# Patient Record
Sex: Female | Born: 1975 | Race: Black or African American | Hispanic: No | Smoking: Never smoker
Health system: Southern US, Community
[De-identification: ages and names within clinical notes are randomized; demographics above are authoritative.]

## PROBLEM LIST (undated history)

## (undated) ENCOUNTER — Inpatient Hospital Stay (HOSPITAL_COMMUNITY): Payer: BLUE CROSS/BLUE SHIELD

## (undated) DIAGNOSIS — I10 Essential (primary) hypertension: Secondary | ICD-10-CM

## (undated) DIAGNOSIS — R7303 Prediabetes: Secondary | ICD-10-CM

## (undated) HISTORY — DX: Morbid (severe) obesity due to excess calories: E66.01

## (undated) HISTORY — DX: Prediabetes: R73.03

## (undated) HISTORY — PX: DILATION AND CURETTAGE, DIAGNOSTIC / THERAPEUTIC: SUR384

## (undated) HISTORY — PX: COLONOSCOPY: SHX174

---

## 1998-05-12 ENCOUNTER — Emergency Department (HOSPITAL_COMMUNITY): Admission: EM | Admit: 1998-05-12 | Discharge: 1998-05-12 | Payer: Self-pay | Admitting: Emergency Medicine

## 1998-09-24 ENCOUNTER — Other Ambulatory Visit: Admission: RE | Admit: 1998-09-24 | Discharge: 1998-09-24 | Payer: Self-pay | Admitting: Obstetrics

## 1999-03-04 ENCOUNTER — Other Ambulatory Visit: Admission: RE | Admit: 1999-03-04 | Discharge: 1999-03-04 | Payer: Self-pay | Admitting: Obstetrics

## 1999-08-02 ENCOUNTER — Inpatient Hospital Stay (HOSPITAL_COMMUNITY): Admission: AD | Admit: 1999-08-02 | Discharge: 1999-08-02 | Payer: Self-pay | Admitting: Obstetrics

## 1999-09-11 ENCOUNTER — Inpatient Hospital Stay (HOSPITAL_COMMUNITY): Admission: AD | Admit: 1999-09-11 | Discharge: 1999-09-11 | Payer: Self-pay | Admitting: Obstetrics

## 1999-09-12 ENCOUNTER — Inpatient Hospital Stay (HOSPITAL_COMMUNITY): Admission: AD | Admit: 1999-09-12 | Discharge: 1999-09-15 | Payer: Self-pay | Admitting: Obstetrics

## 2004-06-04 ENCOUNTER — Ambulatory Visit (HOSPITAL_COMMUNITY): Admission: AD | Admit: 2004-06-04 | Discharge: 2004-06-04 | Payer: Self-pay | Admitting: Obstetrics and Gynecology

## 2004-06-04 ENCOUNTER — Encounter (INDEPENDENT_AMBULATORY_CARE_PROVIDER_SITE_OTHER): Payer: Self-pay | Admitting: Specialist

## 2004-12-05 HISTORY — PX: TUBAL LIGATION: SHX77

## 2004-12-18 ENCOUNTER — Inpatient Hospital Stay (HOSPITAL_COMMUNITY): Admission: AD | Admit: 2004-12-18 | Discharge: 2004-12-18 | Payer: Self-pay | Admitting: Obstetrics and Gynecology

## 2005-01-27 ENCOUNTER — Other Ambulatory Visit: Admission: RE | Admit: 2005-01-27 | Discharge: 2005-01-27 | Payer: Self-pay | Admitting: Obstetrics and Gynecology

## 2005-08-12 ENCOUNTER — Inpatient Hospital Stay (HOSPITAL_COMMUNITY): Admission: RE | Admit: 2005-08-12 | Discharge: 2005-08-15 | Payer: Self-pay | Admitting: Obstetrics and Gynecology

## 2005-08-12 ENCOUNTER — Encounter (INDEPENDENT_AMBULATORY_CARE_PROVIDER_SITE_OTHER): Payer: Self-pay | Admitting: Specialist

## 2007-03-17 ENCOUNTER — Emergency Department (HOSPITAL_COMMUNITY): Admission: EM | Admit: 2007-03-17 | Discharge: 2007-03-17 | Payer: Self-pay | Admitting: Family Medicine

## 2007-09-12 ENCOUNTER — Emergency Department (HOSPITAL_COMMUNITY): Admission: EM | Admit: 2007-09-12 | Discharge: 2007-09-12 | Payer: Self-pay | Admitting: Emergency Medicine

## 2010-01-19 ENCOUNTER — Emergency Department (HOSPITAL_COMMUNITY): Admission: EM | Admit: 2010-01-19 | Discharge: 2010-01-19 | Payer: Self-pay | Admitting: Family Medicine

## 2010-10-04 ENCOUNTER — Encounter (INDEPENDENT_AMBULATORY_CARE_PROVIDER_SITE_OTHER): Payer: Self-pay | Admitting: *Deleted

## 2010-10-04 LAB — CONVERTED CEMR LAB
AST: 13 units/L (ref 0–37)
Alkaline Phosphatase: 71 units/L (ref 39–117)
BUN: 8 mg/dL (ref 6–23)
Basophils Absolute: 0 10*3/uL (ref 0.0–0.1)
CO2: 27 meq/L (ref 19–32)
Chloride: 104 meq/L (ref 96–112)
Eosinophils Absolute: 0.3 10*3/uL (ref 0.0–0.7)
Eosinophils Relative: 3 % (ref 0–5)
Glucose, Bld: 86 mg/dL (ref 70–99)
Hemoglobin: 13.6 g/dL (ref 12.0–15.0)
LDL Cholesterol: 85 mg/dL (ref 0–99)
Lymphs Abs: 3.2 10*3/uL (ref 0.7–4.0)
MCHC: 35.4 g/dL (ref 30.0–36.0)
Monocytes Relative: 9 % (ref 3–12)
Neutro Abs: 7.2 10*3/uL (ref 1.7–7.7)
RDW: 14.3 % (ref 11.5–15.5)
Sodium: 139 meq/L (ref 135–145)
TSH: 1.31 microintl units/mL (ref 0.350–4.500)
Total Protein: 7.3 g/dL (ref 6.0–8.3)
Triglycerides: 78 mg/dL (ref <150)

## 2010-11-08 ENCOUNTER — Encounter (INDEPENDENT_AMBULATORY_CARE_PROVIDER_SITE_OTHER): Payer: Self-pay | Admitting: *Deleted

## 2011-02-23 LAB — POCT I-STAT, CHEM 8
BUN: 6 mg/dL (ref 6–23)
Calcium, Ion: 1.2 mmol/L (ref 1.12–1.32)
Chloride: 105 mEq/L (ref 96–112)
HCT: 44 % (ref 36.0–46.0)
Hemoglobin: 15 g/dL (ref 12.0–15.0)
TCO2: 29 mmol/L (ref 0–100)

## 2011-04-22 NOTE — Discharge Summary (Signed)
Monica Brennan, Monica Brennan NO.:  [id number]   MEDICAL RECORD NO.:  [id number]          PATIENT TYPE:  INP   LOCATION:  9102                          FACILITY:  [facility]   PHYSICIAN:  Malva Limes, M.D.    DATE OF BIRTH:  1976-06-29   DATE OF ADMISSION:  08/12/2005  DATE OF DISCHARGE:  08/15/2005                                 DISCHARGE SUMMARY   FINAL DIAGNOSIS:  Intrauterine pregnancy at term with mild preeclampsia,  history of previous cesarean sections x2, desires permanent sterilization,  and obesity.   PROCEDURE:  Repeat low transverse cesarean section and P[place] tubal  sterilization procedure. Surgeon was Dr. Annamaria Helling.   COMPLICATIONS:  None.   HISTORY OF PRESENT ILLNESS:  This 35 year old gravida 4, para 2, presents to  the office on September 8 with findings consistent with mild preeclampsia.  The patient had a repeat cesarean section scheduled on September 13, but  because of her mild preeclampsia she was admitted at this time. Labs were  ordered and she was taken to the operating room for cesarean section at this  time.   HOSPITAL COURSE:  The patient was taken to the operating room on August 12, 2005 by Dr. Annamaria Helling where a repeat low transverse cesarean section  was performed with the delivery of a 6 pounds 11 ounces female infant with  Apgars of 9 and 9. At this point [facility] tubal sterilization was performed  for permanent sterilization. The patient's delivery and tubal without  complications. Her postoperative course was benign without any significant  fevers. She was felt ready for discharge on postoperative day #3. She was  sent home on a regular diet, told to decrease activities, told to continue  her prenatal vitamins, was given Tylox one to two every 4 hours as needed  for pain, was to follow up in the office 4 weeks. The patient was also to  call with any headache, blurred vision, or any changes in her status.   LABS ON DISCHARGE:  The  patient had a hemoglobin of 10.6, white blood cell  count of 11.9 and platelets of 202,000. The patient's PIH labs were within  normal range.      Monica Brennan, P.A.-C.    ______________________________  Malva Limes, M.D.    MB/MEDQ  D:  09/28/2005  T:  09/28/2005  Job:  [id number]

## 2011-04-22 NOTE — Op Note (Signed)
Monica Brennan, Monica Brennan             ACCOUNT NO.:  [id number]   MEDICAL RECORD NO.:  [id number]          PATIENT TYPE:  INP   LOCATION:  9102                          [facility]ILITY[facility]  [facility]   PHYSICIAN:  [name]. [name], M.D.   DATE OF BIRTH:  1976/08/02   DATE OF PROCEDURE:  08/12/2005  DATE OF DISCHARGE:                                 OPERATIVE REPORT   The patient is age 35.   PREOPERATIVE DIAGNOSES:  1.  Previous C-section x2 term pregnancy.  2.  Mild preeclampsia.  3.  Desire for sterilization.  4.  Obesity.   POSTOPERATIVE DIAGNOSES:  1.  Previous C-section x2 term pregnancy.  2.  Mild preeclampsia.  3.  Desire for sterilization.  4.  Obesity.  5.  Delivery of viable 6 pound 11 ounces female infant, Apgar's 9 and 9.  6.  Pathology pending on segments of each fallopian tube.   PROCEDURE:  Repeat low transverse cesarean section and Pomeroy tubal  sterilization.   SURGEON:  [name]. [name], M.D.   ANESTHESIA:  Spinal, Dr. [name], M.D.   HISTORY:  This 35 year old gravida 4, para 2 presented to the office today  with findings consistent with mild preeclampsia. She was scheduled for  repeat cesarean section on September 13 and a tubal sterilization at that  time as well. Because of her mild preeclampsia, she was admitted, had  laboratory studies done and is now being taken to the operating room for  delivery by repeat cesarean section and a tubal sterilization procedure  according to her wishes.   The patient understands that a sterilization procedure is meant to be 100%  permanent but unfortunately is not 100% perfect as subsequent pregnancy can  result.   The patient was taken to the operating room where spinal was placed by Dr.  Tacy Dura without difficulty. She was then placed in supine position slightly  tilted to the left. Her abdomen was taped up to the anesthesia guard to  elevate the large panniculus - the patient is of short stature and obese.  Thereafter she  was prepped and draped in the usual fashion and a Foley  catheter was placed as part of the prep.   At this time, good anesthetic levels were documented and procedure was  begun. A Pfannenstiel incision was made and with minimal difficulty  dissected down to and through the fascia with hemostasis created at each  layer. A subfascial space was created inferiorly and superiorly, muscles  separated in the midline, peritoneum identified and entered appropriately  with care taken to avoid the bowel superiorly and the bladder inferiorly. At  this time, the vesicouterine peritoneum was incised in a low transverse  fashion and pushed off the lower uterine segment with ease. Sharp incision  to the uterus with Metzenbaum scissors were then carried out in low  transverse fashion and extended laterally with the fingers. Amniotomy was  produced with production of clear fluid and thereafter a viable female  infant was delivered from a vertex position. Infant cried spontaneously at  once. It was found to weigh 6 pounds 11 ounces.  Apgar's were assigned at 9  and 9. After the cord was clamped and cut, the infant was passed off to the  awaiting team and thereafter taken to the regular nursery in good condition.   After the placenta was delivered intact, the uterus was exteriorized and  rendered free of any remaining products conception. Good uterine  contractility was __________ slowly given intravenous Pitocin and manual  stimulation. 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   Both tubes and ovaries appeared normal and at this time attention was turned  to closing the uterine incision which was carried out using #1 Vicryl in a  running locking fashion, oversewn with several #1 Vicryl in a figure-of-  eight fashion. Hemostasis was now adequate in the uterine incision area. At  this time, attention was turned to each fallopian tube and according to  the  patient's wishes, a Pomeroy tubal sterilization procedure was carried out.   A Babcock clamp was used to elevate a segment in the mid portion of the  right fallopian tube and a free tie of  #1 plain catgut suture tied down  about the knuckle that was elevated and the knuckle was excised and sent to  pathology. Hemostasis was adequate and a similar procedure was carried out  on the left fallopian tube.   At this time, the tubal sites were hemostatic as was the uterine incision.  The uterus was well contracted. The abdomen was lavaged of all free blood  and clot and the uterus was replaced into the abdominal cavity and after all  counts were noted to be correct and no foreign bodies were noted to be  remaining in the abdominal cavity, closure of the abdomen was carried out in  layers. The abdominal peritoneum was closed with #0 Vicryl in a running  fashion and muscle secured with same. Meticulous attention was given to  subfascial hemostasis, and indeed several figure-of-eight #0 Vicryl sutures  were placed in the muscle for hemostasis. Once adequate hemostasis was  achieved, closure of the fascia was carried out using #0 Vicryl from angle  to midline bilaterally. Subcutaneous tissues were hemostatic and staples  were used to close the skin. A sterile pressure dressing was applied. The  patient at this time was transported to the recovery room in satisfactory  condition having tolerated the procedure well. Estimated blood loss 500 mL.  All counts correct x2. Pathologic specimen consisted of a segment of each  fallopian tube.   In summary, this 35 year old gravida 4, para 2, 2 previous cesarean  sections, was scheduled for an elective repeat cesarean section on September  13, but presented to the office today for a routine visit and was noted to  have mild preeclampsia. After normal labs were obtained, a decision was made to proceed with delivering her today rather than waiting until  September 13.  She was delivered of a viable 6 pound 11 ounce female infant with Apgar's of  9 and 9 and a tubal sterilization procedure was carried out according to her  wishes.   The patient was taken to the recovery room in satisfactory condition after  tolerating the procedure well. Estimated blood loss 500 mL. All counts  correct x2.      [name]. [name], M.D.  Electronically Signed     RMW/MEDQ  D:  08/12/2005  T:  08/13/2005  Job:  [id number]

## 2011-04-22 NOTE — Op Note (Signed)
NAME:  Monica Brennan, Monica Brennan NO.:  [id number]   MEDICAL RECORD NO.:  [id number]                   PATIENT TYPE:  MAT   LOCATION:  MATC                                 [facility]ACILITY:  [facility]   PHYSICIAN:  Malva Limes, M.D.                 DATE OF BIRTH:  14-Aug-1976   DATE OF PROCEDURE:  06/04/2004  DATE OF DISCHARGE:                                 OPERATIVE REPORT   PREOPERATIVE DIAGNOSIS:  Incomplete abortion at 37 weeks' estimated  gestational age.   POSTOPERATIVE DIAGNOSIS:  Incomplete abortion at 72 weeks' estimated  gestational age.   PROCEDURE:  Dilation and curettage.   SURGEON:  Malva Limes, M.D.   ANESTHESIA:  MAC with paracervical block.   ESTIMATED BLOOD LOSS:  30 mL.   ANTIBIOTICS:  Ancef 1 g.   COMPLICATIONS:  None.   SPECIMENS:  Products of conception sent to pathology.   PROCEDURE:  The patient was taken to the operating room, where she was  placed in the dorsal lithotomy position.  She was prepped with Hibiclens and  MAC anesthesia was administered.  She was then draped in the usual fashion  for this procedure.  Pelvic exam was then performed, which revealed a uterus  approximately 13-14 weeks in size.  The patient had a sterile speculum  placed in the vagina, 20 mL of 1% lidocaine was used for a paracervical  block.  The cervix was then grasped with a single-tooth tenaculum.  The  cervix was dilated to a 83 [place].  The 10 mm suction cannula was placed in  the uterine cavity and products of conception withdrawn.  Sharp curettage  was then performed followed by a repeat suction.  The patient tolerated the  procedure well.  She was taken to the recovery room in stable condition.  Instrument and lap counts were correct x1.  The patient's blood type was Rh  positive, and therefore no RhoGAM was indicated.  The patient will be  discharged to home on Keflex 500 mg q.i.d. for two days and Darvocet to take  p.r.n.  She is instructed to follow  up in the office in two weeks.                                               Malva Limes, M.D.    MA/MEDQ  D:  06/04/2004  T:  06/07/2004  Job:  [id number]

## 2011-04-22 NOTE — H&P (Signed)
[facility]  Patient:    Monica Brennan                     MRN: [id number] Adm. Date:  [id number] Attending:  Venita Sheffield                         History and Physical  HISTORY OF PRESENT ILLNESS:   The patient is a 35 year old, gravida 2, para 1, whose estimated date of confinement is September 12, 1999.  The patients previous delivery was by way of cesarean section.  Her last infant was in 1998 weighing  pounds 3 ounces at 42 weeks.  The patient was admitted in active labor during which time her labor pattern stalled and was augmented with Pitocin.  After Pitocin augmentation for several hours and no progress was made, the decision was to proceed with a repeat cesarean section.  PHYSICAL EXAMINATION:  GENERAL:                      A well-developed, well-nourished, gravid female in labor-type distress.  HEENT:                        Within normal limits.  NECK:                         Supple.  BREASTS:                      Without masses, tenderness, or discharge.  LUNGS:                        Clear to auscultation and percussion.  HEART:                        Normal sinus rhythm without murmurs, rubs, or gallops.  ABDOMEN:                      Term gravid with fetal heart beats in the left lower quadrant.  EXTREMITIES: NEUROLOGICAL:    Within normal limits.  PELVIC:                       Revealed external genitalia and BUS to be within normal limits.  The vagina is clear.  The cervix is approximately 4 to 5 cm dilated, 75% effaced, and vertex at -1 station.  ADMISSION DIAGNOSES:          1. Intrauterine pregnancy at term.                               2. Previous cesarean section.                               3. Failed vaginal birth after cesarean section.  PLAN:                         Repeat cesarean section. DD:  09/12/99 TD:  09/13/99 Job: [id number]

## 2013-03-08 ENCOUNTER — Encounter: Payer: Self-pay | Admitting: General Surgery

## 2013-03-11 ENCOUNTER — Emergency Department (HOSPITAL_COMMUNITY)
Admission: EM | Admit: 2013-03-11 | Discharge: 2013-03-11 | Disposition: A | Discharge status: Home / Self Care | Payer: Self-pay | Attending: Emergency Medicine | Admitting: Emergency Medicine

## 2013-03-11 ENCOUNTER — Encounter (HOSPITAL_COMMUNITY): Payer: Self-pay | Admitting: Emergency Medicine

## 2013-03-11 DIAGNOSIS — K089 Disorder of teeth and supporting structures, unspecified: Secondary | ICD-10-CM | POA: Insufficient documentation

## 2013-03-11 DIAGNOSIS — N898 Other specified noninflammatory disorders of vagina: Secondary | ICD-10-CM | POA: Insufficient documentation

## 2013-03-11 DIAGNOSIS — B9689 Other specified bacterial agents as the cause of diseases classified elsewhere: Secondary | ICD-10-CM

## 2013-03-11 DIAGNOSIS — A499 Bacterial infection, unspecified: Secondary | ICD-10-CM | POA: Insufficient documentation

## 2013-03-11 DIAGNOSIS — R51 Headache: Secondary | ICD-10-CM

## 2013-03-11 DIAGNOSIS — I10 Essential (primary) hypertension: Secondary | ICD-10-CM | POA: Insufficient documentation

## 2013-03-11 DIAGNOSIS — K029 Dental caries, unspecified: Secondary | ICD-10-CM | POA: Insufficient documentation

## 2013-03-11 DIAGNOSIS — H53149 Visual discomfort, unspecified: Secondary | ICD-10-CM | POA: Insufficient documentation

## 2013-03-11 DIAGNOSIS — N760 Acute vaginitis: Secondary | ICD-10-CM | POA: Insufficient documentation

## 2013-03-11 DIAGNOSIS — K0889 Other specified disorders of teeth and supporting structures: Secondary | ICD-10-CM

## 2013-03-11 HISTORY — DX: Essential (primary) hypertension: I10

## 2013-03-11 LAB — WET PREP, GENITAL
Trich, Wet Prep: NONE SEEN
Yeast Wet Prep HPF POC: NONE SEEN

## 2013-03-11 MED ORDER — OXYCODONE-ACETAMINOPHEN 5-325 MG PO TABS: 2.0000 | ORAL_TABLET | Freq: Once | ORAL | Status: DC

## 2013-03-11 MED ORDER — PENICILLIN V POTASSIUM 500 MG PO TABS
500.0000 mg | ORAL_TABLET | Freq: Once | ORAL | Status: AC
Start: 2013-03-11 — End: 2013-03-11
  Administered 2013-03-11: 500 mg via ORAL
  Filled 2013-03-11: qty 1

## 2013-03-11 MED ORDER — METRONIDAZOLE 500 MG PO TABS: 500.0000 mg | ORAL_TABLET | Freq: Two times a day (BID) | ORAL | Status: DC

## 2013-03-11 MED ORDER — PENICILLIN V POTASSIUM 500 MG PO TABS: 500.0000 mg | ORAL_TABLET | Freq: Four times a day (QID) | ORAL | Status: AC

## 2013-03-11 MED ORDER — METOCLOPRAMIDE HCL 5 MG/ML IJ SOLN
10.0000 mg | Freq: Once | INTRAMUSCULAR | Status: AC
  Administered 2013-03-11: 10 mg via INTRAMUSCULAR
  Filled 2013-03-11: qty 2

## 2013-03-11 MED ORDER — HYDROCODONE-ACETAMINOPHEN 5-325 MG PO TABS: 1.0000 | ORAL_TABLET | ORAL | Status: DC | PRN

## 2013-03-11 MED ORDER — OXYCODONE-ACETAMINOPHEN 5-325 MG PO TABS
1.0000 | ORAL_TABLET | Freq: Once | ORAL | Status: AC
  Administered 2013-03-11: 1 via ORAL
  Filled 2013-03-11: qty 1

## 2013-03-11 NOTE — ED Notes (Signed)
Patient c/o dizziness and HTN (patient has been seen for previously but is not on any medications).  Patient also c/o dental pain.

## 2013-03-11 NOTE — ED Notes (Signed)
HQI:ON62<XB> Expected date:<BR> Expected time:<BR> Means of arrival:<BR> Comments:<BR> Gaynell Face

## 2013-03-11 NOTE — ED Notes (Signed)
Pt c/o headache and htn, as well as dental pin and vaginal discharge and itchiness. Sts no bp medications, reports dizziness and nausea with headache. Pt sts told by pcp no need for bp meds d/t "htn running in her family"

## 2013-03-11 NOTE — ED Provider Notes (Signed)
History     CSN: [id number]  Arrival date & time 03/11/13  1439   First MD Initiated Contact with Patient 03/11/13 1511      Chief Complaint  Patient presents with  . Dental Pain  . Hypertension  . Vaginal Discharge    (Consider location/radiation/quality/duration/timing/severity/associated sxs/prior treatment) HPI The patient reports developing a headache over the past 2-3 days.  She has photophobia without phonophobia.  No recent falls or trauma or injury to her head.  She has not use anticoagulants.  This was not an acute onset headache.  She denies fevers or chills.  She denies weakness of her upper lower extremities.  She also states to 3 weeks of increasing left upper second molar pain.  She has a known dental cavity here but she has not been able to follow up with a dentist.  She denies chest pain shortness of breath.  No abdominal pain.  She's had no new vaginal discharge over the past 2-3 days without vaginal itching.  No lower abdominal pain.  No new sexual contacts.  Her symptoms are mild to moderate in severity.   Past Medical History  Diagnosis Date  . Hypertension     No past surgical history on file.  No family history on file.  History  Substance Use Topics  . Smoking status: Never Smoker   . Smokeless tobacco: Not on file  . Alcohol Use: No    OB History   Grav Para Term Preterm Abortions TAB SAB Ect Mult Living                  Review of Systems  Genitourinary: Positive for vaginal discharge.  All other systems reviewed and are negative.    Allergies  Review of patient's allergies indicates no known allergies.  Home Medications   Current Outpatient Rx  Name  Route  Sig  Dispense  Refill  . acetaminophen (TYLENOL) 500 MG tablet   Oral   Take 500 mg by mouth every 6 (six) hours as needed for pain.         Marland Kitchen HYDROcodone-acetaminophen (NORCO/VICODIN) 5-325 MG per tablet   Oral   Take 1 tablet by mouth every 4 (four) hours as needed for  pain.   15 tablet   0   . metroNIDAZOLE (FLAGYL) 500 MG tablet   Oral   Take 1 tablet (500 mg total) by mouth 2 (two) times daily.   14 tablet   0   . penicillin v potassium (VEETID) 500 MG tablet   Oral   Take 1 tablet (500 mg total) by mouth 4 (four) times daily.   40 tablet   0     BP 160/97  Pulse 70  Temp(Src) 98 F (36.7 C) (Oral)  Resp 16  SpO2 99%  LMP 03/03/2013  Physical Exam  Nursing note and vitals reviewed. Constitutional: She is oriented to person, place, and time. She appears well-developed and well-nourished. No distress.  HENT:  Head: Normocephalic and atraumatic.  Dental decay of her left upper second molar without gingival swelling or fluctuance.  Tolerating secretions.  Oral airway patent.  Eyes: EOM are normal. Pupils are equal, round, and reactive to light.  Neck: Normal range of motion.  Cardiovascular: Normal rate, regular rhythm and normal heart sounds.   Pulmonary/Chest: Effort normal and breath sounds normal.  Abdominal: Soft. She exhibits no distension. There is no tenderness.  Genitourinary:  Normal external genitalia.  No cervical motion tenderness.  Small/scant vaginal discharge  with some odor.  No adnexal masses or fullness.  Musculoskeletal: Normal range of motion.  Neurological: She is alert and oriented to person, place, and time.  5/5 strength in major muscle groups of  bilateral upper and lower extremities. Speech normal. No facial asymetry.   Skin: Skin is warm and dry.  Psychiatric: She has a normal mood and affect. Judgment normal.    ED Course  Procedures (including critical care time)  Labs Reviewed  WET PREP, GENITAL - Abnormal; Notable for the following:    Clue Cells Wet Prep HPF POC RARE (*)    All other components within normal limits  GC/CHLAMYDIA PROBE AMP   No results found.   1. Headache   2. Pain, dental   3. BV (bacterial vaginosis)       MDM  The patient's headache is improved with pain medicine  and Reglan.  Abdomen is benign.  Normal neurologic exam.  Evidence of dental infection that'll be treated with penicillin, pain medicine, temporal for all.  Patient with evidence of bacterial vaginosis.  Home with Flagyl.        Lyanne Co, MD 03/11/13 [phone]

## 2013-03-11 NOTE — Progress Notes (Signed)
This encounter was created in error - please disregard.

## 2013-03-11 NOTE — ED Notes (Signed)
Dr Patria Mane made aware pt's b/p at discharge was 170/100

## 2013-11-30 ENCOUNTER — Encounter (HOSPITAL_COMMUNITY): Payer: Self-pay | Admitting: Emergency Medicine

## 2013-11-30 ENCOUNTER — Emergency Department (HOSPITAL_COMMUNITY)
Admission: EM | Admit: 2013-11-30 | Discharge: 2013-11-30 | Disposition: A | Discharge status: Home / Self Care | Payer: Medicaid Other | Attending: Emergency Medicine | Admitting: Emergency Medicine

## 2013-11-30 DIAGNOSIS — R05 Cough: Secondary | ICD-10-CM | POA: Insufficient documentation

## 2013-11-30 DIAGNOSIS — I10 Essential (primary) hypertension: Secondary | ICD-10-CM | POA: Insufficient documentation

## 2013-11-30 DIAGNOSIS — R062 Wheezing: Secondary | ICD-10-CM | POA: Insufficient documentation

## 2013-11-30 DIAGNOSIS — R059 Cough, unspecified: Secondary | ICD-10-CM | POA: Insufficient documentation

## 2013-11-30 DIAGNOSIS — R6883 Chills (without fever): Secondary | ICD-10-CM | POA: Insufficient documentation

## 2013-11-30 MED ORDER — HYDROCODONE-HOMATROPINE 5-1.5 MG/5ML PO SYRP: 5.0000 mL | ORAL_SOLUTION | Freq: Four times a day (QID) | ORAL | Status: DC | PRN

## 2013-11-30 NOTE — ED Notes (Signed)
MD made aware of admit/discharge BP.  No orders received.  Pt discharged.

## 2013-11-30 NOTE — ED Notes (Signed)
Patient reports cough with wheezing and chills.  Intermittent chest pain for 10 days. She is also reporting some cold sweats.  She is also concerned about a spot on her left breast.  Patient with no s/sx of distress.  Denies any n/v/d.  Patient has had motrin today for pain.

## 2013-12-01 NOTE — ED Provider Notes (Signed)
CSN: [id number]     Arrival date & time 11/30/13  1434 History   First MD Initiated Contact with Patient 11/30/13 2003     Chief Complaint  Patient presents with  . Cough  . Wheezing  . Chills    Patient is a 37 y.o. female presenting with cough and wheezing. The history is provided by the patient.  Cough Cough characteristics:  Productive Sputum characteristics:  Green Severity:  Moderate Onset quality:  Gradual Duration:  1 week Timing:  Intermittent Progression:  Unchanged Relieved by:  Beta-agonist inhaler Worsened by:  Nothing tried Associated symptoms: wheezing   Associated symptoms: no fever   Wheezing Associated symptoms: cough   Associated symptoms: no fever    Pt reports she has had cough for past week She reports chest wall pain from coughing.   She denies any significant SOB at this time She has used her child's albuterol at home with some relief  She also requests to have a "Spot" on her left breast checked  Past Medical History  Diagnosis Date  . Hypertension    History reviewed. No pertinent past surgical history. No family history on file. History  Substance Use Topics  . Smoking status: Never Smoker   . Smokeless tobacco: Not on file  . Alcohol Use: No   OB History   Grav Para Term Preterm Abortions TAB SAB Ect Mult Living                 Review of Systems  Constitutional: Negative for fever.  Respiratory: Positive for cough and wheezing.   Cardiovascular:       CP with cough   All other systems reviewed and are negative.    Allergies  Review of patient's allergies indicates no known allergies.  Home Medications   Current Outpatient Rx  Name  Route  Sig  Dispense  Refill  . HYDROcodone-homatropine (HYCODAN) 5-1.5 MG/5ML syrup   Oral   Take 5 mLs by mouth every 6 (six) hours as needed for cough.   120 mL   0    BP 156/105  Pulse 66  Temp(Src) 98.2 F (36.8 C) (Oral)  Resp 20  SpO2 99% Physical Exam CONSTITUTIONAL: Well  developed/well nourished HEAD: Normocephalic/atraumatic EYES: EOMI/PERRL ENMT: Mucous membranes moist NECK: supple no meningeal signs SPINE:entire spine nontender CV: S1/S2 noted, no murmurs/rubs/gallops noted Chest - left breast - no mass/erythema noted.  Family at bedside at patient request LUNGS: Lungs are clear to auscultation bilaterally, no apparent distress ABDOMEN: soft, nontender, no rebound or guarding GU:no cva tenderness NEURO: Pt is awake/alert, moves all extremitiesx4 EXTREMITIES: pulses normal, full ROM SKIN: warm, color normal PSYCH: no abnormalities of mood noted  ED Course  Procedures  Labs Review Labs Reviewed - No data to display Imaging Review No results found.  EKG Interpretation    Date/Time:  Saturday November 30 2013 15:28:40 EST Ventricular Rate:  67 PR Interval:  174 QRS Duration: 78 QT Interval:  398 QTC Calculation: 420 R Axis:   53 Text Interpretation:  Normal sinus rhythm Normal ECG Confirmed by Bebe Shaggy  MD, Shariff Lasky [phone]) on 11/30/2013 8:07:50 PM            MDM   1. Cough    Nursing notes including past medical history and social history reviewed and considered in documentation   Pt well appearing, no distress, lung sounds clear.  Pt likely has viral illness that is resolving She requests meds for her cough.   Also, I  advised her need for f/u breast ultrasound or mammogram for further evaluation of any concern for breast lesions, pt agreeable    Joya Gaskins, MD 12/01/13 [phone]

## 2014-07-31 ENCOUNTER — Other Ambulatory Visit: Payer: Self-pay | Admitting: Obstetrics and Gynecology

## 2014-07-31 DIAGNOSIS — Z1231 Encounter for screening mammogram for malignant neoplasm of breast: Secondary | ICD-10-CM

## 2014-08-12 ENCOUNTER — Ambulatory Visit (HOSPITAL_COMMUNITY)
Admission: RE | Admit: 2014-08-12 | Discharge: 2014-08-12 | Disposition: A | Discharge status: Home / Self Care | Payer: Medicaid Other | Source: Ambulatory Visit | Attending: Obstetrics and Gynecology | Admitting: Obstetrics and Gynecology

## 2014-08-12 ENCOUNTER — Encounter (HOSPITAL_COMMUNITY): Payer: Self-pay

## 2014-08-12 VITALS — BP 172/110 | Temp 98.6°F | Ht 66.0 in | Wt 232.4 lb

## 2014-08-12 DIAGNOSIS — Z1231 Encounter for screening mammogram for malignant neoplasm of breast: Secondary | ICD-10-CM

## 2014-08-12 DIAGNOSIS — N898 Other specified noninflammatory disorders of vagina: Secondary | ICD-10-CM

## 2014-08-12 DIAGNOSIS — Z01419 Encounter for gynecological examination (general) (routine) without abnormal findings: Secondary | ICD-10-CM

## 2014-08-13 ENCOUNTER — Telehealth (HOSPITAL_COMMUNITY): Payer: Self-pay | Admitting: *Deleted

## 2014-08-13 ENCOUNTER — Other Ambulatory Visit: Payer: Self-pay | Admitting: Obstetrics and Gynecology

## 2014-08-13 MED ORDER — NYSTATIN 100000 UNIT/GM EX CREA: 1.0000 "application " | TOPICAL_CREAM | Freq: Two times a day (BID) | CUTANEOUS | Status: DC

## 2014-08-13 NOTE — Patient Instructions (Signed)
Explained to Monica Brennan that [facility] will cover Pap smears and co-testing every 5 years unless has a history of abnormal Pap smears. Let patient know will follow up with her within the next couple weeks with results of Pap smear and wet prep by phone. Told patient that the [place] will follow-up with her in regards to her mammogram results by either letter or phone. Monica Brennan verbalized understanding.

## 2014-08-13 NOTE — Progress Notes (Signed)
Complaints of rash on bilateral breast x 1 year that is itchy.  Pap Smear:  Completed Pap smear today. Last Pap smear was 05/05/2009 and normal. Per patient has no history of an abnormal Pap smear. Last Pap smear result is in EPIC.  Physical exam: Breasts Breasts symmetrical. Bilateral rash like area on breast that's appearance is consistent with yeast. Patient complains of areas itching. No nipple retraction bilateral breasts. No nipple discharge bilateral breasts. No lymphadenopathy. No lumps palpated bilateral breasts. No complaints of pain or tenderness on exam. Patient escorted to mammography for a screening mammogram.         Pelvic/Bimanual   Ext Genitalia No lesions, no swelling and no discharge observed on external genitalia.         Vagina Vagina pink and normal texture. No lesions and thick white vaginal discharge observed with positive whiff test. Wet Prep completed.      Cervix Cervix is present. Cervix pink and of normal texture. Small amount of thick white discharge observed on cervix.      Uterus Uterus is present and palpable. Uterus in normal position and normal size.       Adnexae Bilateral ovaries present and unable to palpate. No tenderness on palpation.        Rectovaginal No rectal exam completed today since patient had no rectal complaints. Small hemorrhoid observed on rectal area.

## 2014-08-13 NOTE — Telephone Encounter (Signed)
Called patient to let her know that a nystatin prescription has been sent to the pharmacy for the yeast on her breasts. Let her know that [facility] doesn't cover the cost of it.  Patient verbalized understanding. Patient informed me that her cell phone has been cut off until Monday and to call her job.

## 2014-08-15 LAB — CYTOLOGY - PAP

## 2014-08-22 ENCOUNTER — Encounter (HOSPITAL_COMMUNITY): Payer: Self-pay

## 2014-08-22 ENCOUNTER — Other Ambulatory Visit (HOSPITAL_COMMUNITY): Payer: Self-pay | Admitting: *Deleted

## 2014-08-22 ENCOUNTER — Telehealth (HOSPITAL_COMMUNITY): Payer: Self-pay | Admitting: *Deleted

## 2014-08-22 DIAGNOSIS — B379 Candidiasis, unspecified: Secondary | ICD-10-CM

## 2014-08-22 MED ORDER — FLUCONAZOLE 150 MG PO TABS: 150.0000 mg | ORAL_TABLET | Freq: Once | ORAL | Status: DC

## 2014-08-22 NOTE — Progress Notes (Signed)
Telephoned patient at home # and left message to return call to [facility] 

## 2014-08-22 NOTE — Telephone Encounter (Signed)
Telephoned patient at work # and discussed negative pap smear results. HPV was negative. Next pap due in 5 years. Advised wet prep did show yeast infection and med was called into patients's pharmacy. Patient voiced understanding.

## 2014-08-28 ENCOUNTER — Telehealth: Payer: Self-pay

## 2014-08-28 NOTE — Telephone Encounter (Signed)
Left message to remind of appointment for 8:45 AM at cancer center.

## 2014-08-29 ENCOUNTER — Telehealth: Payer: Self-pay

## 2014-08-29 ENCOUNTER — Other Ambulatory Visit: Payer: Medicaid Other

## 2014-08-29 ENCOUNTER — Ambulatory Visit (HOSPITAL_BASED_OUTPATIENT_CLINIC_OR_DEPARTMENT_OTHER): Payer: Medicaid Other

## 2014-08-29 VITALS — BP 163/106 | HR 72 | Temp 98.4°F | Resp 18 | Ht 63.0 in | Wt 236.3 lb

## 2014-08-29 DIAGNOSIS — Z0000 Encounter for general adult medical examination without abnormal findings: Secondary | ICD-10-CM

## 2014-08-29 LAB — LIPID PANEL
CHOL/HDL RATIO: 3.4 ratio
CHOLESTEROL: 141 mg/dL (ref 0–200)
HDL: 41 mg/dL (ref >39)
LDL Cholesterol: 86 mg/dL (ref 0–99)
Triglycerides: 69 mg/dL (ref <150)
VLDL: 14 mg/dL (ref 0–40)

## 2014-08-29 LAB — GLUCOSE (CC13): GLUCOSE: 94 mg/dL (ref 70–140)

## 2014-08-29 LAB — HEMOGLOBIN A1C
HEMOGLOBIN A1C: 5.3 % (ref <5.7)
Mean Plasma Glucose: 105 mg/dL (ref <117)

## 2014-08-29 NOTE — Telephone Encounter (Signed)
Left message about appointment and asked to return call.

## 2014-08-29 NOTE — Progress Notes (Signed)
Patient is a new patient to the [facility] program and is currently a [facility] patient effective 08/12/2014.   Clinical Measurements: Patient is 5 foot 3 inches, weight 236.3 lbs, waist circumference 45 inches, and hip circumference 52.5 inches.   Medical History: Patient has no history of high cholesterol or diabetes.Patient does have a history of hypertension. She is presently not taking any medication and has not taken any since had medicaid when last child born in 2006.Patient states that does have a family history of diabetes Per patient no diagnosed history of coronary heart disease, heart attack, heart failure, stroke/TIA, vascular disease or congenital heart defects.   Blood Pressure, Self-measurement: Patient states that goes to [facility] a couple of times a week and checks blood pressure.  Nutrition Assessment: Patient stated that eats 2 fruits every day. Patient states she eats one to 3  servings of vegetables a day. Per patient does not eats 3 or more ounces of whole grains daily. Patient doesn't eat two or more servings of fish weekly. Patient stated that does not like seafood or any kind of fish and was making a face. Patient states she does drink more than 36 ounces or 450 calories of beverages with added sugars weekly. Patient stated she does watch her salt intake.   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 Per patient no vigorous activity.  Smoking Status: Patient had never smoked and is not around smoke.  Quality of Life Assessment: In assessing patient's quality of life she stated that out of the past 30 days that she has felt her Physical health was good all but 2 days. Patient also stated that in the past 30 days that her mental health is not good including stress, depression and problems with emotions for 14 days. Patient did state that out of the past 30 days she felt her physical or mental health had  not kept her from doing her usual activities including self-care, work or recreation.   Plan: Lab work will be done today including a lipid panel, blood glucose, and Hgb A1C. Will call lab results when they are finished. Will discuss Lifestyle program/ community  Program/ health Coaching ([facility]) when call results. Will obtain doctors appointment at [place]. Patient will use risk modifications that discussed for high blood pressure.

## 2014-08-29 NOTE — Telephone Encounter (Signed)
Called [facility] for appointment for patient with blood pressure in [place] 163/106. Appointment made.

## 2014-08-29 NOTE — Patient Instructions (Signed)
Discussed health assessment with patient. Informed patient that she would need to be followed up for blood pressure. She will be called with results of lab work and we will then discussed any further follow up the patient needs. Patient will implement behavior modifications to help lower BP. Patient verbalized understanding.

## 2014-09-01 ENCOUNTER — Telehealth: Payer: Self-pay

## 2014-09-01 ENCOUNTER — Ambulatory Visit: Payer: Medicaid Other | Admitting: Internal Medicine

## 2014-09-01 NOTE — Telephone Encounter (Signed)
Patient returned call and was informed about appointment. Patient stated that did not receive voice mail from Friday or today. Asked patient to call back if arrangements could not be made for appointment today.

## 2014-09-01 NOTE — Telephone Encounter (Signed)
Called Monica Brennan on [place] to see if she had gotten message. Daycare not aware of appointment but are aware of blood pressure problem. Daycare stated that she would be in at 8:30 AM and they would get her to call me. Daycare stated that they would have to find someone to cover and patient rode the bus and would have to find transportation.

## 2014-09-09 ENCOUNTER — Ambulatory Visit: Payer: Self-pay | Attending: Family Medicine | Admitting: Family Medicine

## 2014-09-09 ENCOUNTER — Encounter: Payer: Self-pay | Admitting: Family Medicine

## 2014-09-09 VITALS — BP 158/112 | HR 70 | Temp 98.3°F | Resp 18 | Ht 63.0 in | Wt 233.0 lb

## 2014-09-09 DIAGNOSIS — I10 Essential (primary) hypertension: Secondary | ICD-10-CM | POA: Insufficient documentation

## 2014-09-09 DIAGNOSIS — R21 Rash and other nonspecific skin eruption: Secondary | ICD-10-CM | POA: Insufficient documentation

## 2014-09-09 DIAGNOSIS — Z23 Encounter for immunization: Secondary | ICD-10-CM

## 2014-09-09 DIAGNOSIS — Z6841 Body Mass Index (BMI) 40.0 and over, adult: Secondary | ICD-10-CM | POA: Insufficient documentation

## 2014-09-09 DIAGNOSIS — L299 Pruritus, unspecified: Secondary | ICD-10-CM | POA: Insufficient documentation

## 2014-09-09 DIAGNOSIS — B354 Tinea corporis: Secondary | ICD-10-CM | POA: Insufficient documentation

## 2014-09-09 MED ORDER — FLUCONAZOLE 150 MG PO TABS: 300.0000 mg | ORAL_TABLET | ORAL | Status: DC

## 2014-09-09 MED ORDER — AMLODIPINE BESYLATE 10 MG PO TABS: 10.0000 mg | ORAL_TABLET | Freq: Every day | ORAL | Status: DC

## 2014-09-09 NOTE — Assessment & Plan Note (Signed)
For tinea corporis: OTC benadryl cream just for itch Do not use steroid Diflucan 300 mg weekly for two weeks, repeat course monthly if needed.

## 2014-09-09 NOTE — Assessment & Plan Note (Signed)
For high BP: Low salt diet Regular exercise  Start Norvasc 10 daily

## 2014-09-09 NOTE — Patient Instructions (Signed)
Ms. Appleman,  Thank you for coming in today. It was a pleasure meeting you. I look forward to being your primary doctor.  1. For high BP: Low salt diet Regular exercise  Start Norvasc 10 daily   2. For tinea corporis: OTC benadryl cream just for itch Do not use steroid Diflucan 300 mg weekly for two weeks, repeat course monthly if needed.   F/u in 2 weeks for RN BP check  F/i in 4 weeks with me  Dr. Adrian Blackwater

## 2014-09-09 NOTE — Progress Notes (Signed)
Establish Care Pt stated has Hx HTN not taking medication

## 2014-09-09 NOTE — Progress Notes (Signed)
   Subjective:    Patient ID: Monica Brennan, female    DOB: 01-Apr-1976, 38 y.o.   MRN: [id number] CC: establish care, HTN   HPI  1. HTN: known history of gestational HTN in 2006.  HA off and on. Blurry vision sometimes. Dizzy episode 3 weeks ago. CP last month. R sided, throbbing.  SOB when she walks sometimes. No swelling.    2. Itchy skin rash: x 2-3 months. Tried son topical steroid cream w/o improvement. On arms. Under breast, nipple. Groin, under pannus.                                                                                                                                                                                                                                                                                            Soc Hx: non smoker  Review of Systems As per HPI     Objective:   Physical Exam BP 158/112  Pulse 70  Temp(Src) 98.3 F (36.8 C) (Oral)  Resp 18  Ht 5\' 3"  (1.6 m)  Wt 233 lb (105.688 kg)  BMI 41.28 kg/m2  SpO2 100%  LMP 08/14/2014 BP Readings from Last 3 Encounters:  09/09/14 158/112  08/29/14 163/106  08/12/14 172/110  General appearance: alert, cooperative and no distress Lungs: clear to auscultation bilaterally Heart: regular rate and rhythm, S1, S2 normal, no murmur, click, rub or gallop Extremities: extremities normal, atraumatic, no cyanosis or edema Skin: circular raised, scaly macules on arms, breast around nipples.      Assessment & Plan:

## 2014-09-10 ENCOUNTER — Telehealth: Payer: Self-pay | Admitting: *Deleted

## 2014-09-10 LAB — COMPLETE METABOLIC PANEL WITH GFR
ALK PHOS: 68 U/L (ref 39–117)
ALT: 13 U/L (ref 0–35)
AST: 13 U/L (ref 0–37)
Albumin: 4.1 g/dL (ref 3.5–5.2)
BUN: 11 mg/dL (ref 6–23)
CO2: 25 mEq/L (ref 19–32)
Calcium: 9.2 mg/dL (ref 8.4–10.5)
Chloride: 105 mEq/L (ref 96–112)
Creat: 0.89 mg/dL (ref 0.50–1.10)
GFR, Est Non African American: 81 mL/min
GLUCOSE: 97 mg/dL (ref 70–99)
Potassium: 5.3 mEq/L (ref 3.5–5.3)
Sodium: 140 mEq/L (ref 135–145)
Total Bilirubin: 0.3 mg/dL (ref 0.2–1.2)
Total Protein: 7.4 g/dL (ref 6.0–8.3)

## 2014-09-10 NOTE — Telephone Encounter (Signed)
Pt aware of lab results 

## 2014-09-10 NOTE — Telephone Encounter (Signed)
Message copied by Betti Cruz on Wed Sep 10, 2014  5:13 PM ------      Message from: Boykin Nearing      Created: Wed Sep 10, 2014  9:29 AM       Normal CMP, normal liver and renal function ------

## 2014-09-10 NOTE — Telephone Encounter (Signed)
Message copied by Betti Cruz on Wed Sep 10, 2014  5:11 PM ------      Message from: Boykin Nearing      Created: Wed Sep 10, 2014  9:29 AM       Normal CMP, normal liver and renal function ------

## 2014-09-12 ENCOUNTER — Telehealth: Payer: Self-pay | Admitting: Emergency Medicine

## 2014-09-12 ENCOUNTER — Telehealth: Payer: Self-pay | Admitting: Family Medicine

## 2014-09-12 DIAGNOSIS — B354 Tinea corporis: Secondary | ICD-10-CM

## 2014-09-12 DIAGNOSIS — I10 Essential (primary) hypertension: Secondary | ICD-10-CM

## 2014-09-12 MED ORDER — FLUCONAZOLE 150 MG PO TABS: 300.0000 mg | ORAL_TABLET | ORAL | Status: DC

## 2014-09-12 MED ORDER — AMLODIPINE BESYLATE 10 MG PO TABS: 10.0000 mg | ORAL_TABLET | Freq: Every day | ORAL | Status: DC

## 2014-09-12 NOTE — Telephone Encounter (Signed)
Pt was told that they would send her prescription online to [facility] but they havent received it. Please follow up with pt.

## 2014-09-12 NOTE — Telephone Encounter (Signed)
Attempted to reach pt to inform medication transferred to [facility] but VM full to accept calls.

## 2014-09-23 ENCOUNTER — Ambulatory Visit: Payer: Self-pay | Attending: Family Medicine | Admitting: Family Medicine

## 2014-09-23 ENCOUNTER — Encounter: Payer: Self-pay | Admitting: Family Medicine

## 2014-09-23 VITALS — BP 119/82 | HR 76 | Temp 98.4°F | Resp 18 | Ht 63.0 in | Wt 223.0 lb

## 2014-09-23 DIAGNOSIS — Z299 Encounter for prophylactic measures, unspecified: Secondary | ICD-10-CM

## 2014-09-23 DIAGNOSIS — B354 Tinea corporis: Secondary | ICD-10-CM | POA: Insufficient documentation

## 2014-09-23 DIAGNOSIS — I10 Essential (primary) hypertension: Secondary | ICD-10-CM | POA: Insufficient documentation

## 2014-09-23 DIAGNOSIS — Z23 Encounter for immunization: Secondary | ICD-10-CM

## 2014-09-23 DIAGNOSIS — Z418 Encounter for other procedures for purposes other than remedying health state: Secondary | ICD-10-CM

## 2014-09-23 MED ORDER — CLOTRIMAZOLE 1 % EX CREA: 1.0000 "application " | TOPICAL_CREAM | Freq: Two times a day (BID) | CUTANEOUS | Status: DC

## 2014-09-23 MED ORDER — AMLODIPINE BESYLATE 10 MG PO TABS: 10.0000 mg | ORAL_TABLET | Freq: Every day | ORAL | Status: DC

## 2014-09-23 NOTE — Progress Notes (Signed)
   Subjective:    Patient ID: KALISE FICKETT, female    DOB: Feb 10, 1976, 38 y.o.   MRN: [id number] CC: HTN f/u  HPI 1. CHRONIC HYPERTENSION  Disease Monitoring  Blood pressure range: does not check   Chest pain: no   Dyspnea: no   Claudication: no   Medication compliance: yes  Medication Side Effects  Lightheadedness: no   Urinary frequency: no   Edema: yes, trace LE    Impotence: no   Preventitive Healthcare:  Exercise: yes, walks 30 minutes daily     2. Nipple rash: no improvement with oral diflucan. Chronic rash. Itching.   Soc hx: non smoker  Review of Systems As per HPI     Objective:   Physical Exam BP 119/82  Pulse 76  Temp(Src) 98.4 F (36.9 C) (Oral)  Resp 18  Ht 5\' 3"  (1.6 m)  Wt 223 lb (101.152 kg)  BMI 39.51 kg/m2  SpO2 99%  LMP 09/10/2014 General appearance: alert, cooperative and no distress Lungs: clear to auscultation bilaterally Heart: regular rate and rhythm, S1, S2 normal, no murmur, click, rub or gallop Extremities: edema trace  Skin: thickened scaly rash around nipples        Assessment & Plan:

## 2014-09-23 NOTE — Progress Notes (Signed)
F/U HTN  Stated been feeling good, medicine working

## 2014-09-23 NOTE — Patient Instructions (Signed)
Monica Brennan,  Thank you for coming back in to see me today Excellent BP! At goal of as close to 120/80 as possible. BP should always be < 140/90.  Continue amlodipine 10 mg daily. Continue to exercise. Continue low salt diet.   F/u in one year, sooner if needed.  Have a happy holiday season.   Dr. Adrian Blackwater

## 2014-09-23 NOTE — Assessment & Plan Note (Signed)
A: no change with oral diflucan.  P: D/c diflucan Topical azole

## 2014-09-23 NOTE — Assessment & Plan Note (Signed)
A: well controlled Meds: amlodipine 10 mg daily P: continue current medication regimen.

## 2014-10-06 ENCOUNTER — Encounter: Payer: Self-pay | Admitting: Family Medicine

## 2014-10-24 ENCOUNTER — Ambulatory Visit: Payer: Medicaid Other

## 2014-11-10 ENCOUNTER — Telehealth: Payer: Self-pay

## 2014-11-10 NOTE — Telephone Encounter (Signed)
Tried to call again concerning HTN

## 2014-11-10 NOTE — Telephone Encounter (Signed)
Called to discuss any further risk reduction counseling for blood pressure and BMI:41.9 through [facility] or lifestyle program. Will call again if patient does not return call.

## 2014-11-26 ENCOUNTER — Telehealth: Payer: Self-pay

## 2014-11-26 NOTE — Telephone Encounter (Signed)
Patient returned call. Patient stated that had received bills and wondered about them. When discussed with patient about weight loss and options with [facility] patient stated that the [facility] sounded like something she could do. We set appointment for Health Coaching on January 14th at 11 AM.

## 2014-12-18 ENCOUNTER — Ambulatory Visit: Payer: Self-pay

## 2014-12-18 DIAGNOSIS — Z789 Other specified health status: Secondary | ICD-10-CM

## 2014-12-18 NOTE — Progress Notes (Signed)
Patient returns today for Health Coaching regarding Hypertension, Activity and Nutrition for her Very Obese BMI.    HYPERTENSION: Per patient went to [facility] on January 10th. Per patient they gave Norvasc 10 mg every day for BP.Last BP at doctor's office was 118/82. Discussed hypertension: cause, effects, treatment, healthy weight, eating right and medication. Went over and gave handout concerning ten ways to decrease salt in your life.  NUTRITION:  Reviewed patient lab results, BP,and other numbers.Patient viewed BMI chart to see that she is considered very obese. Patient reviewed and did assessment in [facility] Explained how the [facility] book is suppose to be used..Discussed increasing fiber in diet, reading labels and measuring serving sizes. Patient went over 1,600 cal diet and we broke it down to the number of servings she could have. Patient received and reviewed the following handouts: [facility] Cookbook, 1600 calorie meal plan, and [facility] Program notebook. Gave patient a water bottle, snack container, magnets and measuring cup to measure serving sizes. I demonstrated the serving sizes with measuring cup.    ACTIVITY:  Discussed different type of activities and locations. Went over Exercise/Activity Go 4 Life booklet. Patient received a pedometer and demonstrated how to use.  MISCELLANEOUS: Patient had voiced that will be getting insurance through the [facility] in  A month or two.  PLAN: . Increase walking time. Review handouts and follow at home. Follow 1600 calorie meal plan.Patient will call if has any questions.Will call patient for Health Coaching follow up.

## 2014-12-18 NOTE — Patient Instructions (Signed)
Patient will follow 1600 calorie diet plan. Will review all handouts and exercise/activity book. Will increase exercise and use pedometer. Will measure portion sizes. Will call if has any questions. Will call patient in three weeks. Patient verbalized understanding.

## 2015-01-07 ENCOUNTER — Other Ambulatory Visit: Payer: Self-pay | Admitting: Family Medicine

## 2015-04-22 ENCOUNTER — Telehealth: Payer: Self-pay

## 2015-04-22 NOTE — Telephone Encounter (Signed)
Called to follow up on goals with [facility] and left message for patient to return call.

## 2015-07-07 ENCOUNTER — Telehealth: Payer: Self-pay

## 2015-07-07 NOTE — Telephone Encounter (Signed)
Called patient to follow up. Someone answered the phone with a crying baby in the background and hung up.

## 2015-08-06 ENCOUNTER — Telehealth: Payer: Self-pay

## 2015-08-06 NOTE — Telephone Encounter (Addendum)
This is the third time that have called patient for follow up Health Coaching and about HTN. Patient has not been back to [facility] since 09/23/14 or [facility] since 08/12/14. If do not hear back in few days will send certified letter to find out status of patient with [facility].

## 2015-08-11 NOTE — Progress Notes (Signed)
Certified letter sent in response to no return calls from patient.

## 2015-08-26 NOTE — Progress Notes (Signed)
Received reply back to certified letter. States problem is received medical bill and work. I attempted to contact today per work phone and was in class. Left message and will attempt to contact again concerning not been to [facility] and need mammogram.

## 2015-08-28 ENCOUNTER — Encounter: Payer: Self-pay | Admitting: Family Medicine

## 2015-08-28 ENCOUNTER — Ambulatory Visit: Payer: No Typology Code available for payment source | Attending: Family Medicine | Admitting: Family Medicine

## 2015-08-28 ENCOUNTER — Other Ambulatory Visit: Payer: Self-pay | Admitting: Family Medicine

## 2015-08-28 VITALS — BP 126/80 | HR 83 | Temp 99.0°F | Resp 16 | Ht 65.0 in | Wt 233.0 lb

## 2015-08-28 DIAGNOSIS — N951 Menopausal and female climacteric states: Secondary | ICD-10-CM | POA: Diagnosis present

## 2015-08-28 DIAGNOSIS — I10 Essential (primary) hypertension: Secondary | ICD-10-CM | POA: Diagnosis not present

## 2015-08-28 DIAGNOSIS — B354 Tinea corporis: Secondary | ICD-10-CM | POA: Diagnosis not present

## 2015-08-28 DIAGNOSIS — Z114 Encounter for screening for human immunodeficiency virus [HIV]: Secondary | ICD-10-CM | POA: Diagnosis not present

## 2015-08-28 DIAGNOSIS — Z0000 Encounter for general adult medical examination without abnormal findings: Secondary | ICD-10-CM | POA: Insufficient documentation

## 2015-08-28 LAB — CBC
HCT: 39.5 % (ref 36.0–46.0)
Hemoglobin: 13.7 g/dL (ref 12.0–15.0)
MCH: 28.4 pg (ref 26.0–34.0)
MCHC: 34.7 g/dL (ref 30.0–36.0)
MCV: 81.8 fL (ref 78.0–100.0)
MPV: 8.6 fL (ref 8.6–12.4)
Platelets: 366 10*3/uL (ref 150–400)
RBC: 4.83 MIL/uL (ref 3.87–5.11)
RDW: 15.5 % (ref 11.5–15.5)
WBC: 8.2 10*3/uL (ref 4.0–10.5)

## 2015-08-28 MED ORDER — CLOTRIMAZOLE 1 % EX CREA: 1.0000 "application " | TOPICAL_CREAM | Freq: Two times a day (BID) | CUTANEOUS | Status: DC

## 2015-08-28 MED ORDER — AMLODIPINE BESYLATE 10 MG PO TABS: 10.0000 mg | ORAL_TABLET | Freq: Every day | ORAL | Status: DC

## 2015-08-28 NOTE — Progress Notes (Signed)
F/U HTN  Medication refills

## 2015-08-28 NOTE — Patient Instructions (Addendum)
Ms. Montesano,  Thank you for coming in today.  Great job with your blood pressure and healthcare maintenance.  Yaret was seen today for hypertension.  Diagnoses and all orders for this visit:  Menopausal symptoms -     CBC -     FSH/LH  Screening for HIV (human immunodeficiency virus) -     HIV antibody (with reflex)  Essential hypertension -     amLODipine (NORVASC) 10 MG tablet; Take 1 tablet (10 mg total) by mouth daily.  Tinea corporis -     clotrimazole (LOTRIMIN) 1 % cream; Apply 1 application topically 2 (two) times daily. Apply to skin rash  Other orders -     Flu Vaccine QUAD 36+ mos IM   F/u in 4-6 weeks for pelvic exam to check for fibroids  Dr. Adrian Blackwater

## 2015-08-28 NOTE — Progress Notes (Signed)
   Subjective:  Patient ID: Monica Brennan, female    DOB: Mar 18, 1976  Age: 39 y.o. MRN: [id number]  CC: Hypertension  HPI Monica Brennan presents for   1. CHRONIC HYPERTENSION  Disease Monitoring  Chest pain: no   Dyspnea: no   Claudication: no   Medication compliance: yes  Medication Side Effects  Lightheadedness: yes   Urinary frequency: no   Edema: no   Impotence: no   2.  Menopausal symptoms: having heavy bleeding x one year with clots. Mom with hx of fibroids. Mom had hysterectomy in early 31s. Having hot flashes for 6 months.   Outpatient Prescriptions Prior to Visit  Medication Sig Dispense Refill  . amLODipine (NORVASC) 10 MG tablet Take 1 tablet (10 mg total) by mouth daily. 90 tablet 2  . clotrimazole (LOTRIMIN) 1 % cream Apply 1 application topically 2 (two) times daily. Apply to skin rash 30 g 2   No facility-administered medications prior to visit.    ROS Review of Systems  Constitutional: Negative for fever and chills.  Eyes: Negative for visual disturbance.  Respiratory: Negative for shortness of breath.   Cardiovascular: Negative for chest pain.  Gastrointestinal: Negative for abdominal pain and blood in stool.  Endocrine: Positive for heat intolerance.       Hot flashes   Genitourinary: Positive for menstrual problem.  Musculoskeletal: Negative for back pain and arthralgias.  Skin: Negative for rash.  Allergic/Immunologic: Negative for immunocompromised state.  Hematological: Negative for adenopathy. Does not bruise/bleed easily.  Psychiatric/Behavioral: Negative for suicidal ideas and dysphoric mood.  GAD-7: 2. 1-2,3. All others 3   Objective:  BP 126/80 mmHg  Pulse 83  Temp(Src) 99 F (37.2 C) (Oral)  Resp 16  Ht 5\' 5"  (1.651 m)  Wt 233 lb (105.688 kg)  BMI 38.77 kg/m2  SpO2 97%  LMP 08/18/2015  BP/Weight 08/28/2015 09/23/2014 12/11/5100  Systolic BP 585 277 824  Diastolic BP 80 82 235  Wt. (Lbs) 233 223 233  BMI 38.77 39.51  41.28   Physical Exam  Constitutional: She is oriented to person, place, and time. She appears well-developed and well-nourished. No distress.  Obese   HENT:  Head: Normocephalic and atraumatic.  Cardiovascular: Normal rate, regular rhythm, normal heart sounds and intact distal pulses.   Pulmonary/Chest: Effort normal and breath sounds normal.  Musculoskeletal: She exhibits no edema.  Neurological: She is alert and oriented to person, place, and time.  Skin: Skin is warm and dry. Rash (plaques around nipples ) noted.  Psychiatric: She has a normal mood and affect.     Assessment & Plan:   Problem List Items Addressed This Visit    Hypertension (Chronic)   Relevant Medications   amLODipine (NORVASC) 10 MG tablet   Menopausal symptoms - Primary   Relevant Orders   CBC   FSH/LH   Tinea corporis   Relevant Medications   clotrimazole (LOTRIMIN) 1 % cream    Other Visit Diagnoses    Screening for HIV (human immunodeficiency virus)        Relevant Orders    HIV antibody (with reflex)    Healthcare maintenance        Relevant Orders    Flu Vaccine QUAD 36+ mos IM (Completed)       No orders of the defined types were placed in this encounter.    Follow-up: No Follow-up on file.   Boykin Nearing MD

## 2015-08-29 LAB — FSH/LH
FSH: 6.1 m[IU]/mL
LH: 4.1 m[IU]/mL

## 2015-08-29 LAB — HIV ANTIBODY (ROUTINE TESTING W REFLEX): HIV 1&2 Ab, 4th Generation: NONREACTIVE

## 2015-09-29 ENCOUNTER — Telehealth: Payer: Self-pay | Admitting: *Deleted

## 2015-09-29 ENCOUNTER — Other Ambulatory Visit: Payer: Medicaid Other | Admitting: Family Medicine

## 2015-09-29 NOTE — Telephone Encounter (Signed)
LVM to return call.

## 2015-09-29 NOTE — Telephone Encounter (Signed)
-----   Message from Boykin Nearing, MD sent at 08/31/2015  8:19 AM EDT ----- Screening HIV negative CBC normal Hormone levels normal

## 2015-09-30 NOTE — Telephone Encounter (Signed)
Pt. Returned call. Please f/u with pt. °

## 2015-09-30 NOTE — Telephone Encounter (Signed)
Date of birth verified by pt Normal lab results-CBC and hormon levels  Negative HIV Pt verbalize understanding

## 2016-01-05 MED FILL — ?AMLODIPINE BESYLATE 10 MG: 10 | 30 days supply | Qty: 30 | Fill #2

## 2016-01-05 MED FILL — CLOTRIMAZOLE 1% CREAM: 1 | 30 days supply | Qty: 28 | Fill #1

## 2016-02-12 MED FILL — AMLODIPINE BESYLATE 10 MG T: 10 | 30 days supply | Qty: 30 | Fill #3

## 2016-03-27 ENCOUNTER — Encounter (HOSPITAL_COMMUNITY): Payer: Self-pay | Admitting: Family Medicine

## 2016-03-27 ENCOUNTER — Emergency Department (HOSPITAL_COMMUNITY): Payer: BLUE CROSS/BLUE SHIELD

## 2016-03-27 ENCOUNTER — Emergency Department (HOSPITAL_COMMUNITY)
Admission: EM | Admit: 2016-03-27 | Discharge: 2016-03-27 | Disposition: A | Discharge status: Home / Self Care | Payer: BLUE CROSS/BLUE SHIELD | Attending: Emergency Medicine | Admitting: Emergency Medicine

## 2016-03-27 DIAGNOSIS — Z79899 Other long term (current) drug therapy: Secondary | ICD-10-CM | POA: Insufficient documentation

## 2016-03-27 DIAGNOSIS — R5381 Other malaise: Secondary | ICD-10-CM | POA: Insufficient documentation

## 2016-03-27 DIAGNOSIS — I10 Essential (primary) hypertension: Secondary | ICD-10-CM | POA: Diagnosis not present

## 2016-03-27 DIAGNOSIS — R059 Cough, unspecified: Secondary | ICD-10-CM

## 2016-03-27 DIAGNOSIS — J069 Acute upper respiratory infection, unspecified: Secondary | ICD-10-CM

## 2016-03-27 DIAGNOSIS — M791 Myalgia: Secondary | ICD-10-CM | POA: Insufficient documentation

## 2016-03-27 DIAGNOSIS — R05 Cough: Secondary | ICD-10-CM | POA: Diagnosis present

## 2016-03-27 NOTE — Discharge Instructions (Signed)
Continue symptomatic treatment with your OTC medications. Return to ED with new, worsening or concerning symptoms.    Upper Respiratory Infection, Adult Most upper respiratory infections (URIs) are a viral infection of the air passages leading to the lungs. A URI affects the nose, throat, and upper air passages. The most common type of URI is nasopharyngitis and is typically referred to as "the common cold." URIs run their course and usually go away on their own. Most of the time, a URI does not require medical attention, but sometimes a bacterial infection in the upper airways can follow a viral infection. This is called a secondary infection. Sinus and middle ear infections are common types of secondary upper respiratory infections. Bacterial pneumonia can also complicate a URI. A URI can worsen asthma and chronic obstructive pulmonary disease (COPD). Sometimes, these complications can require emergency medical care and may be life threatening.  CAUSES Almost all URIs are caused by viruses. A virus is a type of germ and can spread from one person to another.  RISKS FACTORS You may be at risk for a URI if:   You smoke.   You have chronic heart or lung disease.  You have a weakened defense (immune) system.   You are very young or very old.   You have nasal allergies or asthma.  You work in crowded or poorly ventilated areas.  You work in health care facilities or schools. SIGNS AND SYMPTOMS  Symptoms typically develop 2-3 days after you come in contact with a cold virus. Most viral URIs last 7-10 days. However, viral URIs from the influenza virus (flu virus) can last 14-18 days and are typically more severe. Symptoms may include:   Runny or stuffy (congested) nose.   Sneezing.   Cough.   Sore throat.   Headache.   Fatigue.   Fever.   Loss of appetite.   Pain in your forehead, behind your eyes, and over your cheekbones (sinus pain).  Muscle aches.  DIAGNOSIS    Your health care provider may diagnose a URI by:  Physical exam.  Tests to check that your symptoms are not due to another condition such as:  Strep throat.  Sinusitis.  Pneumonia.  Asthma. TREATMENT  A URI goes away on its own with time. It cannot be cured with medicines, but medicines may be prescribed or recommended to relieve symptoms. Medicines may help:  Reduce your fever.  Reduce your cough.  Relieve nasal congestion. HOME CARE INSTRUCTIONS   Take medicines only as directed by your health care provider.   Gargle warm saltwater or take cough drops to comfort your throat as directed by your health care provider.  Use a warm mist humidifier or inhale steam from a shower to increase air moisture. This may make it easier to breathe.  Drink enough fluid to keep your urine clear or pale yellow.   Eat soups and other clear broths and maintain good nutrition.   Rest as needed.   Return to work when your temperature has returned to normal or as your health care provider advises. You may need to stay home longer to avoid infecting others. You can also use a face mask and careful hand washing to prevent spread of the virus.  Increase the usage of your inhaler if you have asthma.   Do not use any tobacco products, including cigarettes, chewing tobacco, or electronic cigarettes. If you need help quitting, ask your health care provider. PREVENTION  The best way to protect yourself  from getting a cold is to practice good hygiene.   Avoid oral or hand contact with people with cold symptoms.   Wash your hands often if contact occurs.  There is no clear evidence that vitamin C, vitamin E, echinacea, or exercise reduces the chance of developing a cold. However, it is always recommended to get plenty of rest, exercise, and practice good nutrition.  SEEK MEDICAL CARE IF:   You are getting worse rather than better.   Your symptoms are not controlled by medicine.   You  have chills.  You have worsening shortness of breath.  You have brown or red mucus.  You have yellow or brown nasal discharge.  You have pain in your face, especially when you bend forward.  You have a fever.  You have swollen neck glands.  You have pain while swallowing.  You have white areas in the back of your throat. SEEK IMMEDIATE MEDICAL CARE IF:   You have severe or persistent:  Headache.  Ear pain.  Sinus pain.  Chest pain.  You have chronic lung disease and any of the following:  Wheezing.  Prolonged cough.  Coughing up blood.  A change in your usual mucus.  You have a stiff neck.  You have changes in your:  Vision.  Hearing.  Thinking.  Mood. MAKE SURE YOU:   Understand these instructions.  Will watch your condition.  Will get help right away if you are not doing well or get worse.   This information is not intended to replace advice given to you by your health care provider. Make sure you discuss any questions you have with your health care provider.   Document Released: 05/17/2001 Document Revised: 04/07/2015 Document Reviewed: 02/26/2014 Elsevier Interactive Patient Education [employer].

## 2016-03-27 NOTE — ED Provider Notes (Signed)
CSN: [id number]     Arrival date & time 03/27/16  1200 History  By signing my name below, I, Soijett Blue, attest that this documentation has been prepared under the direction and in the presence of Josephina Gip, PA-C Electronically Signed: Soijett Blue, ED Scribe. 03/27/2016. 12:59 PM.   Chief Complaint  Patient presents with  . Cough   The history is provided by the patient. No language interpreter was used.   Monica Brennan is a 40 y.o. female who presents to the Emergency Department complaining of URI symptoms. Pt reports that her symptoms initially began with generalized myalgias and malaise approximately 6 days ago. She then developed a cough, nasal congestion, rhinorrhea and hoarse voice approximately 4 days ago. She states that her cough is productive of thin, green sputum. She denies purulent nasal discharge or sinus pressure. She states that her throat hurts when she coughs but at no other times.  She states that she has tried aleve, tylenol, dayquil, nyquil, OTC lozenges, theraflu, alka-seltzer with good relief for her symptoms. 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 She denies fevers, chills, headache, ear pain, eye discharge, neck pain, abdominal pain, n/v, constipation, diarrhea, and any other symptoms.  Past Medical History  Diagnosis Date  . Hypertension Dx 2006    previously, on lisinopril. stopped lisinopril when medicaid ran out in 2007.    Past Surgical History  Procedure Laterality Date  . Cesarean section  2006  . Tubal ligation  2006   Family History  Problem Relation Age of Onset  . Diabetes Brother   . Diabetes Maternal Aunt   . Cancer Maternal Aunt     lung   . Diabetes Maternal Grandmother   . Diabetes Son   . Hypertension Mother   . Heart disease Mother    Social History  Substance Use Topics  . Smoking status: Never Smoker   . Smokeless tobacco: Never Used  . Alcohol Use: No   OB History    Gravida  Para Term Preterm AB TAB SAB Ectopic Multiple Living   3 2 2  1  1   2      Review of Systems  HENT: Positive for congestion, rhinorrhea and voice change. Negative for trouble swallowing.   Respiratory: Positive for cough.   Gastrointestinal: Negative for nausea, vomiting, diarrhea and constipation.  Musculoskeletal: Negative for myalgias.  Skin: Negative for color change, rash and wound.  Neurological: Negative for headaches.  All other systems reviewed and are negative.  Allergies  Review of patient's allergies indicates no known allergies.  Home Medications   Prior to Admission medications   Medication Sig Start Date End Date Taking? Authorizing Provider  amLODipine (NORVASC) 10 MG tablet Take 1 tablet (10 mg total) by mouth daily. 08/28/15   Josalyn Funches, MD  clotrimazole (LOTRIMIN) 1 % cream Apply 1 application topically 2 (two) times daily. Apply to skin rash 08/28/15   Boykin Nearing, MD  CLOTRIMAZOLE ANTI-FUNGAL 1 % cream APPLY 1 APPLICATION TOPICALLY 2 TIMES DAILY. APPLY TO SKIN RASH 11/10/15   Josalyn Funches, MD   BP 131/93 mmHg  Pulse 72  Temp(Src) 98.4 F (36.9 C)  Resp 18  SpO2 98%  LMP 03/20/2016 Physical Exam  Constitutional: She appears well-developed and well-nourished. No distress.  HENT:  Head: Normocephalic and atraumatic.  Right Ear: Tympanic membrane, external ear and ear canal normal.  Left Ear: Tympanic membrane, external ear and ear canal normal.  Nose: Nose normal. Right sinus exhibits no maxillary sinus tenderness and no frontal sinus tenderness. Left sinus exhibits no maxillary sinus tenderness and no frontal sinus tenderness.  Mouth/Throat: Uvula is midline, oropharynx is clear and moist and mucous membranes are normal. No oropharyngeal exudate, posterior oropharyngeal edema or posterior oropharyngeal erythema.  Hoarse sounding voice  Eyes: Conjunctivae are normal. Right eye exhibits no discharge. Left eye exhibits no discharge. No scleral  icterus.  Neck: Normal range of motion.  Cardiovascular: Normal rate, regular rhythm and normal heart sounds.  Exam reveals no gallop and no friction rub.   No murmur heard. Pulmonary/Chest: Effort normal and breath sounds normal. No respiratory distress. She has no wheezes. She has no rales.  Lungs clear to ausculation bilaterally.  Musculoskeletal: Normal range of motion.  Moves all extremities spontaneously  Neurological: She is alert. Coordination normal.  Skin: Skin is warm and dry.  Psychiatric: She has a normal mood and affect. Her behavior is normal.  Nursing note and vitals reviewed.   ED Course  Procedures (including critical care time) DIAGNOSTIC STUDIES: Oxygen Saturation is 98% on RA, nl by my interpretation.    COORDINATION OF CARE: 12:59 PM Discussed treatment plan with pt at bedside which includes CXR and continue symptomatic treatment and pt agreed to plan.    Labs Review Labs Reviewed - No data to display  Imaging Review Dg Chest 2 View  03/27/2016  CLINICAL DATA:  Productive cough, short of breath EXAM: CHEST  2 VIEW COMPARISON:  None. FINDINGS: Normal mediastinum and cardiac silhouette. Normal pulmonary vasculature. No evidence of effusion, infiltrate, or pneumothorax. No acute bony abnormality. IMPRESSION: Normal chest radiograph Electronically Signed   By: Suzy Bouchard M.D.   On: 03/27/2016 12:44   I have personally reviewed and evaluated these images as part of my medical decision-making.   EKG Interpretation None      MDM   Final diagnoses:  URI (upper respiratory infection)   Patient presenting with myalgias, malaise, cough, congestion x 6 days. VSS. Pt is nontoxic appearing. No nasal musosal edema noted and no sinus TTP. TMs pearly gray without erythema or effusion. Oropharynx without erythema, edema or exudate. Lungs CTAB. CXR negative for acute infiltrate. Patients symptoms are consistent with URI, likely viral etiology. Discussed that  antibiotics are not indicated for viral infections. Pt will be discharged with symptomatic treatment. Verbalizes understanding and is agreeable with plan. Pt is hemodynamically stable & in NAD prior to dc.  I personally performed the services described in this documentation, which was scribed in my presence. The recorded information has been reviewed and is accurate.   Lahoma Crocker Derian Pfost, PA-C 03/27/16 1323  Sherwood Gambler, MD 03/29/16 [phone]

## 2016-03-27 NOTE — ED Notes (Signed)
Declined W/C at D/C and was escorted to lobby by RN. 

## 2016-03-27 NOTE — ED Notes (Signed)
Pt here for cough, and loss of voice. sts green sputum

## 2016-03-29 MED FILL — AMLODIPINE BESYLATE 10 MG T: 10 | 30 days supply | Qty: 30 | Fill #4

## 2016-05-05 MED FILL — ?AMLODIPINE BESYLATE 10 MG: 10 | 30 days supply | Qty: 30 | Fill #5

## 2016-06-06 ENCOUNTER — Emergency Department (HOSPITAL_COMMUNITY)
Admission: EM | Admit: 2016-06-06 | Discharge: 2016-06-06 | Disposition: A | Discharge status: Home / Self Care | Payer: BLUE CROSS/BLUE SHIELD | Attending: Emergency Medicine | Admitting: Emergency Medicine

## 2016-06-06 ENCOUNTER — Encounter (HOSPITAL_COMMUNITY): Payer: Self-pay

## 2016-06-06 DIAGNOSIS — R21 Rash and other nonspecific skin eruption: Secondary | ICD-10-CM | POA: Diagnosis not present

## 2016-06-06 DIAGNOSIS — Z79899 Other long term (current) drug therapy: Secondary | ICD-10-CM | POA: Diagnosis not present

## 2016-06-06 DIAGNOSIS — I10 Essential (primary) hypertension: Secondary | ICD-10-CM | POA: Insufficient documentation

## 2016-06-06 NOTE — ED Provider Notes (Signed)
CSN: [id number]     Arrival date & time 06/06/16  1043 History  By signing my name below, I, Monica Brennan, attest that this documentation has been prepared under the direction and in the presence of Gay Filler, PA-C. Electronically Signed: Judithann Sauger, ED Scribe. 06/06/2016. 11:12 PM.    No chief complaint on file.  The history is provided by the patient. No language interpreter was used.   HPI Comments: Monica Brennan is a 40 y.o. female with a hx of hypertension who presents to the Emergency Department complaining of a gradually worsening moderately itchy, and mildly painful rash to fingers and between fingers on bilateral hands onset 2 days ago. She works at a daycare, predominantly with infants and toddlers. She wears gloves constantly throughout the day and denies allergy to latex. She recently picked up toys that had been exposed to wet mulch and worms. She denies any known sick contact with hand, foot, mouth disease at her daycare. No one in her house has a similar rash.  No alleviating factors noted. She states that she has tried OTC cream used to treat eczema with no relief. She denies any new lotions, soaps, or detergent. No tick exposure noted. She denies any fever, chills, night sweats, trouble swallowing, SOB, or any lip/tongue swelling.    Past Medical History  Diagnosis Date  . Hypertension Dx 2006    previously, on lisinopril. stopped lisinopril when medicaid ran out in 2007.    Past Surgical History  Procedure Laterality Date  . Cesarean section  2006  . Tubal ligation  2006   Family History  Problem Relation Age of Onset  . Diabetes Brother   . Diabetes Maternal Aunt   . Cancer Maternal Aunt     lung   . Diabetes Maternal Grandmother   . Diabetes Son   . Hypertension Mother   . Heart disease Mother    Social History  Substance Use Topics  . Smoking status: Never Smoker   . Smokeless tobacco: Never Used  . Alcohol Use: No   OB History    Gravida  Para Term Preterm AB TAB SAB Ectopic Multiple Living   3 2 2  1  1   2      Review of Systems  Constitutional: Negative for fever and chills.  HENT: Negative for facial swelling and trouble swallowing.   Respiratory: Negative for shortness of breath.   Skin: Positive for rash.     Allergies  Review of patient's allergies indicates no known allergies.  Home Medications   Prior to Admission medications   Medication Sig Start Date End Date Taking? Authorizing Provider  amLODipine (NORVASC) 10 MG tablet Take 1 tablet (10 mg total) by mouth daily. 08/28/15   Josalyn Funches, MD  clotrimazole (LOTRIMIN) 1 % cream Apply 1 application topically 2 (two) times daily. Apply to skin rash 08/28/15   Boykin Nearing, MD  CLOTRIMAZOLE ANTI-FUNGAL 1 % cream APPLY 1 APPLICATION TOPICALLY 2 TIMES DAILY. APPLY TO SKIN RASH 11/10/15   Josalyn Funches, MD   BP 148/98 mmHg  Pulse 70  Temp(Src) 98 F (36.7 C) (Oral)  Resp 16  SpO2 99% Physical Exam  Constitutional: She appears well-developed and well-nourished. No distress.  HENT:  Head: Normocephalic and atraumatic.  Mouth/Throat: Uvula is midline, oropharynx is clear and moist and mucous membranes are normal. No trismus in the jaw. No uvula swelling. No oropharyngeal exudate, posterior oropharyngeal edema or posterior oropharyngeal erythema.  Eyes: Conjunctivae are normal. No scleral  icterus.  Neck: Normal range of motion.  Pulmonary/Chest: Effort normal. No stridor. No respiratory distress.  Neurological: She is alert.  Skin: Skin is warm and dry. She is not diaphoretic. No erythema.  Diffuse tiny vesicles and flesh colored papules noted on interdigital area of fingers. No other location of rash. No warmth or erythema. No purulent discharge.    Psychiatric: She has a normal mood and affect. Her behavior is normal.  Nursing note and vitals reviewed.   ED Course  Procedures (including critical care time) DIAGNOSTIC STUDIES: Oxygen Saturation is  99% on RA, normal by my interpretation.    COORDINATION OF CARE: 1:37 PM- Pt advised of plan for treatment and pt agrees.   MDM   Final diagnoses:  Rash   Pt is afebrile and non-toxic appearing in NAD. BP is elevated; vital signs otherwise stable. Pt h/o HTN on medication. Physical exam remarkable for diffuse vesicular and papular rash isolated to hands. Low suspicion for herpetic whitlow - rash is not erythematous. ?viral exanthem e.g hand foot mouth disease vs. Dermatitis vs. Non-specific eruption. No signs of infection. Symptomatic management to include oral or topical benadryl. Keep skin clean and dry. Avoid scented lotions and soaps. Follow up with PCP for re-evaluation. Return precautions discussed. Pt voiced understanding and is agreeable.   I personally performed the services described in this documentation, which was scribed in my presence. The recorded information has been reviewed and is accurate.   Roxanna Mew, [place] 06/06/16 [place], MD 06/09/16 1226

## 2016-06-06 NOTE — ED Notes (Signed)
Declined W/C at D/C and was escorted to lobby by RN. 

## 2016-06-06 NOTE — ED Notes (Signed)
Patient here with rash to bilateral hands x 2 days, itching to same. Works in [employer] and here to be evaluated for fifths disease

## 2016-06-06 NOTE — Discharge Instructions (Signed)
Read the information below.   You can take Benadryl for itch relief. You can also try topical benadryl for relief. You should start to feel better in the next 3-5 days. Lesions usually removal resolve in approximately 7-10 days. Use the prescribed medication as directed.  Please discuss all new medications with your pharmacist.   Encouraged to follow up with your primary care provider if symptoms do not improve.  You may return to the Emergency Department at any time for worsening condition or any new symptoms that concern you. Return to emergency department if you develops fever, difficulty breathing, or difficulty swallowing.

## 2016-06-16 ENCOUNTER — Encounter: Payer: Self-pay | Admitting: Family Medicine

## 2016-06-16 ENCOUNTER — Telehealth: Payer: Self-pay | Admitting: Family Medicine

## 2016-06-16 ENCOUNTER — Other Ambulatory Visit: Payer: Self-pay | Admitting: *Deleted

## 2016-06-16 ENCOUNTER — Ambulatory Visit: Payer: BLUE CROSS/BLUE SHIELD | Attending: Family Medicine | Admitting: Family Medicine

## 2016-06-16 VITALS — BP 115/81 | HR 74 | Temp 98.6°F | Resp 18 | Ht 66.0 in | Wt 229.0 lb

## 2016-06-16 DIAGNOSIS — D1779 Benign lipomatous neoplasm of other sites: Secondary | ICD-10-CM | POA: Diagnosis present

## 2016-06-16 DIAGNOSIS — Z79899 Other long term (current) drug therapy: Secondary | ICD-10-CM | POA: Insufficient documentation

## 2016-06-16 DIAGNOSIS — M545 Low back pain, unspecified: Secondary | ICD-10-CM

## 2016-06-16 DIAGNOSIS — Z0189 Encounter for other specified special examinations: Secondary | ICD-10-CM | POA: Diagnosis not present

## 2016-06-16 DIAGNOSIS — B9689 Other specified bacterial agents as the cause of diseases classified elsewhere: Secondary | ICD-10-CM

## 2016-06-16 DIAGNOSIS — N760 Acute vaginitis: Secondary | ICD-10-CM

## 2016-06-16 DIAGNOSIS — N898 Other specified noninflammatory disorders of vagina: Secondary | ICD-10-CM | POA: Insufficient documentation

## 2016-06-16 DIAGNOSIS — Z0000 Encounter for general adult medical examination without abnormal findings: Secondary | ICD-10-CM

## 2016-06-16 DIAGNOSIS — I10 Essential (primary) hypertension: Secondary | ICD-10-CM

## 2016-06-16 DIAGNOSIS — R102 Pelvic and perineal pain: Secondary | ICD-10-CM | POA: Diagnosis present

## 2016-06-16 DIAGNOSIS — D171 Benign lipomatous neoplasm of skin and subcutaneous tissue of trunk: Secondary | ICD-10-CM | POA: Insufficient documentation

## 2016-06-16 DIAGNOSIS — A499 Bacterial infection, unspecified: Secondary | ICD-10-CM

## 2016-06-16 LAB — POCT GLYCOSYLATED HEMOGLOBIN (HGB A1C): HEMOGLOBIN A1C: 5.4

## 2016-06-16 MED ORDER — AMLODIPINE BESYLATE 10 MG PO TABS: 10.0000 mg | ORAL_TABLET | Freq: Every day | ORAL | Status: DC

## 2016-06-16 NOTE — Assessment & Plan Note (Addendum)
Intermittent RL  pelvic pain Normal exam fam hx of fibroids  Pelvic and transvaginal ultrasound

## 2016-06-16 NOTE — Telephone Encounter (Signed)
-----   Message from Boykin Nearing, MD sent at 06/16/2016 11:40 PM EDT ----- Regarding: POCT A1c [place],   Please result the A1c from her OV, so I can close the encounter.  Dr. Adrian Blackwater

## 2016-06-16 NOTE — Assessment & Plan Note (Signed)
LU back lipoma Reassurance offered

## 2016-06-16 NOTE — Progress Notes (Signed)
SUBJECTIVE:  40 y.o. female for annual routine physical.   Social History  Substance Use Topics  . Smoking status: Never Smoker   . Smokeless tobacco: Never Used  . Alcohol Use: No    Current Outpatient Prescriptions  Medication Sig Dispense Refill  . amLODipine (NORVASC) 10 MG tablet Take 1 tablet (10 mg total) by mouth daily. 90 tablet 2  . clotrimazole (LOTRIMIN) 1 % cream Apply 1 application topically 2 (two) times daily. Apply to skin rash 30 g 2  . CLOTRIMAZOLE ANTI-FUNGAL 1 % cream APPLY 1 APPLICATION TOPICALLY 2 TIMES DAILY. APPLY TO SKIN RASH 28.35 g 2   No current facility-administered medications for this visit.   Allergies: Review of patient's allergies indicates no known allergies.  Patient's last menstrual period was 06/15/2016.  ROS:  Feeling well. No dyspnea or chest pain on exertion.  No abdominal pain, change in bowel habits, black or bloody stools.  No urinary tract symptoms. GYN ROS: she complains of vaginal dryness. Intermittent RL pelvic pain. No vaginal discharge. No abnormal vaginal bleeding. No neurological complaints. Low back pain. L upper back mass.   OBJECTIVE:  The patient appears well, alert, oriented x 3, in no distress. BP 115/81 mmHg  Pulse 74  Temp(Src) 98.6 F (37 C) (Oral)  Resp 18  Ht 5\' 6"  (1.676 m)  Wt 229 lb (103.874 kg)  BMI 36.98 kg/m2  SpO2 98%  LMP 06/15/2016 ENT normal.  Neck supple. No adenopathy or thyromegaly. PERLA. Lungs are clear, good air entry, no wheezes, rhonchi or rales. S1 and S2 normal, no murmurs, regular rate and rhythm.  Back non tender mass in L upper back under scapular spine. Abdomen soft without tenderness, guarding, mass or organomegaly. Extremities show no edema, normal peripheral pulses. Neurological is normal, no focal findings.  BREAST EXAM: breasts appear normal, no suspicious masses, no skin or nipple changes or axillary nodes  PELVIC EXAM: normal external genitalia, vulva, vagina, cervix, uterus and  adnexa normal but exam difficult to due habitus   ASSESSMENT:  well woman  PLAN:  mammogram pap smear return annually or prn

## 2016-06-16 NOTE — Progress Notes (Signed)
Patient is here for Physical  Patient denies pain at this time  Patient has taken medication today and patient has eaten.

## 2016-06-16 NOTE — Patient Instructions (Addendum)
Monica Brennan was seen today for annual exam.  Diagnoses and all orders for this visit:  Vaginal discharge -     Cervicovaginal ancillary only -     Wet prep, genital  Lipoma of back  Right-sided low back pain without sciatica  Pelvic pain in female -     US Pelvis Complete; Future -     US Transvaginal Non-OB; Future  Wellness examination -     HgB A1c    I do not recommend douche  You will be called with wet prep results and ultrasound results  Continue to work on weight loss with regular exercise and low carb diet   F/u in 6 months for HTN, sooner if needed   Dr. Adrian Blackwater    Lipoma A lipoma is a noncancerous (benign) tumor that is made up of fat cells. This is a very common type of soft-tissue growth. Lipomas are usually found under the skin (subcutaneous). They may occur in any tissue of the body that contains fat. Common areas for lipomas to appear include the back, shoulders, buttocks, and thighs. Lipomas grow slowly, and they are usually painless. Most lipomas do not cause problems and do not require treatment. CAUSES The cause of this condition is not known. RISK FACTORS This condition is more likely to develop in:  People who are 73-27 years old.  People who have a family history of lipomas. SYMPTOMS A lipoma usually appears as a small, round bump under the skin. It may feel soft or rubbery, but the firmness can vary. Most lipomas are not painful. However, a lipoma may become painful if it is located in an area where it pushes on nerves. DIAGNOSIS A lipoma can usually be diagnosed with a physical exam. You may also have tests to confirm the diagnosis and to rule out other conditions. Tests may include:  Imaging tests, such as a CT scan or MRI.  Removal of a tissue sample to be looked at under a microscope (biopsy). TREATMENT Treatment is not needed for small lipomas that are not causing problems. If a lipoma continues to get bigger or it causes problems, removal is  often the best option. Lipomas can also be removed to improve appearance. Removal of a lipoma is usually done with a surgery in which the fatty cells and the surrounding capsule are removed. Most often, a medicine that numbs the area (local anesthetic) is used for this procedure. HOME CARE INSTRUCTIONS  Keep all follow-up visits as directed by your health care provider. This is important. SEEK MEDICAL CARE IF:  Your lipoma becomes larger or hard.  Your lipoma becomes painful, red, or increasingly swollen. These could be signs of infection or a more serious condition.   This information is not intended to replace advice given to you by your health care provider. Make sure you discuss any questions you have with your health care provider.   Document Released: 11/11/2002 Document Revised: 04/07/2015 Document Reviewed: 11/17/2014 Elsevier Interactive Patient Education [employer].

## 2016-06-16 NOTE — Assessment & Plan Note (Signed)
Scant vaginal discharge suspect BV Wet prep done

## 2016-06-19 NOTE — Addendum Note (Signed)
Addended by: Kristen Cardinal on: 06/19/2016 06:35 PM   Modules accepted: Miquel Dunn

## 2016-06-23 ENCOUNTER — Ambulatory Visit (HOSPITAL_COMMUNITY)
Admission: RE | Admit: 2016-06-23 | Discharge: 2016-06-23 | Disposition: A | Discharge status: Home / Self Care | Payer: BLUE CROSS/BLUE SHIELD | Source: Ambulatory Visit | Attending: Family Medicine | Admitting: Family Medicine

## 2016-06-23 DIAGNOSIS — R188 Other ascites: Secondary | ICD-10-CM | POA: Insufficient documentation

## 2016-06-23 DIAGNOSIS — R102 Pelvic and perineal pain: Secondary | ICD-10-CM | POA: Insufficient documentation

## 2016-06-23 DIAGNOSIS — D259 Leiomyoma of uterus, unspecified: Secondary | ICD-10-CM | POA: Insufficient documentation

## 2016-06-24 ENCOUNTER — Encounter: Payer: Self-pay | Admitting: Family Medicine

## 2016-06-24 ENCOUNTER — Telehealth: Payer: Self-pay

## 2016-06-24 DIAGNOSIS — D259 Leiomyoma of uterus, unspecified: Secondary | ICD-10-CM | POA: Insufficient documentation

## 2016-06-24 NOTE — Telephone Encounter (Signed)
S/w Tiffany at [facility] advised yes ordering provider was requesting wet prep testing (also verified in chart for [facility] 06/16/16)

## 2016-06-25 LAB — WET PREP BY MOLECULAR PROBE
CANDIDA SPECIES: NEGATIVE
Gardnerella vaginalis: POSITIVE — AB
TRICHOMONAS VAG: NEGATIVE

## 2016-06-27 MED ORDER — METRONIDAZOLE 500 MG PO TABS: 500.0000 mg | ORAL_TABLET | Freq: Two times a day (BID) | ORAL | 0 refills | Status: DC

## 2016-06-27 MED ORDER — FLUCONAZOLE 150 MG PO TABS: 150.0000 mg | ORAL_TABLET | Freq: Once | ORAL | 0 refills | Status: AC

## 2016-06-27 MED FILL — FLUCONAZOLE 150 MG TABLET: 150 | 1 days supply | Qty: 1 | Fill #0

## 2016-06-27 MED FILL — metroNIDAZOLE 500 MG TABS: 500 | 7 days supply | Qty: 14 | Fill #0

## 2016-06-27 NOTE — Addendum Note (Signed)
Addended by: Boykin Nearing on: 06/27/2016 08:27 AM   Modules accepted: Orders, SmartSet

## 2016-06-28 ENCOUNTER — Telehealth: Payer: Self-pay | Admitting: Family Medicine

## 2016-06-28 ENCOUNTER — Telehealth: Payer: Self-pay

## 2016-06-28 NOTE — Telephone Encounter (Signed)
Pt called the office asking to speak to nurse regarding her lab results. Please follow up.

## 2016-06-28 NOTE — Telephone Encounter (Signed)
Returned pt call and went over lab results. Pt is aware of results. Pt also would like to know her results for her ultrasound.

## 2016-06-29 ENCOUNTER — Telehealth: Payer: Self-pay

## 2016-06-29 MED FILL — AMLODIPINE BESYLATE 10 MG T: 10 | 10 days supply | Qty: 30 | Fill #0

## 2016-06-29 MED FILL — CLOTRIMAZOLE 1% CREAM: 1 | 30 days supply | Qty: 28 | Fill #2

## 2016-06-29 NOTE — Telephone Encounter (Signed)
Contacted pt to go over ultrasound results. Pt is aware of results and would like a copy of it sent to her in the mail

## 2016-06-30 NOTE — Telephone Encounter (Signed)
Pt is aware of ultrasound results

## 2016-06-30 NOTE — Telephone Encounter (Signed)
Ultrasound results were already in. Please call back to patient and give results

## 2016-08-22 MED FILL — AMLODIPINE BESYLATE 10 MG T: 10 | 10 days supply | Qty: 30 | Fill #1

## 2016-11-01 MED FILL — AMLODIPINE BESYLATE 10 MG T: 10 | 10 days supply | Qty: 30 | Fill #2

## 2017-01-06 MED FILL — AMLODIPINE BESYLATE 10 MG T: 10 | 10 days supply | Qty: 30 | Fill #3

## 2017-01-19 ENCOUNTER — Ambulatory Visit: Payer: BLUE CROSS/BLUE SHIELD | Admitting: Family Medicine

## 2017-01-20 ENCOUNTER — Ambulatory Visit: Payer: BLUE CROSS/BLUE SHIELD | Attending: Family Medicine | Admitting: Family Medicine

## 2017-01-20 ENCOUNTER — Encounter: Payer: Self-pay | Admitting: Family Medicine

## 2017-01-20 VITALS — BP 121/81 | HR 87 | Temp 99.3°F | Ht 66.0 in | Wt 233.2 lb

## 2017-01-20 DIAGNOSIS — R42 Dizziness and giddiness: Secondary | ICD-10-CM | POA: Insufficient documentation

## 2017-01-20 DIAGNOSIS — I10 Essential (primary) hypertension: Secondary | ICD-10-CM | POA: Diagnosis not present

## 2017-01-20 DIAGNOSIS — L292 Pruritus vulvae: Secondary | ICD-10-CM | POA: Diagnosis not present

## 2017-01-20 DIAGNOSIS — L298 Other pruritus: Secondary | ICD-10-CM | POA: Diagnosis not present

## 2017-01-20 DIAGNOSIS — N898 Other specified noninflammatory disorders of vagina: Secondary | ICD-10-CM

## 2017-01-20 DIAGNOSIS — Z79899 Other long term (current) drug therapy: Secondary | ICD-10-CM | POA: Diagnosis not present

## 2017-01-20 DIAGNOSIS — B356 Tinea cruris: Secondary | ICD-10-CM | POA: Insufficient documentation

## 2017-01-20 DIAGNOSIS — Z1231 Encounter for screening mammogram for malignant neoplasm of breast: Secondary | ICD-10-CM | POA: Diagnosis not present

## 2017-01-20 MED ORDER — NYSTATIN 100000 UNIT/GM EX POWD: Freq: Two times a day (BID) | CUTANEOUS | 2 refills | Status: DC

## 2017-01-20 MED ORDER — AMLODIPINE BESYLATE 10 MG PO TABS: 10.0000 mg | ORAL_TABLET | Freq: Every day | ORAL | 3 refills | Status: DC

## 2017-01-20 NOTE — Assessment & Plan Note (Signed)
Well controlled. Continue norvasc.  

## 2017-01-20 NOTE — Assessment & Plan Note (Signed)
Scant appears physiologic  Wet prep done

## 2017-01-20 NOTE — Assessment & Plan Note (Signed)
Nystatin powder.

## 2017-01-20 NOTE — Progress Notes (Signed)
Subjective:  Patient ID: Monica Brennan, female    DOB: [birth year]  Age: 41 y.o. MRN: [id number]  CC: Hypertension  HPI Monica Brennan presents for   1. CHRONIC HYPERTENSION  Disease Monitoring  Chest pain: no   Dyspnea: no   Claudication: no   Medication compliance: yes  Medication Side Effects  Lightheadedness: yes   Urinary frequency: no   Edema: no   Impotence: no   2.  Vaginal itching: x 2 weeks. A little discharge. Itching around vaginal lips and also in groin. Primary itching is in R groin.   Social History  Substance Use Topics  . Smoking status: Never Smoker  . Smokeless tobacco: Never Used  . Alcohol use No   Outpatient Medications Prior to Visit  Medication Sig Dispense Refill  . amLODipine (NORVASC) 10 MG tablet Take 1 tablet (10 mg total) by mouth daily. 90 tablet 2  . clotrimazole (LOTRIMIN) 1 % cream Apply 1 application topically 2 (two) times daily. Apply to skin rash 30 g 2  . CLOTRIMAZOLE ANTI-FUNGAL 1 % cream APPLY 1 APPLICATION TOPICALLY 2 TIMES DAILY. APPLY TO SKIN RASH 28.35 g 2  . metroNIDAZOLE (FLAGYL) 500 MG tablet Take 1 tablet (500 mg total) by mouth 2 (two) times daily. (Patient not taking: Reported on 01/20/2017) 14 tablet 0   No facility-administered medications prior to visit.     ROS Review of Systems  Constitutional: Negative for chills and fever.  Eyes: Negative for visual disturbance.  Respiratory: Negative for shortness of breath.   Cardiovascular: Negative for chest pain.  Gastrointestinal: Negative for abdominal pain and blood in stool.  Genitourinary: Positive for vaginal discharge.  Musculoskeletal: Negative for arthralgias and back pain.  Skin: Negative for rash.  Allergic/Immunologic: Negative for immunocompromised state.  Hematological: Negative for adenopathy. Does not bruise/bleed easily.  Psychiatric/Behavioral: Negative for dysphoric mood and suicidal ideas.    Objective:  BP 121/81 (BP Location: Left Arm,  Patient Position: Sitting, Cuff Size: Large)   Pulse 87   Temp 99.3 F (37.4 C) (Oral)   Ht 5\' 6"  (1.676 m)   Wt 233 lb 3.2 oz (105.8 kg)   LMP 01/04/2017   SpO2 98%   BMI 37.64 kg/m   BP/Weight 01/20/2017 [id number]  Systolic BP [id number]  Diastolic BP 81 81 98  Wt. (Lbs) 233.2 229 -  BMI 37.64 36.98 -   Physical Exam  Constitutional: She is oriented to person, place, and time. She appears well-developed and well-nourished. No distress.  Obese   HENT:  Head: Normocephalic and atraumatic.  Cardiovascular: Normal rate, regular rhythm, normal heart sounds and intact distal pulses.   Pulmonary/Chest: Effort normal and breath sounds normal.  Genitourinary: Uterus normal.    Pelvic exam was performed with patient prone. There is rash on the right labia. There is no tenderness or lesion on the right labia. There is no rash, tenderness or lesion on the left labia. Cervix exhibits no motion tenderness, no discharge and no friability. Vaginal discharge (scant white discharge ) found.  Musculoskeletal: She exhibits no edema.  Lymphadenopathy:       Right: No inguinal adenopathy present.       Left: No inguinal adenopathy present.  Neurological: She is alert and oriented to person, place, and time.  Skin: Skin is warm and dry.  Psychiatric: She has a normal mood and affect.     Assessment & Plan:   Problem List Items Addressed This Visit  High   Hypertension (Chronic)   Relevant Medications   amLODipine (NORVASC) 10 MG tablet    Other Visit Diagnoses    Visit for screening mammogram    -  Primary   Relevant Orders   MM DIGITAL SCREENING BILATERAL   Vagina itching       Relevant Orders   Cervicovaginal ancillary only   Tinea cruris       Relevant Medications   nystatin (MYCOSTATIN/NYSTOP) powder      No orders of the defined types were placed in this encounter.   Follow-up: No Follow-up on file.   Boykin Nearing MD

## 2017-01-20 NOTE — Patient Instructions (Addendum)
Monica Brennan was seen today for hypertension.  Diagnoses and all orders for this visit:  Visit for screening mammogram -     MM DIGITAL SCREENING BILATERAL; Future  Essential hypertension -     amLODipine (NORVASC) 10 MG tablet; Take 1 tablet (10 mg total) by mouth daily.  Vagina itching -     Cervicovaginal ancillary only  Tinea cruris -     nystatin (MYCOSTATIN/NYSTOP) powder; Apply topically 2 (two) times daily. To groin   F/u in 6  months for HTN   Dr. Adrian Blackwater

## 2017-01-23 LAB — CERVICOVAGINAL ANCILLARY ONLY: Wet Prep (BD Affirm): NEGATIVE

## 2017-02-27 MED FILL — AMLODIPINE BESYLATE 10 MG T: 10 | 30 days supply | Qty: 30 | Fill #0

## 2017-02-27 MED FILL — NYSTOP 100,000 UNITS/GM PWD: 100000 | 30 days supply | Qty: 45 | Fill #0

## 2017-03-21 ENCOUNTER — Ambulatory Visit
Admission: RE | Admit: 2017-03-21 | Discharge: 2017-03-21 | Disposition: A | Discharge status: Home / Self Care | Payer: BLUE CROSS/BLUE SHIELD | Source: Ambulatory Visit | Attending: Family Medicine | Admitting: Family Medicine

## 2017-03-21 DIAGNOSIS — Z1231 Encounter for screening mammogram for malignant neoplasm of breast: Secondary | ICD-10-CM

## 2017-04-19 ENCOUNTER — Encounter: Payer: Self-pay | Admitting: Family Medicine

## 2017-04-27 MED FILL — AMLODIPINE BESYLATE 10 MG T: 10 | 30 days supply | Qty: 30 | Fill #1

## 2017-05-22 MED FILL — AMLODIPINE BESYLATE 10 MG T: 10 | 30 days supply | Qty: 30 | Fill #2

## 2017-07-27 MED FILL — AMLODIPINE BESYLATE 10 MG T: 10 | 30 days supply | Qty: 30 | Fill #3

## 2017-09-04 MED FILL — AMLODIPINE BESYLATE 10 MG T: 10 | 30 days supply | Qty: 30 | Fill #4

## 2017-10-30 MED FILL — AMLODIPINE BESYLATE 10 MG T: 10 | 30 days supply | Qty: 30 | Fill #5

## 2017-11-03 ENCOUNTER — Encounter: Payer: Self-pay | Admitting: Family Medicine

## 2017-11-03 ENCOUNTER — Other Ambulatory Visit: Payer: Self-pay

## 2017-11-03 ENCOUNTER — Ambulatory Visit: Payer: BLUE CROSS/BLUE SHIELD | Attending: Family Medicine | Admitting: Family Medicine

## 2017-11-03 VITALS — BP 130/86 | HR 70 | Temp 98.9°F | Resp 16 | Wt 233.6 lb

## 2017-11-03 DIAGNOSIS — Z79899 Other long term (current) drug therapy: Secondary | ICD-10-CM | POA: Diagnosis not present

## 2017-11-03 DIAGNOSIS — L2089 Other atopic dermatitis: Secondary | ICD-10-CM

## 2017-11-03 DIAGNOSIS — L2082 Flexural eczema: Secondary | ICD-10-CM | POA: Diagnosis not present

## 2017-11-03 DIAGNOSIS — I10 Essential (primary) hypertension: Secondary | ICD-10-CM | POA: Diagnosis not present

## 2017-11-03 MED ORDER — LOSARTAN POTASSIUM 25 MG PO TABS
25.0000 mg | ORAL_TABLET | Freq: Every day | ORAL | 2 refills | Status: DC
Start: 2017-11-03 — End: 2018-02-28

## 2017-11-03 MED ORDER — AMLODIPINE BESYLATE 10 MG PO TABS: 10.0000 mg | ORAL_TABLET | Freq: Every day | ORAL | 1 refills | Status: DC

## 2017-11-03 MED ORDER — TRIAMCINOLONE ACETONIDE 0.025 % EX OINT: 1.0000 "application " | TOPICAL_OINTMENT | Freq: Two times a day (BID) | CUTANEOUS | 0 refills | Status: DC

## 2017-11-03 MED FILL — LOSARTAN POTASSIUM 25 MG TA: 25 | 30 days supply | Qty: 30 | Fill #0

## 2017-11-03 NOTE — Progress Notes (Signed)
Subjective:  Patient ID: Monica Brennan, female    DOB: [birth year]  Age: 41 y.o. MRN: [id number]  CC: Hypertension   HPI Monica Brennan presents for hypertension follow up. HTN: She is not exercising and is not adherent to low salt diet.  She does not check BP at home. Cardiac symptoms none. Patient denies chest pain, chest pressure/discomfort and lower extremity edema.  Cardiovascular risk factors: hypertension, obesity (BMI >= 30 kg/m2) and sedentary lifestyle. Use of agents associated with hypertension: none. History of target organ damage: none. Eczema: Patient complains of rash. Onset of symptoms 4 years ago, and have been unchanged since that time. Location to bilateral antecubital and breast. Treatment modalities that have been used in the past include: OTC hydrocortisone cream.      Outpatient Medications Prior to Visit  Medication Sig Dispense Refill  . amLODipine (NORVASC) 10 MG tablet Take 1 tablet (10 mg total) by mouth daily. 90 tablet 3  . clotrimazole (LOTRIMIN) 1 % cream Apply 1 application topically 2 (two) times daily. Apply to skin rash (Patient not taking: Reported on 11/03/2017) 30 g 2  . nystatin (MYCOSTATIN/NYSTOP) powder Apply topically 2 (two) times daily. To groin (Patient not taking: Reported on 11/03/2017) 45 g 2   No facility-administered medications prior to visit.     ROS Review of Systems  Constitutional: Negative.   Eyes: Negative.   Respiratory: Negative.   Cardiovascular: Negative.   Skin: Positive for rash.   Objective:  BP 130/86 (BP Location: Left Arm, Patient Position: Sitting, Cuff Size: Large)   Pulse 70   Temp 98.9 F (37.2 C) (Oral)   Resp 16   Wt 233 lb 9.6 oz (106 kg)   LMP 10/18/2017   SpO2 100%   BMI 37.70 kg/m   BP/Weight 11/03/2017 01/20/2017 12/13/3233  Systolic BP 573 220 254  Diastolic BP 86 81 81  Wt. (Lbs) 233.6 233.2 229  BMI 37.7 37.64 36.98     Physical Exam  Constitutional: She appears well-developed and  well-nourished.  Eyes: Conjunctivae are normal. Pupils are equal, round, and reactive to light.  Neck: No JVD present.  Cardiovascular: Normal rate, regular rhythm, normal heart sounds and intact distal pulses.  Pulmonary/Chest: Effort normal and breath sounds normal.  Abdominal: Soft. Bowel sounds are normal. There is no tenderness.  Skin: Skin is warm and dry. Rash (bilateral antecubitals/ right breast) noted.  Nursing note and vitals reviewed.   Depression screen [name] 2/9 11/03/2017 01/20/2017 06/16/2016  Decreased Interest 0 0 0  Down, Depressed, Hopeless 0 0 0  PHQ - 2 Score 0 0 0  Altered sleeping 0 0 -  Tired, decreased energy 0 2 -  Change in appetite 0 0 -  Feeling bad or failure about yourself  0 0 -  Trouble concentrating 0 0 -  Moving slowly or fidgety/restless 0 0 -  Suicidal thoughts 0 0 -  PHQ-9 Score 0 2 -    GAD 7 : Generalized Anxiety Score 11/03/2017 01/20/2017 06/16/2016  Nervous, Anxious, on Edge 0 0 0  Control/stop worrying 0 0 1  Worry too much - different things 0 1 1  Trouble relaxing 0 0 0  Restless 0 0 0  Easily annoyed or irritable 0 0 0  Afraid - awful might happen 0 1 0  Total GAD 7 Score 0 2 2       Assessment & Plan:   1. Essential hypertension Schedule BP recheck in 2 weeks with clinic RN. -  Lipid Panel - amLODipine (NORVASC) 10 MG tablet; Take 1 tablet (10 mg total) by mouth daily.  Dispense: 90 tablet; Refill: 1 - losartan (COZAAR) 25 MG tablet; Take 1 tablet (25 mg total) by mouth daily.  Dispense: 30 tablet; Refill: 2 - CMP and Liver  2. Flexural atopic dermatitis Informed patient that triamcinolone should not be applied to breast area. - triamcinolone (KENALOG) 0.025 % ointment; Apply 1 application topically 2 (two) times daily.  Dispense: 454 g; Refill: 0      Follow-up: Return in about 2 weeks (around 11/17/2017) for BP check with [place].   Alfonse Spruce FNP

## 2017-11-03 NOTE — Progress Notes (Signed)
Pt needs RF on medication: Amlodipine Out of BP medication x 2 days

## 2017-11-03 NOTE — Patient Instructions (Addendum)
Schedule appointment for pap.   Managing Your Hypertension Hypertension is commonly called high blood pressure. This is when the force of your blood pressing against the walls of your arteries is too strong. Arteries are blood vessels that carry blood from your heart throughout your body. Hypertension forces the heart to work harder to pump blood, and may cause the arteries to become narrow or stiff. Having untreated or uncontrolled hypertension can cause heart attack, stroke, kidney disease, and other problems. What are blood pressure readings? A blood pressure reading consists of a higher number over a lower number. Ideally, your blood pressure should be below 120/80. The first ("top") number is called the systolic pressure. It is a measure of the pressure in your arteries as your heart beats. The second ("bottom") number is called the diastolic pressure. It is a measure of the pressure in your arteries as the heart relaxes. What does my blood pressure reading mean? Blood pressure is classified into four stages. Based on your blood pressure reading, your health care provider may use the following stages to determine what type of treatment you need, if any. Systolic pressure and diastolic pressure are measured in a unit called mm Hg. Normal  Systolic pressure: below 542.  Diastolic pressure: below 80. Elevated  Systolic pressure: 706-237.  Diastolic pressure: below 80. Hypertension stage 1  Systolic pressure: 628-315.  Diastolic pressure: 17-61. Hypertension stage 2  Systolic pressure: 607 or above.  Diastolic pressure: 90 or above. What health risks are associated with hypertension? Managing your hypertension is an important responsibility. Uncontrolled hypertension can lead to:  A heart attack.  A stroke.  A weakened blood vessel (aneurysm).  Heart failure.  Kidney damage.  Eye damage.  Metabolic syndrome.  Memory and concentration problems.  What changes can I make  to manage my hypertension? Hypertension can be managed by making lifestyle changes and possibly by taking medicines. Your health care provider will help you make a plan to bring your blood pressure within a normal range. Eating and drinking  Eat a diet that is high in fiber and potassium, and low in salt (sodium), added sugar, and fat. An example eating plan is called the DASH (Dietary Approaches to Stop Hypertension) diet. To eat this way: ? Eat plenty of fresh fruits and vegetables. Try to fill half of your plate at each meal with fruits and vegetables. ? Eat whole grains, such as whole wheat pasta, brown rice, or whole grain bread. Fill about one quarter of your plate with whole grains. ? Eat low-fat diary products. ? Avoid fatty cuts of meat, processed or cured meats, and poultry with skin. Fill about one quarter of your plate with lean proteins such as fish, chicken without skin, beans, eggs, and tofu. ? Avoid premade and processed foods. These tend to be higher in sodium, added sugar, and fat.  Reduce your daily sodium intake. Most people with hypertension should eat less than 1,500 mg of sodium a day.  Limit alcohol intake to no more than 1 drink a day for nonpregnant women and 2 drinks a day for men. One drink equals 12 oz of beer, 5 oz of wine, or 1 oz of hard liquor. Lifestyle  Work with your health care provider to maintain a healthy body weight, or to lose weight. Ask what an ideal weight is for you.  Get at least 30 minutes of exercise that causes your heart to beat faster (aerobic exercise) most days of the week. Activities may include walking,  swimming, or biking.  Include exercise to strengthen your muscles (resistance exercise), such as weight lifting, as part of your weekly exercise routine. Try to do these types of exercises for 30 minutes at least 3 days a week.  Do not use any products that contain nicotine or tobacco, such as cigarettes and e-cigarettes. If you need help  quitting, ask your health care provider.  Control any long-term (chronic) conditions you have, such as high cholesterol or diabetes. Monitoring  Monitor your blood pressure at home as told by your health care provider. Your personal target blood pressure may vary depending on your medical conditions, your age, and other factors.  Have your blood pressure checked regularly, as often as told by your health care provider. Working with your health care provider  Review all the medicines you take with your health care provider because there may be side effects or interactions.  Talk with your health care provider about your diet, exercise habits, and other lifestyle factors that may be contributing to hypertension.  Visit your health care provider regularly. Your health care provider can help you create and adjust your plan for managing hypertension. Will I need medicine to control my blood pressure? Your health care provider may prescribe medicine if lifestyle changes are not enough to get your blood pressure under control, and if:  Your systolic blood pressure is 130 or higher.  Your diastolic blood pressure is 80 or higher.  Take medicines only as told by your health care provider. Follow the directions carefully. Blood pressure medicines must be taken as prescribed. The medicine does not work as well when you skip doses. Skipping doses also puts you at risk for problems. Contact a health care provider if:  You think you are having a reaction to medicines you have taken.  You have repeated (recurrent) headaches.  You feel dizzy.  You have swelling in your ankles.  You have trouble with your vision. Get help right away if:  You develop a severe headache or confusion.  You have unusual weakness or numbness, or you feel faint.  You have severe pain in your chest or abdomen.  You vomit repeatedly.  You have trouble breathing. Summary  Hypertension is when the force of blood  pumping through your arteries is too strong. If this condition is not controlled, it may put you at risk for serious complications.  Your personal target blood pressure may vary depending on your medical conditions, your age, and other factors. For most people, a normal blood pressure is less than 120/80.  Hypertension is managed by lifestyle changes, medicines, or both. Lifestyle changes include weight loss, eating a healthy, low-sodium diet, exercising more, and limiting alcohol. This information is not intended to replace advice given to you by your health care provider. Make sure you discuss any questions you have with your health care provider. Document Released: 08/15/2012 Document Revised: 10/19/2016 Document Reviewed: 10/19/2016 Elsevier Interactive Patient Education  [employer].

## 2017-11-04 LAB — CMP AND LIVER
ALT: 14 IU/L (ref 0–32)
AST: 11 IU/L (ref 0–40)
Albumin: 4.3 g/dL (ref 3.5–5.5)
Alkaline Phosphatase: 86 IU/L (ref 39–117)
BILIRUBIN TOTAL: 0.2 mg/dL (ref 0.0–1.2)
BUN: 9 mg/dL (ref 6–24)
Bilirubin, Direct: 0.08 mg/dL (ref 0.00–0.40)
CALCIUM: 9.2 mg/dL (ref 8.7–10.2)
CO2: 22 mmol/L (ref 20–29)
CREATININE: 0.89 mg/dL (ref 0.57–1.00)
Chloride: 103 mmol/L (ref 96–106)
GFR calc non Af Amer: 80 mL/min/{1.73_m2} (ref >59)
GFR, EST AFRICAN AMERICAN: 92 mL/min/{1.73_m2} (ref >59)
GLUCOSE: 86 mg/dL (ref 65–99)
Potassium: 4.6 mmol/L (ref 3.5–5.2)
SODIUM: 140 mmol/L (ref 134–144)
TOTAL PROTEIN: 7.7 g/dL (ref 6.0–8.5)

## 2017-11-04 LAB — LIPID PANEL
CHOLESTEROL TOTAL: 144 mg/dL (ref 100–199)
Chol/HDL Ratio: 3.5 ratio (ref 0.0–4.4)
HDL: 41 mg/dL (ref >39)
LDL CALC: 91 mg/dL (ref 0–99)
Triglycerides: 62 mg/dL (ref 0–149)
VLDL Cholesterol Cal: 12 mg/dL (ref 5–40)

## 2017-11-10 ENCOUNTER — Telehealth: Payer: Self-pay

## 2017-11-10 NOTE — Telephone Encounter (Signed)
-----   Message from Alfonse Spruce, [place] sent at 11/08/2017  2:01 PM EST ----- Cholesterol levels are normal. Kidney function normal Liver function normal

## 2017-11-10 NOTE — Telephone Encounter (Signed)
CMA call regarding lab results    Patient did not answer but left a detailed message regarding lab results & to call back if have any questions

## 2017-11-21 ENCOUNTER — Ambulatory Visit: Payer: BLUE CROSS/BLUE SHIELD | Attending: Family Medicine | Admitting: *Deleted

## 2017-11-21 VITALS — BP 110/62 | HR 62

## 2017-11-21 DIAGNOSIS — I10 Essential (primary) hypertension: Secondary | ICD-10-CM | POA: Insufficient documentation

## 2017-11-21 MED FILL — TRIAMCINOLONE 0.025% OINT: 0.025 | 30 days supply | Qty: 454 | Fill #0

## 2017-11-21 NOTE — Progress Notes (Signed)
Pt arrived to [facility] alert and oriented, NAD.   Pt denies chest pain, SOB, HA, dizziness, or blurred vision or BLE swelling.  Verified medication with patient. Pt states medication was taken this morning.  Manual blood pressure reading: BP: 110/76

## 2017-12-11 MED FILL — AMLODIPINE BESYLATE 10 MG T: 10 | 30 days supply | Qty: 30 | Fill #6

## 2017-12-12 MED FILL — LOSARTAN POTASSIUM 25 MG TA: 25 | 30 days supply | Qty: 30 | Fill #1

## 2017-12-19 ENCOUNTER — Encounter (HOSPITAL_COMMUNITY): Payer: Self-pay

## 2018-02-28 ENCOUNTER — Encounter: Payer: Self-pay | Admitting: Nurse Practitioner

## 2018-02-28 ENCOUNTER — Ambulatory Visit: Payer: BLUE CROSS/BLUE SHIELD | Attending: Nurse Practitioner | Admitting: Nurse Practitioner

## 2018-02-28 VITALS — BP 141/98 | HR 63 | Temp 98.8°F | Ht 63.0 in | Wt 225.0 lb

## 2018-02-28 DIAGNOSIS — Z760 Encounter for issue of repeat prescription: Secondary | ICD-10-CM | POA: Diagnosis present

## 2018-02-28 DIAGNOSIS — Z9851 Tubal ligation status: Secondary | ICD-10-CM | POA: Insufficient documentation

## 2018-02-28 DIAGNOSIS — Z6839 Body mass index (BMI) 39.0-39.9, adult: Secondary | ICD-10-CM | POA: Insufficient documentation

## 2018-02-28 DIAGNOSIS — I10 Essential (primary) hypertension: Secondary | ICD-10-CM | POA: Diagnosis not present

## 2018-02-28 DIAGNOSIS — Z79899 Other long term (current) drug therapy: Secondary | ICD-10-CM | POA: Diagnosis not present

## 2018-02-28 DIAGNOSIS — Z8249 Family history of ischemic heart disease and other diseases of the circulatory system: Secondary | ICD-10-CM | POA: Insufficient documentation

## 2018-02-28 DIAGNOSIS — Z9889 Other specified postprocedural states: Secondary | ICD-10-CM | POA: Diagnosis not present

## 2018-02-28 DIAGNOSIS — Z801 Family history of malignant neoplasm of trachea, bronchus and lung: Secondary | ICD-10-CM | POA: Insufficient documentation

## 2018-02-28 DIAGNOSIS — Z833 Family history of diabetes mellitus: Secondary | ICD-10-CM | POA: Insufficient documentation

## 2018-02-28 DIAGNOSIS — Z1231 Encounter for screening mammogram for malignant neoplasm of breast: Secondary | ICD-10-CM

## 2018-02-28 MED ORDER — LOSARTAN POTASSIUM 25 MG PO TABS: 25.0000 mg | ORAL_TABLET | Freq: Every day | ORAL | 1 refills | Status: DC

## 2018-02-28 MED ORDER — AMLODIPINE BESYLATE 10 MG PO TABS: 10.0000 mg | ORAL_TABLET | Freq: Every day | ORAL | 1 refills | Status: DC

## 2018-02-28 MED FILL — AMLODIPINE BESYLATE 10 MG T: 10 | 90 days supply | Qty: 90 | Fill #0

## 2018-02-28 MED FILL — LOSARTAN POTASSIUM 25 MG TA: 25 | 90 days supply | Qty: 90 | Fill #0

## 2018-02-28 NOTE — Progress Notes (Signed)
Assessment & Plan:  [name] was seen today for establish care and medication refill.  Diagnoses and all orders for this visit:  Essential hypertension -     amLODipine (NORVASC) 10 MG tablet; Take 1 tablet (10 mg total) by mouth daily. -     losartan (COZAAR) 25 MG tablet; Take 1 tablet (25 mg total) by mouth daily. Continue all antihypertensives as prescribed.  Remember to bring in your blood pressure log with you for your follow up appointment.  DASH/Mediterranean Diets are healthier choices for HTN.    Severe obesity (BMI 35.0-39.9) with comorbidity ([place]) Discussed diet and exercise for person with BMI >25. Instructed: You must burn more calories than you eat. Losing 5 percent of your body weight should be considered a success. In the longer term, losing more than 15 percent of your body weight and staying at this weight is an extremely good result. However, keep in mind that even losing 5 percent of your body weight leads to important health benefits, so try not to get discouraged if you're not able to lose more than this. Will recheck weight in 3-6 months.  Breast cancer screening by mammogram -     MM SCREENING BREAST TOMO BILATERAL; Future    Patient has been counseled on age-appropriate routine health concerns for screening and prevention. These are reviewed and up-to-date. Referrals have been placed accordingly. Immunizations are up-to-date or declined.    Subjective:   Chief Complaint  Patient presents with  . Establish Care    Pt is here to establish care for hypertension.   . Medication Refill    Ran out of BP medication for a month and stated the pharmacy told her she need to be seen due to expired date.    HPI Monica Brennan 42 y.o. female presents to office today to establish care.  Essential Hypertension Chronic. Not well controlled today however she has been out of her blood pressure medication for over a month. She endorses medication compliance taking  norvasc 10mg  and cozaar 25mg . She does not routinely monitor her blood pressure at home. Denies chest pain, shortness of breath, palpitations, lightheadedness, dizziness, headaches or BLE edema.  BP Readings from Last 3 Encounters:  02/28/18 (!) 141/98  11/21/17 110/62  11/03/17 130/86    Review of Systems  Constitutional: Negative for fever, malaise/fatigue and weight loss.  HENT: Negative.  Negative for nosebleeds.   Eyes: Negative.  Negative for blurred vision, double vision and photophobia.  Respiratory: Negative.  Negative for cough and shortness of breath.   Cardiovascular: Negative.  Negative for chest pain, palpitations and leg swelling.  Gastrointestinal: Negative.  Negative for heartburn, nausea and vomiting.  Musculoskeletal: Negative.  Negative for myalgias.  Neurological: Negative.  Negative for dizziness, focal weakness, seizures and headaches.  Psychiatric/Behavioral: Negative.  Negative for suicidal ideas.    Past Medical History:  Diagnosis Date  . Hypertension Dx 2006   previously, on lisinopril. stopped lisinopril when medicaid ran out in 2007.     Past Surgical History:  Procedure Laterality Date  . CESAREAN SECTION  2006  . TUBAL LIGATION  2006    Family History  Problem Relation Age of Onset  . Hypertension Mother   . Heart disease Mother   . Diabetes Brother   . Diabetes Maternal Aunt   . Cancer Maternal Aunt        lung   . Diabetes Son   . Thyroid disease Son   . ADD / ADHD  Daughter   . ADD / ADHD Son   . Epilepsy Son     Social History Reviewed with no changes to be made today.   Outpatient Medications Prior to Visit  Medication Sig Dispense Refill  . amLODipine (NORVASC) 10 MG tablet Take 1 tablet (10 mg total) by mouth daily. 90 tablet 1  . losartan (COZAAR) 25 MG tablet Take 1 tablet (25 mg total) by mouth daily. 30 tablet 2  . triamcinolone (KENALOG) 0.025 % ointment Apply 1 application topically 2 (two) times daily. (Patient not  taking: Reported on 02/28/2018) 454 g 0   No facility-administered medications prior to visit.     No Known Allergies     Objective:    BP (!) 141/98 (BP Location: Left Arm, Patient Position: Sitting, Cuff Size: Normal)   Pulse 63   Temp 98.8 F (37.1 C) (Oral)   Ht 5\' 3"  (1.6 m)   Wt 225 lb (102.1 kg)   LMP 02/25/2018   SpO2 99%   BMI 39.86 kg/m  Wt Readings from Last 3 Encounters:  02/28/18 225 lb (102.1 kg)  11/03/17 233 lb 9.6 oz (106 kg)  01/20/17 233 lb 3.2 oz (105.8 kg)    Physical Exam  Constitutional: She is oriented to person, place, and time. She appears well-developed and well-nourished. She is cooperative.  HENT:  Head: Normocephalic and atraumatic.  Eyes: EOM are normal.  Neck: Normal range of motion.  Cardiovascular: Normal rate, regular rhythm and normal heart sounds. Exam reveals no gallop and no friction rub.  No murmur heard. Pulmonary/Chest: Effort normal and breath sounds normal. No tachypnea. No respiratory distress. She has no decreased breath sounds. She has no wheezes. She has no rhonchi. She has no rales. She exhibits no tenderness.  Abdominal: Soft. Bowel sounds are normal.  Musculoskeletal: Normal range of motion. She exhibits no edema.  Neurological: She is alert and oriented to person, place, and time. Coordination normal.  Skin: Skin is warm and dry.  Psychiatric: She has a normal mood and affect. Her behavior is normal. Judgment and thought content normal.  Nursing note and vitals reviewed.        Patient has been counseled extensively about nutrition and exercise as well as the importance of adherence with medications and regular follow-up. The patient was given clear instructions to go to ER or return to medical center if symptoms don't improve, worsen or new problems develop. The patient verbalized understanding.   Follow-up: Return for PAP SMEAR.   Gildardo Pounds, FNP-BC [facility] and [place], [place]   02/28/2018, 3:45 PM

## 2018-02-28 NOTE — Patient Instructions (Signed)
Scabies, Adult Scabies is a skin condition that happens when very small insects get under the skin (infestation). This causes a rash and severe itchiness. Scabies can spread from person to person (is contagious). If you get scabies, it is common for others in your household to get scabies too. With proper treatment, symptoms usually go away in 2-4 weeks. Scabies usually does not cause lasting problems. What are the causes? This condition is caused by mites (Sarcoptes scabiei, or human itch mites) that can only be seen with a microscope. The mites get into the top layer of skin and lay eggs. Scabies can spread from person to person through:  Close contact with a person who has scabies.  Contact with infested items, such as towels, bedding, or clothing.  What increases the risk? This condition is more likely to develop in:  People who live in nursing homes and other extended-care facilities.  People who have sexual contact with a partner who has scabies.  Young children who attend child care facilities.  People who care for others who are at increased risk for scabies.  What are the signs or symptoms? Symptoms of this condition may include:  Severe itchiness. This is often worse at night.  A rash that includes tiny red bumps or blisters. The rash commonly occurs on the wrist, elbow, armpit, fingers, waist, groin, or buttocks. Bumps may form a line (burrow) in some areas.  Skin irritation. This can include scaly patches or sores.  How is this diagnosed? This condition is diagnosed with a physical exam. Your health care provider will look closely at your skin. In some cases, your health care provider may take a sample of your affected skin (skin scraping) and have it examined under a microscope. How is this treated? This condition may be treated with:  Medicated cream or lotion that kills the mites. This is spread on the entire body and left on for several hours. Usually, one treatment  with medicated cream or lotion is enough to kill all of the mites. In severe cases, the treatment may be repeated.  Medicated cream that relieves itching.  Medicines that help to relieve itching.  Medicines that kill the mites. This treatment is rarely used.  Follow these instructions at home:  Medicines  Take or apply over-the-counter and prescription medicines as told by your health care provider.  Apply medicated cream or lotion as told by your health care provider.  Do not wash off the medicated cream or lotion until the necessary amount of time has passed. Skin Care  Avoid scratching your affected skin.  Keep your fingernails closely trimmed to reduce injury from scratching.  Take cool baths or apply cool washcloths to help reduce itching. General instructions  Clean all items that you recently had contact with, including bedding, clothing, and furniture. Do this on the same day that your treatment starts. ? Use hot water when you wash items. ? Place unwashable items into closed, airtight plastic bags for at least 3 days. The mites cannot live for more than 3 days away from human skin. ? Vacuum furniture and mattresses that you use.  Make sure that other people who may have been infested are examined by a health care provider. These include members of your household and anyone who may have had contact with infested items.  Keep all follow-up visits as told by your health care provider. This is important. Contact a health care provider if:  You have itching that does not go away   after 4 weeks of treatment.  You continue to develop new bumps or burrows.  You have redness, swelling, or pain in your rash area after treatment.  You have fluid, blood, or pus coming from your rash. This information is not intended to replace advice given to you by your health care provider. Make sure you discuss any questions you have with your health care provider. Document Released:  08/12/2015 Document Revised: 04/28/2016 Document Reviewed: 06/23/2015 Elsevier Interactive Patient Education  2018 [employer]  

## 2018-03-19 ENCOUNTER — Other Ambulatory Visit: Payer: Self-pay | Admitting: Nurse Practitioner

## 2018-03-19 DIAGNOSIS — Z1231 Encounter for screening mammogram for malignant neoplasm of breast: Secondary | ICD-10-CM

## 2018-04-10 ENCOUNTER — Ambulatory Visit: Payer: BLUE CROSS/BLUE SHIELD | Attending: Nurse Practitioner | Admitting: Nurse Practitioner

## 2018-04-10 ENCOUNTER — Other Ambulatory Visit (HOSPITAL_COMMUNITY)
Admission: RE | Admit: 2018-04-10 | Discharge: 2018-04-10 | Disposition: A | Discharge status: Home / Self Care | Payer: BLUE CROSS/BLUE SHIELD | Source: Ambulatory Visit | Attending: Nurse Practitioner | Admitting: Nurse Practitioner

## 2018-04-10 ENCOUNTER — Other Ambulatory Visit: Payer: Self-pay | Admitting: Nurse Practitioner

## 2018-04-10 ENCOUNTER — Encounter: Payer: Self-pay | Admitting: Nurse Practitioner

## 2018-04-10 VITALS — BP 144/88 | HR 72 | Temp 99.8°F | Ht 63.0 in | Wt 229.0 lb

## 2018-04-10 DIAGNOSIS — N898 Other specified noninflammatory disorders of vagina: Secondary | ICD-10-CM | POA: Diagnosis not present

## 2018-04-10 DIAGNOSIS — Z01419 Encounter for gynecological examination (general) (routine) without abnormal findings: Secondary | ICD-10-CM | POA: Insufficient documentation

## 2018-04-10 DIAGNOSIS — Z9889 Other specified postprocedural states: Secondary | ICD-10-CM | POA: Diagnosis not present

## 2018-04-10 DIAGNOSIS — Z79899 Other long term (current) drug therapy: Secondary | ICD-10-CM | POA: Insufficient documentation

## 2018-04-10 DIAGNOSIS — Z8249 Family history of ischemic heart disease and other diseases of the circulatory system: Secondary | ICD-10-CM | POA: Insufficient documentation

## 2018-04-10 DIAGNOSIS — N920 Excessive and frequent menstruation with regular cycle: Secondary | ICD-10-CM | POA: Diagnosis not present

## 2018-04-10 DIAGNOSIS — N83209 Unspecified ovarian cyst, unspecified side: Secondary | ICD-10-CM | POA: Insufficient documentation

## 2018-04-10 DIAGNOSIS — D259 Leiomyoma of uterus, unspecified: Secondary | ICD-10-CM | POA: Diagnosis not present

## 2018-04-10 DIAGNOSIS — Z8349 Family history of other endocrine, nutritional and metabolic diseases: Secondary | ICD-10-CM | POA: Insufficient documentation

## 2018-04-10 DIAGNOSIS — Z801 Family history of malignant neoplasm of trachea, bronchus and lung: Secondary | ICD-10-CM | POA: Diagnosis not present

## 2018-04-10 DIAGNOSIS — Z833 Family history of diabetes mellitus: Secondary | ICD-10-CM | POA: Insufficient documentation

## 2018-04-10 DIAGNOSIS — R102 Pelvic and perineal pain: Secondary | ICD-10-CM | POA: Insufficient documentation

## 2018-04-10 DIAGNOSIS — I10 Essential (primary) hypertension: Secondary | ICD-10-CM | POA: Diagnosis not present

## 2018-04-10 MED ORDER — AMLODIPINE BESYLATE 10 MG PO TABS: 10.0000 mg | ORAL_TABLET | Freq: Every day | ORAL | 1 refills | Status: DC

## 2018-04-10 MED ORDER — LOSARTAN POTASSIUM 25 MG PO TABS: 25.0000 mg | ORAL_TABLET | Freq: Every day | ORAL | 1 refills | Status: DC

## 2018-04-10 NOTE — Progress Notes (Signed)
Assessment & Plan:  Monica Brennan was seen today for gynecologic exam and vaginal discharge.  Diagnoses and all orders for this visit:  Well woman exam with routine gynecological exam -     Cytology - PAP  Menorrhagia with regular cycle -     CBC -     US Transvaginal Non-OB; Future -     US PELVIS (TRANSABDOMINAL ONLY); Future  Pelvic pain -     US Transvaginal Non-OB; Future -     US PELVIS (TRANSABDOMINAL ONLY); Future  Essential hypertension -     amLODipine (NORVASC) 10 MG tablet; Take 1 tablet (10 mg total) by mouth daily. -     losartan (COZAAR) 25 MG tablet; Take 1 tablet (25 mg total) by mouth daily. Continue all antihypertensives as prescribed.  Remember to bring in your blood pressure log with you for your follow up appointment.  DASH/Mediterranean Diets are healthier choices for HTN.    Patient has been counseled on age-appropriate routine health concerns for screening and prevention. These are reviewed and up-to-date. Referrals have been placed accordingly. Immunizations are up-to-date or declined.    Subjective:   Chief Complaint  Patient presents with  . Gynecologic Exam    Pt. is here for a pap smear.  . Vaginal Discharge    Pt. stated she have a vaginal discharge.    HPI Monica Brennan 42 y.o. female presents to office today for routine PAP.    Abnormal Uterine Bleeding Patient complains of menorrhagia. Symptoms began several years ago. Patient describes symptoms of  menorrhagia (severe) and pelvic pain (moderate). Symptoms occur with periods, which are usually 28-30 days apart and quite regular. Patient denies depression and insomnia. Evaluation to date includes pelvic US (abnormal: fibroids/ovarian cyst). Treatment to date includes nothing. The patient is sexually active.    Review of Systems  Constitutional: Negative.  Negative for chills, fever, malaise/fatigue and weight loss.  Respiratory: Negative.  Negative for cough, shortness of breath and  wheezing.   Cardiovascular: Negative.  Negative for chest pain, orthopnea and leg swelling.  Gastrointestinal: Negative for abdominal pain (right sided pelvic pain).  Genitourinary: Negative for flank pain.       Vaginal discharge occurring before and after menstrual cycle  Skin: Negative.  Negative for rash.  Psychiatric/Behavioral: Negative for suicidal ideas.    Past Medical History:  Diagnosis Date  . Hypertension Dx 2006   previously, on lisinopril. stopped lisinopril when medicaid ran out in 2007.     Past Surgical History:  Procedure Laterality Date  . CESAREAN SECTION  2006  . TUBAL LIGATION  2006    Family History  Problem Relation Age of Onset  . Hypertension Mother   . Heart disease Mother   . Diabetes Brother   . Diabetes Maternal Aunt   . Cancer Maternal Aunt        lung   . Diabetes Son   . Thyroid disease Son   . ADD / ADHD Daughter   . ADD / ADHD Son   . Epilepsy Son     Social History Reviewed with no changes to be made today.   Outpatient Medications Prior to Visit  Medication Sig Dispense Refill  . amLODipine (NORVASC) 10 MG tablet Take 1 tablet (10 mg total) by mouth daily. 90 tablet 1  . losartan (COZAAR) 25 MG tablet Take 1 tablet (25 mg total) by mouth daily. 90 tablet 1   No facility-administered medications prior to visit.  No Known Allergies     Objective:    BP (!) 144/88 (BP Location: Left Arm, Patient Position: Sitting, Cuff Size: Normal)   Pulse 72   Temp 99.8 F (37.7 C) (Oral)   Ht 5\' 3"  (1.6 m)   Wt 229 lb (103.9 kg)   SpO2 99%   BMI 40.57 kg/m  Wt Readings from Last 3 Encounters:  04/10/18 229 lb (103.9 kg)  02/28/18 225 lb (102.1 kg)  11/03/17 233 lb 9.6 oz (106 kg)    Physical Exam  Constitutional: She is oriented to person, place, and time. She appears well-developed and well-nourished.  HENT:  Head: Normocephalic.  Cardiovascular: Normal rate, regular rhythm and normal heart sounds.  Pulmonary/Chest:  Effort normal and breath sounds normal.  Abdominal: Soft. Bowel sounds are normal. Hernia confirmed negative in the right inguinal area and confirmed negative in the left inguinal area.  Genitourinary: Rectum normal and uterus normal. Rectal exam shows no external hemorrhoid. No labial fusion. There is no rash, tenderness, lesion or injury on the right labia. There is no rash, tenderness, lesion or injury on the left labia. Uterus is not deviated and not enlarged. Cervix exhibits no motion tenderness and no friability. Right adnexum displays no mass, no tenderness and no fullness. Left adnexum displays no mass, no tenderness and no fullness. No erythema, tenderness or bleeding in the vagina. No foreign body in the vagina. No signs of injury around the vagina. Vaginal discharge found.  Lymphadenopathy: No inguinal adenopathy noted on the right or left side.       Right: No inguinal adenopathy present.       Left: No inguinal adenopathy present.  Neurological: She is alert and oriented to person, place, and time.  Skin: Skin is warm and dry.  Psychiatric: She has a normal mood and affect. Her behavior is normal. Judgment and thought content normal.         Patient has been counseled extensively about nutrition and exercise as well as the importance of adherence with medications and regular follow-up. The patient was given clear instructions to go to ER or return to medical center if symptoms don't improve, worsen or new problems develop. The patient verbalized understanding.   Follow-up: Return in about 3 months (around 07/11/2018).   Gildardo Pounds, FNP-BC [facility] and [place], [place]   04/10/2018, 9:38 AM

## 2018-04-10 NOTE — Patient Instructions (Signed)

## 2018-04-11 ENCOUNTER — Other Ambulatory Visit: Payer: Self-pay | Admitting: Nurse Practitioner

## 2018-04-11 LAB — CERVICOVAGINAL ANCILLARY ONLY
Bacterial vaginitis: POSITIVE — AB
CHLAMYDIA, DNA PROBE: NEGATIVE
Candida vaginitis: NEGATIVE
NEISSERIA GONORRHEA: NEGATIVE
TRICH (WINDOWPATH): POSITIVE — AB

## 2018-04-11 LAB — CBC
HEMATOCRIT: 36.3 % (ref 34.0–46.6)
HEMOGLOBIN: 12.9 g/dL (ref 11.1–15.9)
MCH: 27.9 pg (ref 26.6–33.0)
MCHC: 35.5 g/dL (ref 31.5–35.7)
MCV: 78 fL — AB (ref 79–97)
PLATELETS: 387 10*3/uL — AB (ref 150–379)
RBC: 4.63 x10E6/uL (ref 3.77–5.28)
RDW: 16 % — ABNORMAL HIGH (ref 12.3–15.4)
WBC: 7.4 10*3/uL (ref 3.4–10.8)

## 2018-04-11 MED ORDER — METRONIDAZOLE 500 MG PO TABS: 500.0000 mg | ORAL_TABLET | Freq: Two times a day (BID) | ORAL | 0 refills | Status: AC

## 2018-04-12 LAB — CYTOLOGY - PAP
DIAGNOSIS: NEGATIVE
HPV (WINDOPATH): NOT DETECTED

## 2018-04-12 MED FILL — metroNIDAZOLE 500 MG TABS: 500 | 7 days supply | Qty: 14 | Fill #0

## 2018-04-13 ENCOUNTER — Telehealth: Payer: Self-pay

## 2018-04-13 NOTE — Telephone Encounter (Signed)
CMA called patient to try to schedule patient Ultrasound scheduled. No answer and left a VM for patient to call back.

## 2018-04-16 ENCOUNTER — Ambulatory Visit
Admission: RE | Admit: 2018-04-16 | Discharge: 2018-04-16 | Disposition: A | Discharge status: Home / Self Care | Payer: BLUE CROSS/BLUE SHIELD | Source: Ambulatory Visit | Attending: Nurse Practitioner | Admitting: Nurse Practitioner

## 2018-04-16 DIAGNOSIS — Z1231 Encounter for screening mammogram for malignant neoplasm of breast: Secondary | ICD-10-CM

## 2018-05-04 ENCOUNTER — Telehealth: Payer: Self-pay | Admitting: *Deleted

## 2018-05-04 NOTE — Telephone Encounter (Signed)
Patient verified DOB Patient is scheduled for [place] on 05/23/18 at 11:00 am. Patient provided Scheduling number for any concerns or changes. Patient advised to arrive at 10:45 with full bladder.

## 2018-05-23 ENCOUNTER — Ambulatory Visit (HOSPITAL_COMMUNITY)
Admission: RE | Admit: 2018-05-23 | Discharge: 2018-05-23 | Disposition: A | Discharge status: Home / Self Care | Payer: BLUE CROSS/BLUE SHIELD | Source: Ambulatory Visit | Attending: Nurse Practitioner | Admitting: Nurse Practitioner

## 2018-05-23 DIAGNOSIS — N920 Excessive and frequent menstruation with regular cycle: Secondary | ICD-10-CM

## 2018-05-23 DIAGNOSIS — R102 Pelvic and perineal pain: Secondary | ICD-10-CM

## 2018-05-23 DIAGNOSIS — D259 Leiomyoma of uterus, unspecified: Secondary | ICD-10-CM | POA: Diagnosis not present

## 2018-05-30 MED FILL — LOSARTAN POTASSIUM 25 MG TA: 25 | 90 days supply | Qty: 90 | Fill #1

## 2018-05-30 MED FILL — AMLODIPINE BESYLATE 10 MG T: 10 | 90 days supply | Qty: 90 | Fill #1

## 2018-08-22 MED FILL — LOSARTAN POTASSIUM 25 MG TA: 25 | 30 days supply | Qty: 30 | Fill #2

## 2018-08-22 MED FILL — AMLODIPINE BESYLATE 10 MG T: 10 | 90 days supply | Qty: 90 | Fill #0

## 2018-10-31 ENCOUNTER — Telehealth: Payer: Self-pay | Admitting: Nurse Practitioner

## 2018-10-31 NOTE — Telephone Encounter (Signed)
Pt came in to request a letter for work stating that she completed a PAP smear in our office on 04/10/2018. Please call patient when letter is ready for pickup

## 2018-11-05 ENCOUNTER — Encounter: Payer: Self-pay | Admitting: Nurse Practitioner

## 2018-11-05 NOTE — Telephone Encounter (Signed)
Letter is available to be printed from her chart. She can pick up at her convenience.

## 2019-01-11 ENCOUNTER — Encounter (HOSPITAL_COMMUNITY): Payer: Self-pay

## 2019-01-11 ENCOUNTER — Emergency Department (HOSPITAL_COMMUNITY)
Admission: EM | Admit: 2019-01-11 | Discharge: 2019-01-11 | Disposition: A | Discharge status: Home / Self Care | Payer: BLUE CROSS/BLUE SHIELD | Attending: Emergency Medicine | Admitting: Emergency Medicine

## 2019-01-11 DIAGNOSIS — I10 Essential (primary) hypertension: Secondary | ICD-10-CM | POA: Insufficient documentation

## 2019-01-11 DIAGNOSIS — K029 Dental caries, unspecified: Secondary | ICD-10-CM | POA: Diagnosis not present

## 2019-01-11 DIAGNOSIS — K047 Periapical abscess without sinus: Secondary | ICD-10-CM | POA: Diagnosis not present

## 2019-01-11 DIAGNOSIS — Z79899 Other long term (current) drug therapy: Secondary | ICD-10-CM | POA: Diagnosis not present

## 2019-01-11 DIAGNOSIS — K0889 Other specified disorders of teeth and supporting structures: Secondary | ICD-10-CM | POA: Diagnosis present

## 2019-01-11 MED ORDER — AMOXICILLIN 500 MG PO CAPS: 500.0000 mg | ORAL_CAPSULE | Freq: Three times a day (TID) | ORAL | 0 refills | Status: DC

## 2019-01-11 MED FILL — AMOXICILLIN 500 MG CAPSULE: 500 | 10 days supply | Qty: 30 | Fill #0

## 2019-01-11 NOTE — ED Triage Notes (Signed)
Pt woke up this morning c/o of front dental pain. Patient states she has an abscess and it became sore yesterday.   Patient took 3 benadryl this morning about 6:45  A/Ox4

## 2019-01-11 NOTE — ED Provider Notes (Signed)
[facility] DEPT Provider Note   CSN: [id number] Arrival date & time: 01/11/19  1349     History   Chief Complaint Chief Complaint  Patient presents with  . Dental Pain    HPI Monica Brennan is a 43 y.o. female who presents to the ED with dental pain. Patient reports she woke with the pain today. Patient reports abscess to the front dental area. Right upper dental pain. Patient took Benadryl this morning.   HPI  Past Medical History:  Diagnosis Date  . Hypertension Dx 2006   previously, on lisinopril. stopped lisinopril when medicaid ran out in 2007.     Patient Active Problem List   Diagnosis Date Noted  . Tinea cruris 01/20/2017  . Fibroid uterus 06/24/2016  . 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 Right-sided low back pain without sciatica 06/16/2016  . Pelvic pain in female 06/16/2016  . Vaginal discharge 06/16/2016  . Menopausal symptoms 08/28/2015  . Hypertension 09/09/2014    Past Surgical History:  Procedure Laterality Date  . CESAREAN SECTION  2006  . TUBAL LIGATION  2006     OB History    Gravida  3   Para  2   Term  2   Preterm      AB  1   Living  2     SAB  1   TAB      Ectopic      Multiple      Live Births               Home Medications    Prior to Admission medications   Medication Sig Start Date End Date Taking? Authorizing Provider  amLODipine (NORVASC) 10 MG tablet Take 1 tablet (10 mg total) by mouth daily. 04/10/18   Gildardo Pounds, NP  amoxicillin (AMOXIL) 500 MG capsule Take 1 capsule (500 mg total) by mouth 3 (three) times daily. 01/11/19   Ashley Murrain, NP  losartan (COZAAR) 25 MG tablet Take 1 tablet (25 mg total) by mouth daily. 04/10/18   Gildardo Pounds, NP    Family History Family History  Problem Relation Age of Onset  . Hypertension Mother   . Heart disease Mother   . Diabetes Brother   . Diabetes Maternal Aunt   . Cancer Maternal Aunt        lung   . Diabetes Son   .  Thyroid disease Son   . ADD / ADHD Daughter   . ADD / ADHD Son   . Epilepsy Son     Social History Social History   Tobacco Use  . Smoking status: Never Smoker  . Smokeless tobacco: Never Used  Substance Use Topics  . Alcohol use: No  . Drug use: No     Allergies   Patient has no known allergies.   Review of Systems Review of Systems  Constitutional: Negative for chills and fever.  HENT: Positive for facial swelling. Negative for sore throat and trouble swallowing.   All other systems reviewed and are negative.    Physical Exam Updated Vital Signs BP (!) 154/106 (BP Location: Left Arm)   Pulse 90   Temp 98.4 F (36.9 C) (Oral)   Resp 17   LMP 01/11/2019   SpO2 100%   Physical Exam Vitals signs and nursing note reviewed.  Constitutional:      Appearance: She is well-developed.  HENT:     Head: Normocephalic.     Right  Ear: Tympanic membrane normal.     Left Ear: Tympanic membrane normal.     Mouth/Throat:     Mouth: Mucous membranes are moist.     Dentition: Dental tenderness, dental caries and dental abscesses present.     Pharynx: Uvula midline. No pharyngeal swelling or posterior oropharyngeal erythema.   Eyes:     Conjunctiva/sclera: Conjunctivae normal.  Neck:     Musculoskeletal: Neck supple.  Cardiovascular:     Rate and Rhythm: Normal rate.  Pulmonary:     Effort: Pulmonary effort is normal.  Abdominal:     Palpations: Abdomen is soft.     Tenderness: There is no abdominal tenderness.  Musculoskeletal: Normal range of motion.  Lymphadenopathy:     Cervical: Cervical adenopathy present.  Skin:    General: Skin is warm and dry.  Neurological:     Mental Status: She is alert and oriented to person, place, and time.     Cranial Nerves: No cranial nerve deficit.  Psychiatric:        Mood and Affect: Mood normal.      ED Treatments / Results  Labs (all labs ordered are listed, but only abnormal results are displayed) Labs Reviewed - No  data to display  Radiology No results found.  Procedures Procedures (including critical care time)  Medications Ordered in ED Medications - No data to display   Initial Impression / Assessment and Plan / ED Course  I have reviewed the triage vital signs and the nursing notes. Patient with toothache. Small abscess upper right dental area.  Exam unconcerning for Ludwig's angina or spread of infection.  Will treat with penicillin and anti-inflammatories medicine.  Urged patient to follow-up with dentist.    Final Clinical Impressions(s) / ED Diagnoses   Final diagnoses:  Dental caries  Dental abscess    ED Discharge Orders         Ordered    amoxicillin (AMOXIL) 500 MG capsule  3 times daily     01/11/19 1423           Debroah Baller [place], [place] 01/11/19 1427    Margette Fast, MD 01/11/19 [phone]

## 2019-01-11 NOTE — Discharge Instructions (Addendum)
Continue to take the ibuprofen and benadryl as needed. Follow up with your dentist on Monday. Return here as needed.

## 2019-02-13 ENCOUNTER — Telehealth: Payer: Self-pay | Admitting: Nurse Practitioner

## 2019-02-13 ENCOUNTER — Other Ambulatory Visit: Payer: Self-pay | Admitting: Nurse Practitioner

## 2019-02-13 ENCOUNTER — Ambulatory Visit: Payer: BLUE CROSS/BLUE SHIELD | Admitting: Nurse Practitioner

## 2019-02-13 DIAGNOSIS — I10 Essential (primary) hypertension: Secondary | ICD-10-CM

## 2019-02-13 MED ORDER — LOSARTAN POTASSIUM 25 MG PO TABS: 25.0000 mg | ORAL_TABLET | Freq: Every day | ORAL | 1 refills | Status: DC

## 2019-02-13 MED ORDER — AMLODIPINE BESYLATE 10 MG PO TABS: 10.0000 mg | ORAL_TABLET | Freq: Every day | ORAL | 1 refills | Status: DC

## 2019-02-13 MED FILL — LOSARTAN POTASSIUM 25 MG TA: 25 | 30 days supply | Qty: 30 | Fill #0

## 2019-02-13 MED FILL — AMLODIPINE BESYLATE 10 MG T: 10 | 30 days supply | Qty: 30 | Fill #0

## 2019-02-13 NOTE — Telephone Encounter (Signed)
1) Medication(s) Requested (by name): amoxicillin (AMOXIL) 500   2) Pharmacy of Choice: [facility] 3) Special Requests:   Approved medications will be sent to the pharmacy, we will reach out if there is an issue.  Requests made after 3pm may not be addressed until the following business day!  If a patient is unsure of the name of the medication(s) please note and ask patient to call back when they are able to provide all info, do not send to responsible party until all information is available!

## 2019-02-14 NOTE — Telephone Encounter (Signed)
This patient has not been seen since 04/10/18, if she feels she needs antibiotics she needs to be seen here sooner or go to urgent care.

## 2019-03-19 ENCOUNTER — Ambulatory Visit: Payer: BLUE CROSS/BLUE SHIELD | Admitting: Nurse Practitioner

## 2019-03-26 ENCOUNTER — Encounter: Payer: Self-pay | Admitting: Nurse Practitioner

## 2019-03-26 ENCOUNTER — Ambulatory Visit: Payer: BLUE CROSS/BLUE SHIELD | Attending: Nurse Practitioner | Admitting: Nurse Practitioner

## 2019-03-26 ENCOUNTER — Other Ambulatory Visit: Payer: Self-pay

## 2019-03-26 VITALS — BP 135/81 | HR 83 | Temp 98.6°F | Ht 63.0 in | Wt 238.0 lb

## 2019-03-26 DIAGNOSIS — B3789 Other sites of candidiasis: Secondary | ICD-10-CM

## 2019-03-26 DIAGNOSIS — I10 Essential (primary) hypertension: Secondary | ICD-10-CM | POA: Diagnosis not present

## 2019-03-26 DIAGNOSIS — L309 Dermatitis, unspecified: Secondary | ICD-10-CM

## 2019-03-26 DIAGNOSIS — Z1211 Encounter for screening for malignant neoplasm of colon: Secondary | ICD-10-CM

## 2019-03-26 DIAGNOSIS — Z6841 Body Mass Index (BMI) 40.0 and over, adult: Secondary | ICD-10-CM

## 2019-03-26 DIAGNOSIS — Z1231 Encounter for screening mammogram for malignant neoplasm of breast: Secondary | ICD-10-CM

## 2019-03-26 MED ORDER — TRIAMCINOLONE ACETONIDE 0.1 % EX CREA: 1.0000 "application " | TOPICAL_CREAM | Freq: Two times a day (BID) | CUTANEOUS | 0 refills | Status: DC

## 2019-03-26 MED ORDER — NYSTATIN 100000 UNIT/GM EX CREA
1.0000 | TOPICAL_CREAM | Freq: Two times a day (BID) | CUTANEOUS | 0 refills | Status: DC
Start: 2019-03-26 — End: 2020-02-03

## 2019-03-26 MED ORDER — AMLODIPINE BESYLATE 10 MG PO TABS: 10.0000 mg | ORAL_TABLET | Freq: Every day | ORAL | 1 refills | Status: DC

## 2019-03-26 MED ORDER — LOSARTAN POTASSIUM 25 MG PO TABS: 25.0000 mg | ORAL_TABLET | Freq: Every day | ORAL | 1 refills | Status: DC

## 2019-03-26 MED FILL — AMLODIPINE BESYLATE 10 MG T: 10 | 30 days supply | Qty: 30 | Fill #0

## 2019-03-26 MED FILL — NYSTATIN 100,000 UNIT/GM CR: 100000 | 7 days supply | Qty: 30 | Fill #0

## 2019-03-26 MED FILL — TRIAMCINOLONE ACETONIDE 0.1: 0.1 | 24 days supply | Qty: 80 | Fill #0

## 2019-03-26 MED FILL — LOSARTAN POTASSIUM 25 MG TA: 25 | 30 days supply | Qty: 30 | Fill #0

## 2019-03-26 NOTE — Progress Notes (Signed)
Assessment & Plan:  Monica Brennan was seen today for hypertension and eczema.  Diagnoses and all orders for this visit:  Essential hypertension -     amLODipine (NORVASC) 10 MG tablet; Take 1 tablet (10 mg total) by mouth daily. -     losartan (COZAAR) 25 MG tablet; Take 1 tablet (25 mg total) by mouth daily for 30 days. -     CBC -     CMP14+EGFR -     Lipid panel Continue all antihypertensives as prescribed.  Remember to bring in your blood pressure log with you for your follow up appointment.  DASH/Mediterranean Diets are healthier choices for HTN.   Candida rash of groin -     nystatin cream (MYCOSTATIN); Apply 1 application topically 2 (two) times daily. To breast area and groin  Eczema, unspecified type -     triamcinolone cream (KENALOG) 0.1 %; Apply 1 application topically 2 (two) times daily.  Morbid obesity with BMI of 40.0-44.9, adult (HCC) -     CMP14+EGFR -     Lipid panel Discussed diet and exercise for person with BMI >40. Instructed: You must burn more calories than you eat. Losing 5 percent of your body weight should be considered a success. In the longer term, losing more than 15 percent of your body weight and staying at this weight is an extremely good result. However, keep in mind that even losing 5 percent of your body weight leads to important health benefits, so try not to get discouraged if you're not able to lose more than this. Will recheck weight in 3-6 months.   Breast cancer screening by mammogram -     MM 3D SCREEN BREAST BILATERAL; Future  Colon cancer screening -     Fecal occult blood, imunochemical    Patient has been counseled on age-appropriate routine health concerns for screening and prevention. These are reviewed and up-to-date. Referrals have been placed accordingly. Immunizations are up-to-date or declined.    Subjective:   Chief Complaint  Patient presents with  . Hypertension    Pt. is here to follow-up on Hypertension.   . Eczema    Pt. stated she think she have eczema with itching at night.    HPI TAYAH IDROVO 43 y.o. female presents to office today for follow up to HTN. She also has complaints of recurrent eczema on her arms and moist area between her groin and right breast.    Essential Hypertension Blood pressure well controlled with amlodipine 65m and losartan 245mdaily. She does not monitor her blood pressure at home. Denies chest pain, shortness of breath, palpitations, lightheadedness, dizziness, headaches or BLE edema. We discussed dietary modifications BP Readings from Last 3 Encounters:  03/26/19 135/81  01/11/19 (!) 154/106  04/10/18 (!) 144/88    Rash Patient complains of rash involving the groin and right breast. She has a history of eczema which has been controlled in the past however she reports recently buying a new fabric softener.  Rash has not changed over time. Discomfort associated with rash: is pruritic.  Associated symptoms: none. Denies: fever. Patient has not had previous evaluation of rash. Patient has not had previous treatment.   Patient has not had contacts with similar rash. Patient has identified precipitant. Patient has had new exposures (see above) She denies any palpable masses or lumps in the breast.   Review of Systems  Constitutional: Negative for fever, malaise/fatigue and weight loss.  HENT: Negative.  Negative for nosebleeds.  Eyes: Negative.  Negative for blurred vision, double vision and photophobia.  Respiratory: Negative.  Negative for cough and shortness of breath.   Cardiovascular: Negative.  Negative for chest pain, palpitations and leg swelling.  Gastrointestinal: Negative.  Negative for heartburn, nausea and vomiting.  Musculoskeletal: Negative.  Negative for myalgias.  Skin: Positive for itching and rash.  Neurological: Negative.  Negative for dizziness, focal weakness, seizures and headaches.  Psychiatric/Behavioral: Negative.  Negative for suicidal ideas.     Past Medical History:  Diagnosis Date  . Hypertension Dx 2006   previously, on lisinopril. stopped lisinopril when medicaid ran out in 2007.     Past Surgical History:  Procedure Laterality Date  . CESAREAN SECTION  2006  . TUBAL LIGATION  2006    Family History  Problem Relation Age of Onset  . Hypertension Mother   . Heart disease Mother   . Diabetes Brother   . Diabetes Maternal Aunt   . Cancer Maternal Aunt        lung   . Diabetes Son   . Thyroid disease Son   . ADD / ADHD Daughter   . ADD / ADHD Son   . Epilepsy Son     Social History Reviewed with no changes to be made today.   Outpatient Medications Prior to Visit  Medication Sig Dispense Refill  . amLODipine (NORVASC) 10 MG tablet Take 1 tablet (10 mg total) by mouth daily. 30 tablet 1  . losartan (COZAAR) 25 MG tablet Take 1 tablet (25 mg total) by mouth daily for 30 days. 30 tablet 1   No facility-administered medications prior to visit.     No Known Allergies     Objective:    BP 135/81 (BP Location: Left Arm, Patient Position: Sitting, Cuff Size: Large)   Pulse 83   Temp 98.6 F (37 C) (Oral)   Ht 5' 3"  (1.6 m)   Wt 238 lb (108 kg)   LMP 03/04/2019   SpO2 98%   BMI 42.16 kg/m  Wt Readings from Last 3 Encounters:  03/26/19 238 lb (108 kg)  04/10/18 229 lb (103.9 kg)  02/28/18 225 lb (102.1 kg)    Physical Exam Vitals signs and nursing note reviewed.  Constitutional:      Appearance: She is well-developed.  HENT:     Head: Normocephalic and atraumatic.  Neck:     Musculoskeletal: Normal range of motion.  Cardiovascular:     Rate and Rhythm: Normal rate and regular rhythm.     Heart sounds: Normal heart sounds. No murmur. No friction rub. No gallop.   Pulmonary:     Effort: Pulmonary effort is normal. No tachypnea or respiratory distress.     Breath sounds: Normal breath sounds. No decreased breath sounds, wheezing, rhonchi or rales.  Chest:     Chest wall: No tenderness.     Abdominal:     General: Bowel sounds are normal.     Palpations: Abdomen is soft.  Musculoskeletal: Normal range of motion.       Arms:  Skin:    General: Skin is warm and dry.     Findings: Rash present. Rash is macular.  Neurological:     Mental Status: She is alert and oriented to person, place, and time.     Coordination: Coordination normal.  Psychiatric:        Behavior: Behavior normal. Behavior is cooperative.        Thought Content: Thought content normal.  Judgment: Judgment normal.          Patient has been counseled extensively about nutrition and exercise as well as the importance of adherence with medications and regular follow-up. The patient was given clear instructions to go to ER or return to medical center if symptoms don't improve, worsen or new problems develop. The patient verbalized understanding.   Follow-up: Return in about 3 months (around 06/25/2019).   Gildardo Pounds, FNP-BC [facility] and [place], [place]   03/26/2019, 2:07 PM

## 2019-03-27 LAB — CMP14+EGFR
ALT: 13 IU/L (ref 0–32)
AST: 12 IU/L (ref 0–40)
Albumin/Globulin Ratio: 1.4 (ref 1.2–2.2)
Albumin: 4.1 g/dL (ref 3.8–4.8)
Alkaline Phosphatase: 78 IU/L (ref 39–117)
BUN/Creatinine Ratio: 9 (ref 9–23)
BUN: 7 mg/dL (ref 6–24)
Bilirubin Total: 0.3 mg/dL (ref 0.0–1.2)
CO2: 23 mmol/L (ref 20–29)
Calcium: 9.3 mg/dL (ref 8.7–10.2)
Chloride: 103 mmol/L (ref 96–106)
Creatinine, Ser: 0.8 mg/dL (ref 0.57–1.00)
GFR calc Af Amer: 103 mL/min/{1.73_m2} (ref >59)
GFR calc non Af Amer: 89 mL/min/{1.73_m2} (ref >59)
Globulin, Total: 3 g/dL (ref 1.5–4.5)
Glucose: 90 mg/dL (ref 65–99)
Potassium: 4.6 mmol/L (ref 3.5–5.2)
Sodium: 139 mmol/L (ref 134–144)
Total Protein: 7.1 g/dL (ref 6.0–8.5)

## 2019-03-27 LAB — CBC
Hematocrit: 36.6 % (ref 34.0–46.6)
Hemoglobin: 12.4 g/dL (ref 11.1–15.9)
MCH: 27.3 pg (ref 26.6–33.0)
MCHC: 33.9 g/dL (ref 31.5–35.7)
MCV: 80 fL (ref 79–97)
Platelets: 362 10*3/uL (ref 150–450)
RBC: 4.55 x10E6/uL (ref 3.77–5.28)
RDW: 16.5 % — ABNORMAL HIGH (ref 11.7–15.4)
WBC: 8.7 10*3/uL (ref 3.4–10.8)

## 2019-03-27 LAB — LIPID PANEL
Chol/HDL Ratio: 3.3 ratio (ref 0.0–4.4)
Cholesterol, Total: 159 mg/dL (ref 100–199)
HDL: 48 mg/dL (ref >39)
LDL Calculated: 100 mg/dL — ABNORMAL HIGH (ref 0–99)
Triglycerides: 55 mg/dL (ref 0–149)
VLDL Cholesterol Cal: 11 mg/dL (ref 5–40)

## 2019-04-04 LAB — FECAL OCCULT BLOOD, IMMUNOCHEMICAL: Fecal Occult Bld: POSITIVE — AB

## 2019-04-07 ENCOUNTER — Other Ambulatory Visit: Payer: Self-pay | Admitting: Nurse Practitioner

## 2019-04-07 DIAGNOSIS — R195 Other fecal abnormalities: Secondary | ICD-10-CM

## 2019-05-21 MED FILL — AMLODIPINE BESYLATE 10 MG T: 10 | 30 days supply | Qty: 30 | Fill #1

## 2019-05-21 MED FILL — NYSTATIN 100,000 UNIT/GM CR: 100000 | 7 days supply | Qty: 30 | Fill #1

## 2019-06-25 ENCOUNTER — Ambulatory Visit: Payer: BLUE CROSS/BLUE SHIELD | Attending: Nurse Practitioner | Admitting: Nurse Practitioner

## 2019-06-25 ENCOUNTER — Other Ambulatory Visit: Payer: Self-pay

## 2019-06-25 ENCOUNTER — Encounter: Payer: Self-pay | Admitting: Nurse Practitioner

## 2019-06-25 DIAGNOSIS — B3789 Other sites of candidiasis: Secondary | ICD-10-CM | POA: Diagnosis not present

## 2019-06-25 DIAGNOSIS — I10 Essential (primary) hypertension: Secondary | ICD-10-CM | POA: Diagnosis not present

## 2019-06-25 MED ORDER — NYSTATIN 100000 UNIT/GM EX POWD: Freq: Three times a day (TID) | CUTANEOUS | 0 refills | Status: AC

## 2019-06-25 MED ORDER — LOSARTAN POTASSIUM 25 MG PO TABS: 25.0000 mg | ORAL_TABLET | Freq: Every day | ORAL | 0 refills | Status: DC

## 2019-06-25 MED ORDER — AMLODIPINE BESYLATE 10 MG PO TABS: 10.0000 mg | ORAL_TABLET | Freq: Every day | ORAL | 0 refills | Status: DC

## 2019-06-25 NOTE — Progress Notes (Signed)
Virtual Visit via Telephone Note Due to national recommendations of social distancing due to [place] 19, telehealth visit is felt to be most appropriate for this patient at this time.  I discussed the limitations, risks, security and privacy concerns of performing an evaluation and management service by telephone and the availability of in person appointments. I also discussed with the patient that there may be a patient responsible charge related to this service. The patient expressed understanding and agreed to proceed.    I connected with Monica Brennan on 06/25/19  at   4:10 PM EDT  EDT by telephone and verified that I am speaking with the correct person using two identifiers.   Consent I discussed the limitations, risks, security and privacy concerns of performing an evaluation and management service by telephone and the availability of in person appointments. I also discussed with the patient that there may be a patient responsible charge related to this service. The patient expressed understanding and agreed to proceed.   Location of Patient: Private Residence   Location of Provider: [place] and [place] participating in Telemedicine visit: [name] FNP-BC [place]    History of Present Illness: Telemedicine visit for: [facility] Hypertension   Essential Hypertension She does not monitor her blood pressure at home. Non adherent with taking amlodipine 10 mg and losartan 25 mg daily. Her prescription has expired last month. She states she has been taking a " a few" of her sister's Losartan. States she was too busy taking care of her son who has diabetes to call for her refills. Denies chest pain, shortness of breath, palpitations, lightheadedness, dizziness, headaches or BLE edema.  BP Readings from Last 3 Encounters:  03/26/19 135/81  01/11/19 (!) 154/106  04/10/18 (!) 144/88     Past Medical History:  Diagnosis Date   . Hypertension Dx 2006   previously, on lisinopril. stopped lisinopril when medicaid ran out in 2007.     Past Surgical History:  Procedure Laterality Date  . CESAREAN SECTION  2006  . TUBAL LIGATION  2006    Family History  Problem Relation Age of Onset  . Hypertension Mother   . Heart disease Mother   . Diabetes Brother   . Diabetes Maternal Aunt   . Cancer Maternal Aunt        lung   . Diabetes Son   . Thyroid disease Son   . ADD / ADHD Daughter   . ADD / ADHD Son   . Epilepsy Son     Social History   Socioeconomic History  . Marital status: Significant Other    Spouse name: Not on file  . Number of children: 3  . Years of education: some colle  . Highest education level: Not on file  Occupational History  . Occupation: TEACHER    Employer: [employer]  Social Needs  . Financial resource strain: Not on file  . Food insecurity    Worry: Not on file    Inability: Not on file  . Transportation needs    Medical: Not on file    Non-medical: Not on file  Tobacco Use  . Smoking status: Never Smoker  . Smokeless tobacco: Never Used  Substance and Sexual Activity  . Alcohol use: No  . Drug use: No  . Sexual activity: Yes    Birth control/protection: None  Lifestyle  . Physical activity    Days per week: Not on file  Minutes per session: Not on file  . Stress: Not on file  Relationships  . Social Herbalist on phone: Not on file    Gets together: Not on file    Attends religious service: Not on file    Active member of club or organization: Not on file    Attends meetings of clubs or organizations: Not on file    Relationship status: Not on file  Other Topics Concern  . Not on file  Social History Narrative   Teaches infants.   Lives with 3 children and fiance.      Observations/Objective: Awake, alert and oriented x 3   Review of Systems  Constitutional: Negative for fever, malaise/fatigue and weight loss.  HENT: Negative.  Negative for  nosebleeds.   Eyes: Negative.  Negative for blurred vision, double vision and photophobia.  Respiratory: Negative.  Negative for cough and shortness of breath.   Cardiovascular: Negative.  Negative for chest pain, palpitations and leg swelling.  Gastrointestinal: Negative.  Negative for heartburn, nausea and vomiting.  Musculoskeletal: Negative.  Negative for myalgias.  Skin: Positive for itching and rash (chronic yeast under breast; continues nystatin. I have also recommended gold bond).  Neurological: Negative.  Negative for dizziness, focal weakness, seizures and headaches.  Psychiatric/Behavioral: Negative.  Negative for suicidal ideas.    Assessment and Plan:  Diagnoses and all orders for this visit:  Essential hypertension -     losartan (COZAAR) 25 MG tablet; Take 1 tablet (25 mg total) by mouth daily. 90 day supply please, transportation issues -     amLODipine (NORVASC) 10 MG tablet; Take 1 tablet (10 mg total) by mouth daily. 90 day supply please, transportation issues Continue all antihypertensives as prescribed.  Remember to bring in your blood pressure log with you for your follow up appointment.  DASH/Mediterranean Diets are healthier choices for HTN.   Candidiasis of breast -     nystatin (MYCOSTATIN/NYSTOP) powder; Apply topically 3 (three) times daily.     Follow Up Instructions Return in about 3 months (around 09/25/2019).     I discussed the assessment and treatment plan with the patient. The patient was provided an opportunity to ask questions and all were answered. The patient agreed with the plan and demonstrated an understanding of the instructions.   The patient was advised to call back or seek an in-person evaluation if the symptoms worsen or if the condition fails to improve as anticipated.  I provided 21 minutes of non-face-to-face time during this encounter including median intraservice time, reviewing previous notes, labs, imaging, medications and  explaining diagnosis and management.  Gildardo Pounds, FNP-BC

## 2019-06-26 MED FILL — AMLODIPINE BESYLATE 10 MG T: 10 | 90 days supply | Qty: 90 | Fill #0

## 2019-06-26 MED FILL — LOSARTAN POTASSIUM 25 MG TA: 25 | 90 days supply | Qty: 90 | Fill #0

## 2019-06-26 MED FILL — NYSTATIN 100000 UNIT/GM POW: 100000 | 30 days supply | Qty: 60 | Fill #0

## 2019-08-06 ENCOUNTER — Encounter: Payer: Self-pay | Admitting: Nurse Practitioner

## 2019-08-06 ENCOUNTER — Other Ambulatory Visit: Payer: Self-pay

## 2019-08-06 ENCOUNTER — Ambulatory Visit: Payer: BLUE CROSS/BLUE SHIELD | Attending: Nurse Practitioner | Admitting: Nurse Practitioner

## 2019-08-06 VITALS — BP 133/85 | HR 79 | Temp 98.8°F | Ht 63.0 in | Wt 238.0 lb

## 2019-08-06 DIAGNOSIS — G8929 Other chronic pain: Secondary | ICD-10-CM

## 2019-08-06 DIAGNOSIS — Z23 Encounter for immunization: Secondary | ICD-10-CM | POA: Diagnosis not present

## 2019-08-06 DIAGNOSIS — M545 Low back pain: Secondary | ICD-10-CM

## 2019-08-06 DIAGNOSIS — I10 Essential (primary) hypertension: Secondary | ICD-10-CM

## 2019-08-06 MED ORDER — CYCLOBENZAPRINE HCL 10 MG PO TABS: 10.0000 mg | ORAL_TABLET | Freq: Three times a day (TID) | ORAL | 1 refills | Status: DC | PRN

## 2019-08-06 MED FILL — CYCLOBENZAPRINE 10 MG TAB: 10 | 20 days supply | Qty: 60 | Fill #0

## 2019-08-06 NOTE — Progress Notes (Signed)
Assessment & Plan:  Monica Brennan was seen today for follow-up.  Diagnoses and all orders for this visit:  Essential hypertension Continue all antihypertensives as prescribed.  Remember to bring in your blood pressure log with you for your follow up appointment.  DASH/Mediterranean Diets are healthier choices for HTN.    Chronic right-sided low back pain without sciatica -     cyclobenzaprine (FLEXERIL) 10 MG tablet; Take 1 tablet (10 mg total) by mouth 3 (three) times daily as needed for muscle spasms. Work on losing weight to help reduce back pain. May alternate with heat and ice application for pain relief. May also alternate with acetaminophen and Ibuprofen as prescribed for back pain. Other alternatives include massage, acupuncture and water aerobics.  You must stay active and avoid a sedentary lifestyle.  I would not prescribed NSAIDs at this time as she has a positive FOBT. She has been referred to GI and states she lost their number. I have placed the number on her AVS today.    Patient has been counseled on age-appropriate routine health concerns for screening and prevention. These are reviewed and up-to-date. Referrals have been placed accordingly. Immunizations are up-to-date or declined.    Subjective:   Chief Complaint  Patient presents with  . Follow-up    Pt. is here for a follow up on HTN. Pt. requesting if PCP can give her medication for back pain.    HPI Monica Brennan 43 y.o. female presents to office today for follow up to HTN.  Essential Hypertension Well controlled today. She is taking losartan 25 mg daily and norvasc 10 mg daily. Denies chest pain, shortness of breath, palpitations, lightheadedness, dizziness, headaches or BLE edema. She is not diet or exercise compliant.  BP Readings from Last 3 Encounters:  08/06/19 133/85  03/26/19 135/81  01/11/19 (!) 154/106     ACUTE BACK PAIN [place] backwards on her butt and back in her bathtub last week. States the  tub was slippery as she had used a new moisturizers with oils in it. There was no loss of consciousness. She has a history of chronic right sided back pain as well. Pain is described as aching and tight. She denies any urinary or bowel incontinence.    Review of Systems  Constitutional: Negative for fever, malaise/fatigue and weight loss.  HENT: Negative.  Negative for nosebleeds.   Eyes: Negative.  Negative for blurred vision, double vision and photophobia.  Respiratory: Negative.  Negative for cough and shortness of breath.   Cardiovascular: Negative.  Negative for chest pain, palpitations and leg swelling.  Gastrointestinal: Negative.  Negative for heartburn, nausea and vomiting.  Musculoskeletal: Positive for back pain and myalgias.  Neurological: Negative.  Negative for dizziness, focal weakness, seizures and headaches.  Psychiatric/Behavioral: Negative.  Negative for suicidal ideas.    Past Medical History:  Diagnosis Date  . Hypertension Dx 2006   previously, on lisinopril. stopped lisinopril when medicaid ran out in 2007.     Past Surgical History:  Procedure Laterality Date  . CESAREAN SECTION  2006  . TUBAL LIGATION  2006    Family History  Problem Relation Age of Onset  . Hypertension Mother   . Heart disease Mother   . Diabetes Brother   . Diabetes Maternal Aunt   . Cancer Maternal Aunt        lung   . Diabetes Son   . Thyroid disease Son   . ADD / ADHD Daughter   . ADD /  ADHD Son   . Epilepsy Son     Social History Reviewed with no changes to be made today.   Outpatient Medications Prior to Visit  Medication Sig Dispense Refill  . amLODipine (NORVASC) 10 MG tablet Take 1 tablet (10 mg total) by mouth daily. 90 day supply please, transportation issues 90 tablet 0  . losartan (COZAAR) 25 MG tablet Take 1 tablet (25 mg total) by mouth daily. 90 day supply please, transportation issues 90 tablet 0  . nystatin cream (MYCOSTATIN) Apply 1 application topically 2  (two) times daily. To breast area and groin 60 g 0  . triamcinolone cream (KENALOG) 0.1 % Apply 1 application topically 2 (two) times daily. 80 g 0   No facility-administered medications prior to visit.     No Known Allergies     Objective:    BP 133/85 (BP Location: Left Arm, Patient Position: Sitting, Cuff Size: Large)   Pulse 79   Temp 98.8 F (37.1 C) (Oral)   Ht 5\' 3"  (1.6 m)   Wt 238 lb (108 kg)   LMP 07/15/2019   SpO2 100%   BMI 42.16 kg/m  Wt Readings from Last 3 Encounters:  08/06/19 238 lb (108 kg)  03/26/19 238 lb (108 kg)  04/10/18 229 lb (103.9 kg)    Physical Exam Vitals signs and nursing note reviewed.  Constitutional:      Appearance: She is well-developed.  HENT:     Head: Normocephalic and atraumatic.  Neck:     Musculoskeletal: Normal range of motion.  Cardiovascular:     Rate and Rhythm: Normal rate and regular rhythm.     Heart sounds: Normal heart sounds. No murmur. No friction rub. No gallop.   Pulmonary:     Effort: Pulmonary effort is normal. No tachypnea or respiratory distress.     Breath sounds: Normal breath sounds. No decreased breath sounds, wheezing, rhonchi or rales.  Chest:     Chest wall: No tenderness.  Abdominal:     General: Bowel sounds are normal.     Palpations: Abdomen is soft.  Musculoskeletal: Normal range of motion.     Lumbar back: She exhibits spasm.  Skin:    General: Skin is warm and dry.  Neurological:     Mental Status: She is alert and oriented to person, place, and time.     Coordination: Coordination normal.  Psychiatric:        Behavior: Behavior normal. Behavior is cooperative.        Thought Content: Thought content normal.        Judgment: Judgment normal.        Patient has been counseled extensively about nutrition and exercise as well as the importance of adherence with medications and regular follow-up. The patient was given clear instructions to go to ER or return to medical center if symptoms  don't improve, worsen or new problems develop. The patient verbalized understanding.   Follow-up: Return in about 3 months (around 11/05/2019).   Gildardo Pounds, FNP-BC [facility] and [place], [place]   08/06/2019, 2:22 PM

## 2019-08-06 NOTE — Patient Instructions (Signed)
[facility].  To schedule an appointment ph. # [phone]     Chronic Back Pain When back pain lasts longer than 3 months, it is called chronic back pain. Pain may get worse at certain times (flare-ups). There are things you can do at home to manage your pain. Follow these instructions at home: Activity      Avoid bending and other activities that make pain worse.  When standing: ? Keep your upper back and neck straight. ? Keep your shoulders pulled back. ? Avoid slouching.  When sitting: ? Keep your back straight. ? Relax your shoulders. Do not round your shoulders or pull them backward.  Do not sit or stand in one place for long periods of time.  Take short rest breaks during the day. Lying down or standing is usually better than sitting. Resting can help relieve pain.  When sitting or lying down for a long time, do some mild activity or stretching. This will help to prevent stiffness and pain.  Get regular exercise. Ask your doctor what activities are safe for you.  Do not lift anything that is heavier than 10 lb (4.5 kg). To prevent injury when you lift things: ? Bend your knees. ? Keep the weight close to your body. ? Avoid twisting. Managing pain  If told, put ice on the painful area. Your doctor may tell you to use ice for 24-48 hours after a flare-up starts. ? Put ice in a plastic bag. ? Place a towel between your skin and the bag. ? Leave the ice on for 20 minutes, 2-3 times a day.  If told, put heat on the painful area as often as told by your doctor. Use the heat source that your doctor recommends, such as a moist heat pack or a heating pad. ? Place a towel between your skin and the heat source. ? Leave the heat on for 20-30 minutes. ? Remove the heat if your skin turns bright red. This is especially important if you are unable to feel pain, heat, or cold. You may have a greater risk of getting burned.  Soak in a warm bath. This can help relieve pain.   Take over-the-counter and prescription medicines only as told by your doctor. General instructions  Sleep on a firm mattress. Try lying on your side with your knees slightly bent. If you lie on your back, put a pillow under your knees.  Keep all follow-up visits as told by your doctor. This is important. Contact a doctor if:  You have pain that does not get better with rest or medicine. Get help right away if:  One or both of your arms or legs feel weak.  One or both of your arms or legs lose feeling (numbness).  You have trouble controlling when you poop (bowel movement) or pee (urinate).  You feel sick to your stomach (nauseous).  You throw up (vomit).  You have belly (abdominal) pain.  You have shortness of breath.  You pass out (faint). Summary  When back pain lasts longer than 3 months, it is called chronic back pain.  Pain may get worse at certain times (flare-ups).  Use ice and heat as told by your doctor. Your doctor may tell you to use ice after flare-ups. This information is not intended to replace advice given to you by your health care provider. Make sure you discuss any questions you have with your health care provider. Document Released: 05/09/2008 Document Revised: 03/14/2019 Document Reviewed:  07/06/2017 Elsevier Patient Education  [place].

## 2019-10-16 ENCOUNTER — Other Ambulatory Visit: Payer: Self-pay | Admitting: Nurse Practitioner

## 2019-10-16 DIAGNOSIS — I10 Essential (primary) hypertension: Secondary | ICD-10-CM

## 2019-10-16 DIAGNOSIS — G8929 Other chronic pain: Secondary | ICD-10-CM

## 2019-10-16 MED FILL — CYCLOBENZAPRINE 10 MG TAB: 10 | 20 days supply | Qty: 60 | Fill #0

## 2019-10-16 MED FILL — AMLODIPINE BESYLATE 10 MG T: 10 | 30 days supply | Qty: 30 | Fill #0

## 2019-10-16 MED FILL — LOSARTAN POTASSIUM 25 MG TA: 25 | 30 days supply | Qty: 30 | Fill #1

## 2019-11-05 ENCOUNTER — Ambulatory Visit: Payer: BLUE CROSS/BLUE SHIELD | Attending: Nurse Practitioner | Admitting: Nurse Practitioner

## 2019-11-05 ENCOUNTER — Other Ambulatory Visit: Payer: Self-pay | Admitting: Nurse Practitioner

## 2019-11-05 ENCOUNTER — Other Ambulatory Visit: Payer: Self-pay

## 2019-11-05 ENCOUNTER — Encounter: Payer: Self-pay | Admitting: Nurse Practitioner

## 2019-11-05 VITALS — BP 125/88 | HR 79 | Temp 99.4°F | Ht 63.0 in | Wt 237.0 lb

## 2019-11-05 DIAGNOSIS — L308 Other specified dermatitis: Secondary | ICD-10-CM

## 2019-11-05 DIAGNOSIS — M545 Low back pain: Secondary | ICD-10-CM | POA: Diagnosis not present

## 2019-11-05 DIAGNOSIS — I10 Essential (primary) hypertension: Secondary | ICD-10-CM

## 2019-11-05 DIAGNOSIS — Z833 Family history of diabetes mellitus: Secondary | ICD-10-CM

## 2019-11-05 DIAGNOSIS — L309 Dermatitis, unspecified: Secondary | ICD-10-CM

## 2019-11-05 DIAGNOSIS — G8929 Other chronic pain: Secondary | ICD-10-CM | POA: Diagnosis not present

## 2019-11-05 DIAGNOSIS — Z1211 Encounter for screening for malignant neoplasm of colon: Secondary | ICD-10-CM

## 2019-11-05 MED ORDER — AMLODIPINE BESYLATE 10 MG PO TABS: ORAL_TABLET | ORAL | 1 refills | Status: DC

## 2019-11-05 MED ORDER — TRIAMCINOLONE ACETONIDE 0.1 % EX CREA: 1.0000 "application " | TOPICAL_CREAM | Freq: Two times a day (BID) | CUTANEOUS | 0 refills | Status: AC

## 2019-11-05 MED ORDER — LOSARTAN POTASSIUM 25 MG PO TABS: 25.0000 mg | ORAL_TABLET | Freq: Every day | ORAL | 0 refills | Status: DC

## 2019-11-05 MED ORDER — CYCLOBENZAPRINE HCL 10 MG PO TABS: ORAL_TABLET | ORAL | 2 refills | Status: DC

## 2019-11-05 MED FILL — AMLODIPINE BESYLATE 10 MG T: 10 | 90 days supply | Qty: 90 | Fill #0

## 2019-11-05 MED FILL — LOSARTAN POTASSIUM 25 MG TA: 25 | 90 days supply | Qty: 90 | Fill #0

## 2019-11-05 MED FILL — TRIAMCINOLONE ACETONIDE 0.1: 0.1 | 80 days supply | Qty: 80 | Fill #0

## 2019-11-05 MED FILL — CYCLOBENZAPRINE 10 MG TAB: 10 | 90 days supply | Qty: 180 | Fill #0

## 2019-11-05 NOTE — Progress Notes (Signed)
Assessment & Plan:  Monica Brennan was seen today for follow-up.  Diagnoses and all orders for this visit:  Essential hypertension -     CBC -     CMP14+EGFR -     Lipid Panel -     losartan (COZAAR) 25 MG tablet; Take 1 tablet (25 mg total) by mouth daily. 90 day supply please, transportation issues -     amLODipine (NORVASC) 10 MG tablet; TAKE 1 TABLET (10 MG TOTAL) BY MOUTH DAILY. Please fill as a 90 day supply  Continue all antihypertensives as prescribed.  Remember to bring in your blood pressure log with you for your follow up appointment.  DASH/Mediterranean Diets are healthier choices for HTN.   Eczema, unspecified type -     triamcinolone cream (KENALOG) 0.1 %; Apply 1 application topically 2 (two) times daily. Please fill as a 90 day supply  Chronic right-sided low back pain without sciatica -     cyclobenzaprine (FLEXERIL) 10 MG tablet; TAKE 1 TABLET BY MOUTH 3 (THREE) TIMES DAILY AS NEEDED FOR MUSCLE SPASMS. Work on losing weight to help reduce back pain. May alternate with heat and ice application for pain relief. May also alternate with acetaminophen and Ibuprofen as prescribed for back pain. Other alternatives include massage, acupuncture and water aerobics.  You must stay active and avoid a sedentary lifestyle.    Family history of diabetes mellitus (DM) -     A1c    Patient has been counseled on age-appropriate routine health concerns for screening and prevention. These are reviewed and up-to-date. Referrals have been placed accordingly. Immunizations are up-to-date or declined.    Subjective:   Chief Complaint  Patient presents with  . Follow-up    Pt. is here for 3 months follow up.    HPI Monica Brennan 43 y.o. female presents to office today for follow up.  has a past medical history of Hypertension (Dx 2006).   She had a positive fit test on March 26, 2019 and was referred to gastroenterology.  Unfortunately her insurance will only pay for the [place] and her transportation is limited.  She would like to be referred to [place] and the breast center once her new insurance is activated and January 2021.  She will let me know through my chart once her insurance has been activated and I will place the referrals at that time.  ESSENTIAL HYPERTENSION She does not monitor her blood pressure at home but endorses medication compliance taking amlodipine 10 mg daily and Losartan 25 mg daily. Denies chest pain, shortness of breath, palpitations, lightheadedness, dizziness, headaches or BLE edema.  BP Readings from Last 3 Encounters:  11/05/19 125/88  08/06/19 133/85  03/26/19 135/81      Review of Systems  Constitutional: Negative for fever, malaise/fatigue and weight loss.  HENT: Negative.  Negative for nosebleeds.   Eyes: Negative.  Negative for blurred vision, double vision and photophobia.  Respiratory: Negative.  Negative for cough and shortness of breath.   Cardiovascular: Negative.  Negative for chest pain, palpitations and leg swelling.  Gastrointestinal: Negative.  Negative for heartburn, nausea and vomiting.  Musculoskeletal: Positive for back pain and myalgias.       Chronic low back pain; stable  Skin: Positive for rash.       Eczema  Neurological: Negative.  Negative for dizziness, focal weakness, seizures and headaches.  Psychiatric/Behavioral: Negative.  Negative for suicidal ideas.    Past Medical History:  Diagnosis  Date  . Hypertension Dx 2006   previously, on lisinopril. stopped lisinopril when medicaid ran out in 2007.     Past Surgical History:  Procedure Laterality Date  . CESAREAN SECTION  2006  . TUBAL LIGATION  2006    Family History  Problem Relation Age of Onset  . Hypertension Mother   . Heart disease Mother   . Diabetes Brother   . Diabetes Maternal Aunt   . Cancer Maternal Aunt        lung   . Diabetes Son   . Thyroid disease Son   . ADD / ADHD Daughter   . ADD / ADHD Son   .  Epilepsy Son     Social History Reviewed with no changes to be made today.   Outpatient Medications Prior to Visit  Medication Sig Dispense Refill  . nystatin cream (MYCOSTATIN) Apply 1 application topically 2 (two) times daily. To breast area and groin 60 g 0  . amLODipine (NORVASC) 10 MG tablet TAKE 1 TABLET (10 MG TOTAL) BY MOUTH DAILY. 30 tablet 1  . cyclobenzaprine (FLEXERIL) 10 MG tablet TAKE 1 TABLET BY MOUTH 3 (THREE) TIMES DAILY AS NEEDED FOR MUSCLE SPASMS. 60 tablet 1  . triamcinolone cream (KENALOG) 0.1 % Apply 1 application topically 2 (two) times daily. 80 g 0  . losartan (COZAAR) 25 MG tablet Take 1 tablet (25 mg total) by mouth daily. 90 day supply please, transportation issues 90 tablet 0   No facility-administered medications prior to visit.     No Known Allergies     Objective:    BP 125/88 (BP Location: Left Arm, Patient Position: Sitting, Cuff Size: Large)   Pulse 79   Temp 99.4 F (37.4 C) (Oral)   Ht 5' 3"  (1.6 m)   Wt 237 lb (107.5 kg)   LMP 10/12/2019   SpO2 99%   BMI 41.98 kg/m  Wt Readings from Last 3 Encounters:  11/05/19 237 lb (107.5 kg)  08/06/19 238 lb (108 kg)  03/26/19 238 lb (108 kg)    Physical Exam Vitals signs and nursing note reviewed.  Constitutional:      Appearance: She is well-developed.  HENT:     Head: Normocephalic and atraumatic.  Neck:     Musculoskeletal: Normal range of motion.  Cardiovascular:     Rate and Rhythm: Normal rate and regular rhythm.     Heart sounds: Normal heart sounds. No murmur. No friction rub. No gallop.   Pulmonary:     Effort: Pulmonary effort is normal. No tachypnea or respiratory distress.     Breath sounds: Normal breath sounds. No decreased breath sounds, wheezing, rhonchi or rales.  Chest:     Chest wall: No tenderness.  Abdominal:     General: Bowel sounds are normal.     Palpations: Abdomen is soft.  Musculoskeletal: Normal range of motion.  Skin:    General: Skin is warm and dry.   Neurological:     Mental Status: She is alert and oriented to person, place, and time.     Coordination: Coordination normal.  Psychiatric:        Behavior: Behavior normal. Behavior is cooperative.        Thought Content: Thought content normal.        Judgment: Judgment normal.          Patient has been counseled extensively about nutrition and exercise as well as the importance of adherence with medications and regular follow-up. The patient was  given clear instructions to go to ER or return to medical center if symptoms don't improve, worsen or new problems develop. The patient verbalized understanding.   Follow-up: Return in about 3 months (around 02/03/2020).   Gildardo Pounds, FNP-BC [facility] and [place], [place]   11/05/2019, 12:21 PM

## 2019-11-06 LAB — CMP14+EGFR
ALT: 18 IU/L (ref 0–32)
AST: 15 IU/L (ref 0–40)
Albumin/Globulin Ratio: 1.3 (ref 1.2–2.2)
Albumin: 4.3 g/dL (ref 3.8–4.8)
Alkaline Phosphatase: 88 IU/L (ref 39–117)
BUN/Creatinine Ratio: 6 — ABNORMAL LOW (ref 9–23)
BUN: 6 mg/dL (ref 6–24)
Bilirubin Total: 0.2 mg/dL (ref 0.0–1.2)
CO2: 25 mmol/L (ref 20–29)
Calcium: 9.7 mg/dL (ref 8.7–10.2)
Chloride: 102 mmol/L (ref 96–106)
Creatinine, Ser: 0.94 mg/dL (ref 0.57–1.00)
GFR calc Af Amer: 85 mL/min/{1.73_m2} (ref >59)
GFR calc non Af Amer: 74 mL/min/{1.73_m2} (ref >59)
Globulin, Total: 3.4 g/dL (ref 1.5–4.5)
Glucose: 94 mg/dL (ref 65–99)
Potassium: 4.7 mmol/L (ref 3.5–5.2)
Sodium: 139 mmol/L (ref 134–144)
Total Protein: 7.7 g/dL (ref 6.0–8.5)

## 2019-11-06 LAB — LIPID PANEL
Chol/HDL Ratio: 4 ratio (ref 0.0–4.4)
Cholesterol, Total: 166 mg/dL (ref 100–199)
HDL: 42 mg/dL (ref >39)
LDL Chol Calc (NIH): 108 mg/dL — ABNORMAL HIGH (ref 0–99)
Triglycerides: 87 mg/dL (ref 0–149)
VLDL Cholesterol Cal: 16 mg/dL (ref 5–40)

## 2019-11-06 LAB — CBC
Hematocrit: 37.8 % (ref 34.0–46.6)
Hemoglobin: 12.7 g/dL (ref 11.1–15.9)
MCH: 26 pg — ABNORMAL LOW (ref 26.6–33.0)
MCHC: 33.6 g/dL (ref 31.5–35.7)
MCV: 78 fL — ABNORMAL LOW (ref 79–97)
Platelets: 433 10*3/uL (ref 150–450)
RBC: 4.88 x10E6/uL (ref 3.77–5.28)
RDW: 16.1 % — ABNORMAL HIGH (ref 11.7–15.4)
WBC: 9.3 10*3/uL (ref 3.4–10.8)

## 2019-11-13 ENCOUNTER — Encounter: Payer: Self-pay | Admitting: Nurse Practitioner

## 2019-12-26 ENCOUNTER — Ambulatory Visit: Payer: Medicaid Other | Attending: Internal Medicine

## 2019-12-26 DIAGNOSIS — Z20822 Contact with and (suspected) exposure to covid-19: Secondary | ICD-10-CM

## 2019-12-27 LAB — NOVEL CORONAVIRUS, NAA: SARS-CoV-2, NAA: NOT DETECTED

## 2020-01-20 ENCOUNTER — Ambulatory Visit: Payer: Medicaid Other | Attending: Internal Medicine

## 2020-01-20 DIAGNOSIS — Z20822 Contact with and (suspected) exposure to covid-19: Secondary | ICD-10-CM

## 2020-01-21 ENCOUNTER — Encounter: Payer: Self-pay | Admitting: Physician Assistant

## 2020-01-21 LAB — NOVEL CORONAVIRUS, NAA: SARS-CoV-2, NAA: DETECTED — AB

## 2020-01-22 ENCOUNTER — Other Ambulatory Visit: Payer: Self-pay | Admitting: Physician Assistant

## 2020-01-22 DIAGNOSIS — U071 COVID-19: Secondary | ICD-10-CM

## 2020-01-22 DIAGNOSIS — I10 Essential (primary) hypertension: Secondary | ICD-10-CM

## 2020-01-22 NOTE — Progress Notes (Signed)
  I connected by phone with Monica Brennan on 01/22/2020 at 7:42 AM to discuss the potential use of an new treatment for mild to moderate COVID-19 viral infection in non-hospitalized patients.  This patient is a 43 y.o. female that meets the FDA criteria for Emergency Use Authorization of bamlanivimab or casirivimab\imdevimab.  Has a (+) direct SARS-CoV-2 viral test result  Has mild or moderate COVID-19   Is ? 46 years of age and weighs ? 40 kg  Is NOT hospitalized due to COVID-19  Is NOT requiring oxygen therapy or requiring an increase in baseline oxygen flow rate due to COVID-19  Is within 10 days of symptom onset  Has at least one of the high risk factor(s) for progression to severe COVID-19 and/or hospitalization as defined in EUA.  Specific high risk criteria : BMI >/= 35   I have spoken and communicated the following to the patient or parent/caregiver:  1. FDA has authorized the emergency use of bamlanivimab and casirivimab\imdevimab for the treatment of mild to moderate COVID-19 in adults and pediatric patients with positive results of direct SARS-CoV-2 viral testing who are 26 years of age and older weighing at least 40 kg, and who are at high risk for progressing to severe COVID-19 and/or hospitalization.  2. The significant known and potential risks and benefits of bamlanivimab and casirivimab\imdevimab, and the extent to which such potential risks and benefits are unknown.  3. Information on available alternative treatments and the risks and benefits of those alternatives, including clinical trials.  4. Patients treated with bamlanivimab and casirivimab\imdevimab should continue to self-isolate and use infection control measures (e.g., wear mask, isolate, social distance, avoid sharing personal items, clean and disinfect "high touch" surfaces, and frequent handwashing) according to CDC guidelines.   5. The patient or parent/caregiver has the option to accept or refuse  bamlanivimab or casirivimab\imdevimab .  After reviewing this information with the patient, The patient agreed to proceed with receiving the bamlanimivab infusion and will be provided a copy of the Fact sheet prior to receiving the infusion.   Pt is set up for infusion 01/23/20 at 10:30am. She will require transportation. Sx onset 2/13.   Angelena Form 01/22/2020 7:42 AM

## 2020-01-23 ENCOUNTER — Ambulatory Visit (HOSPITAL_COMMUNITY): Payer: Medicaid Other

## 2020-01-24 ENCOUNTER — Encounter (HOSPITAL_COMMUNITY): Payer: Self-pay

## 2020-01-24 ENCOUNTER — Ambulatory Visit (HOSPITAL_COMMUNITY)
Admission: RE | Admit: 2020-01-24 | Discharge: 2020-01-24 | Disposition: A | Discharge status: Home / Self Care | Payer: 59 | Source: Ambulatory Visit | Attending: Pulmonary Disease | Admitting: Pulmonary Disease

## 2020-01-24 DIAGNOSIS — I10 Essential (primary) hypertension: Secondary | ICD-10-CM | POA: Insufficient documentation

## 2020-01-24 DIAGNOSIS — U071 COVID-19: Secondary | ICD-10-CM | POA: Insufficient documentation

## 2020-01-24 MED ORDER — EPINEPHRINE 0.3 MG/0.3ML IJ SOAJ: 0.3000 mg | Freq: Once | INTRAMUSCULAR | Status: DC | PRN

## 2020-01-24 MED ORDER — ALBUTEROL SULFATE HFA 108 (90 BASE) MCG/ACT IN AERS: 2.0000 | INHALATION_SPRAY | Freq: Once | RESPIRATORY_TRACT | Status: DC | PRN

## 2020-01-24 MED ORDER — METHYLPREDNISOLONE SODIUM SUCC 125 MG IJ SOLR: 125.0000 mg | Freq: Once | INTRAMUSCULAR | Status: DC | PRN

## 2020-01-24 MED ORDER — SODIUM CHLORIDE 0.9 % IV SOLN
INTRAVENOUS | Status: DC | PRN
  Administered 2020-01-24: 13:00:00 250 mL via INTRAVENOUS

## 2020-01-24 MED ORDER — DIPHENHYDRAMINE HCL 50 MG/ML IJ SOLN: 50.0000 mg | Freq: Once | INTRAMUSCULAR | Status: DC | PRN

## 2020-01-24 MED ORDER — SODIUM CHLORIDE 0.9 % IV SOLN
700.0000 mg | Freq: Once | INTRAVENOUS | Status: AC
  Administered 2020-01-24: 13:00:00 700 mg via INTRAVENOUS
  Filled 2020-01-24: qty 20

## 2020-01-24 MED ORDER — FAMOTIDINE IN NACL 20-0.9 MG/50ML-% IV SOLN: 20.0000 mg | Freq: Once | INTRAVENOUS | Status: DC | PRN

## 2020-01-24 NOTE — Progress Notes (Signed)
  Diagnosis: COVID-19  Physician:Dr Joya Gaskins Procedure: [facility] Med: bamlanivimab infusion - Provided patient with bamlanimivab fact sheet for patients, parents and caregivers prior to infusion.  Complications: No immediate complications noted.  Discharge: Discharged home   Virgilio Belling 01/24/2020

## 2020-01-24 NOTE — Discharge Instructions (Signed)

## 2020-01-29 MED FILL — LOSARTAN POTASSIUM 25 MG TA: 25 | 30 days supply | Qty: 30 | Fill #1

## 2020-01-29 MED FILL — AMLODIPINE BESYLATE 10 MG T: 10 | 90 days supply | Qty: 90 | Fill #1

## 2020-01-29 MED FILL — TRIAMCINOLONE ACETONIDE 0.1: 0.1 | 30 days supply | Qty: 80 | Fill #1

## 2020-02-03 ENCOUNTER — Encounter: Payer: Self-pay | Admitting: Nurse Practitioner

## 2020-02-03 ENCOUNTER — Ambulatory Visit: Payer: 59 | Attending: Nurse Practitioner | Admitting: Nurse Practitioner

## 2020-02-03 ENCOUNTER — Other Ambulatory Visit: Payer: Self-pay

## 2020-02-03 DIAGNOSIS — M791 Myalgia, unspecified site: Secondary | ICD-10-CM

## 2020-02-03 DIAGNOSIS — Z8616 Personal history of COVID-19: Secondary | ICD-10-CM

## 2020-02-03 DIAGNOSIS — R0602 Shortness of breath: Secondary | ICD-10-CM | POA: Diagnosis not present

## 2020-02-03 DIAGNOSIS — I10 Essential (primary) hypertension: Secondary | ICD-10-CM

## 2020-02-03 DIAGNOSIS — B3789 Other sites of candidiasis: Secondary | ICD-10-CM | POA: Diagnosis not present

## 2020-02-03 DIAGNOSIS — M545 Low back pain: Secondary | ICD-10-CM

## 2020-02-03 DIAGNOSIS — B372 Candidiasis of skin and nail: Secondary | ICD-10-CM | POA: Insufficient documentation

## 2020-02-03 DIAGNOSIS — Z79899 Other long term (current) drug therapy: Secondary | ICD-10-CM | POA: Insufficient documentation

## 2020-02-03 DIAGNOSIS — U071 COVID-19: Secondary | ICD-10-CM

## 2020-02-03 DIAGNOSIS — R5383 Other fatigue: Secondary | ICD-10-CM | POA: Insufficient documentation

## 2020-02-03 DIAGNOSIS — Z1211 Encounter for screening for malignant neoplasm of colon: Secondary | ICD-10-CM

## 2020-02-03 DIAGNOSIS — G8929 Other chronic pain: Secondary | ICD-10-CM

## 2020-02-03 MED ORDER — CYCLOBENZAPRINE HCL 10 MG PO TABS: ORAL_TABLET | ORAL | 2 refills | Status: DC

## 2020-02-03 MED ORDER — NYSTATIN 100000 UNIT/GM EX CREA: 1.0000 "application " | TOPICAL_CREAM | Freq: Two times a day (BID) | CUTANEOUS | 1 refills | Status: DC

## 2020-02-03 MED FILL — NYSTATIN 100,000 UNIT/GM CR: 100000 | 15 days supply | Qty: 60 | Fill #0

## 2020-02-03 MED FILL — CYCLOBENZAPRINE 10 MG TAB: 10 | 20 days supply | Qty: 60 | Fill #0

## 2020-02-03 NOTE — Progress Notes (Signed)
Virtual Visit via Telephone Note Due to national recommendations of social distancing due to [place] 19, telehealth visit is felt to be most appropriate for this patient at this time.  I discussed the limitations, risks, security and privacy concerns of performing an evaluation and management service by telephone and the availability of in person appointments. I also discussed with the patient that there may be a patient responsible charge related to this service. The patient expressed understanding and agreed to proceed.    I connected with Monica Brennan on 02/03/20  at   2:10 PM EST  EDT by telephone and verified that I am speaking with the correct person using two identifiers.   Consent I discussed the limitations, risks, security and privacy concerns of performing an evaluation and management service by telephone and the availability of in person appointments. I also discussed with the patient that there may be a patient responsible charge related to this service. The patient expressed understanding and agreed to proceed.   Location of Patient: Private  Residence    Location of Provider: [place] and [place] participating in Telemedicine visit: [name] FNP-BC [place]    History of Present Illness: Telemedicine visit for: Follow up  has a past medical history of Hypertension (Dx 2006) and Morbid obesity ([place]).   Diagnosed with COVID February 16th. She received bamlanivimab  Treatment on 01-24-2020.  Currently endorses fatigue, shortness of breath with exertion and myalgias.  Denies chest pain, headaches, fever or cough  Essential Hypertension Has not been monitoring her blood pressure at home recently due to symptoms she has been experiencing. Current medications: amlodipine 10 mg daily and losartan 25 mg daily.  BP Readings from Last 3 Encounters:  01/24/20 (!) 134/93  11/05/19 125/88  08/06/19 133/85     Past  Medical History:  Diagnosis Date  . Hypertension Dx 2006   previously, on lisinopril. stopped lisinopril when medicaid ran out in 2007.   . Morbid obesity ([place])     Past Surgical History:  Procedure Laterality Date  . CESAREAN SECTION  2006  . TUBAL LIGATION  2006    Family History  Problem Relation Age of Onset  . Hypertension Mother   . Heart disease Mother   . Diabetes Brother   . Diabetes Maternal Aunt   . Cancer Maternal Aunt        lung   . Diabetes Son   . Thyroid disease Son   . ADD / ADHD Daughter   . ADD / ADHD Son   . Epilepsy Son     Social History   Socioeconomic History  . Marital status: Significant Other    Spouse name: Not on file  . Number of children: 3  . Years of education: some colle  . Highest education level: Not on file  Occupational History  . Occupation: TEACHER    Employer: [employer]  Tobacco Use  . Smoking status: Never Smoker  . Smokeless tobacco: Never Used  Substance and Sexual Activity  . Alcohol use: No  . Drug use: No  . Sexual activity: Yes    Birth control/protection: None  Other Topics Concern  . Not on file  Social History Narrative   Teaches infants.   Lives with 3 children and fiance.    Social Determinants of Health   Financial Resource Strain:   . Difficulty of Paying Living Expenses:   Food Insecurity:   . Worried About  Running Out of Food in the Last Year:   . [place] in the Last Year:   Transportation Needs:   . Lack of Transportation (Medical):   Marland Kitchen Lack of Transportation (Non-Medical):   Physical Activity:   . Days of Exercise per Week:   . Minutes of Exercise per Session:   Stress:   . Feeling of Stress :   Social Connections:   . Frequency of Communication with Friends and Family:   . Frequency of Social Gatherings with Friends and Family:   . Attends Religious Services:   . Active Member of Clubs or Organizations:   . Attends Archivist Meetings:   Marland Kitchen Marital Status:       Observations/Objective: Awake, alert and oriented x 3   Review of Systems  Constitutional: Positive for malaise/fatigue. Negative for fever and weight loss.  HENT: Negative.  Negative for nosebleeds.   Eyes: Negative.  Negative for blurred vision, double vision and photophobia.  Respiratory: Positive for shortness of breath. Negative for cough and wheezing.   Cardiovascular: Negative.  Negative for chest pain, palpitations and leg swelling.  Gastrointestinal: Negative.  Negative for heartburn, nausea and vomiting.  Musculoskeletal: Positive for back pain and myalgias.  Skin: Positive for rash (chronic groin).  Neurological: Negative.  Negative for dizziness, focal weakness, seizures and headaches.  Psychiatric/Behavioral: Negative.  Negative for suicidal ideas.    Assessment and Plan: Monica Brennan was seen today for follow-up.  Diagnoses and all orders for this visit:  COVID-19 Symptom management  Chronic right-sided low back pain without sciatica -     cyclobenzaprine (FLEXERIL) 10 MG tablet; TAKE 1 TABLET BY MOUTH 3 (THREE) TIMES DAILY AS NEEDED FOR MUSCLE SPASMS. Work on losing weight to help reduce back pain. May alternate with heat and ice application for pain relief. May also alternate with acetaminophen and Ibuprofen as prescribed for back pain. Other alternatives include massage, acupuncture and water aerobics.  You must stay active and avoid a sedentary lifestyle.    Candida rash of groin -     nystatin cream (MYCOSTATIN); Apply 1 application topically 2 (two) times daily. To breast area and groin  Colon cancer screening -     Ambulatory referral to Gastroenterology     Follow Up Instructions Return in about 3 months (around 05/05/2020).     I discussed the assessment and treatment plan with the patient. The patient was provided an opportunity to ask questions and all were answered. The patient agreed with the plan and demonstrated an understanding of the  instructions.   The patient was advised to call back or seek an in-person evaluation if the symptoms worsen or if the condition fails to improve as anticipated.  I provided 17 minutes of non-face-to-face time during this encounter including median intraservice time, reviewing previous notes, labs, imaging, medications and explaining diagnosis and management.  Gildardo Pounds, FNP-BC

## 2020-02-06 ENCOUNTER — Other Ambulatory Visit: Payer: Self-pay

## 2020-02-06 ENCOUNTER — Ambulatory Visit
Admission: RE | Admit: 2020-02-06 | Discharge: 2020-02-06 | Disposition: A | Discharge status: Home / Self Care | Payer: Medicaid Other | Source: Ambulatory Visit | Attending: Nurse Practitioner | Admitting: Nurse Practitioner

## 2020-02-06 DIAGNOSIS — Z1231 Encounter for screening mammogram for malignant neoplasm of breast: Secondary | ICD-10-CM

## 2020-02-07 ENCOUNTER — Ambulatory Visit: Payer: Medicaid Other

## 2020-02-20 ENCOUNTER — Other Ambulatory Visit: Payer: Medicaid Other

## 2020-02-27 ENCOUNTER — Ambulatory Visit: Payer: 59 | Attending: Nurse Practitioner

## 2020-02-27 ENCOUNTER — Other Ambulatory Visit: Payer: Self-pay

## 2020-02-27 DIAGNOSIS — Z131 Encounter for screening for diabetes mellitus: Secondary | ICD-10-CM

## 2020-02-27 DIAGNOSIS — I10 Essential (primary) hypertension: Secondary | ICD-10-CM

## 2020-02-28 ENCOUNTER — Encounter: Payer: Self-pay | Admitting: Nurse Practitioner

## 2020-02-28 LAB — CMP14+EGFR
ALT: 13 IU/L (ref 0–32)
AST: 16 IU/L (ref 0–40)
Albumin/Globulin Ratio: 1.4 (ref 1.2–2.2)
Albumin: 4.5 g/dL (ref 3.8–4.8)
Alkaline Phosphatase: 90 IU/L (ref 39–117)
BUN/Creatinine Ratio: 8 — ABNORMAL LOW (ref 9–23)
BUN: 7 mg/dL (ref 6–24)
Bilirubin Total: 0.3 mg/dL (ref 0.0–1.2)
CO2: 22 mmol/L (ref 20–29)
Calcium: 9.5 mg/dL (ref 8.7–10.2)
Chloride: 103 mmol/L (ref 96–106)
Creatinine, Ser: 0.93 mg/dL (ref 0.57–1.00)
GFR calc Af Amer: 85 mL/min/{1.73_m2} (ref >59)
GFR calc non Af Amer: 74 mL/min/{1.73_m2} (ref >59)
Globulin, Total: 3.2 g/dL (ref 1.5–4.5)
Glucose: 93 mg/dL (ref 65–99)
Potassium: 4.8 mmol/L (ref 3.5–5.2)
Sodium: 139 mmol/L (ref 134–144)
Total Protein: 7.7 g/dL (ref 6.0–8.5)

## 2020-02-28 LAB — HEMOGLOBIN A1C
Est. average glucose Bld gHb Est-mCnc: 117 mg/dL
Hgb A1c MFr Bld: 5.7 % — ABNORMAL HIGH (ref 4.8–5.6)

## 2020-02-28 LAB — CBC
Hematocrit: 36.2 % (ref 34.0–46.6)
Hemoglobin: 12.5 g/dL (ref 11.1–15.9)
MCH: 26.1 pg — ABNORMAL LOW (ref 26.6–33.0)
MCHC: 34.5 g/dL (ref 31.5–35.7)
MCV: 76 fL — ABNORMAL LOW (ref 79–97)
Platelets: 501 10*3/uL — ABNORMAL HIGH (ref 150–450)
RBC: 4.79 x10E6/uL (ref 3.77–5.28)
RDW: 16.8 % — ABNORMAL HIGH (ref 11.7–15.4)
WBC: 9.5 10*3/uL (ref 3.4–10.8)

## 2020-03-08 ENCOUNTER — Encounter: Payer: Self-pay | Admitting: Nurse Practitioner

## 2020-06-17 ENCOUNTER — Other Ambulatory Visit: Payer: Self-pay

## 2020-06-17 ENCOUNTER — Encounter: Payer: Self-pay | Admitting: Nurse Practitioner

## 2020-06-17 ENCOUNTER — Other Ambulatory Visit (HOSPITAL_COMMUNITY)
Admission: RE | Admit: 2020-06-17 | Discharge: 2020-06-17 | Disposition: A | Discharge status: Home / Self Care | Payer: 59 | Source: Ambulatory Visit | Attending: Nurse Practitioner | Admitting: Nurse Practitioner

## 2020-06-17 ENCOUNTER — Ambulatory Visit (HOSPITAL_BASED_OUTPATIENT_CLINIC_OR_DEPARTMENT_OTHER): Payer: 59 | Admitting: Nurse Practitioner

## 2020-06-17 ENCOUNTER — Other Ambulatory Visit: Payer: Self-pay | Admitting: Nurse Practitioner

## 2020-06-17 VITALS — BP 138/86 | HR 70 | Temp 97.7°F | Ht 63.0 in | Wt 231.0 lb

## 2020-06-17 DIAGNOSIS — R7303 Prediabetes: Secondary | ICD-10-CM

## 2020-06-17 DIAGNOSIS — R002 Palpitations: Secondary | ICD-10-CM

## 2020-06-17 DIAGNOSIS — Z1159 Encounter for screening for other viral diseases: Secondary | ICD-10-CM

## 2020-06-17 DIAGNOSIS — N760 Acute vaginitis: Secondary | ICD-10-CM | POA: Insufficient documentation

## 2020-06-17 DIAGNOSIS — I10 Essential (primary) hypertension: Secondary | ICD-10-CM | POA: Diagnosis not present

## 2020-06-17 DIAGNOSIS — L2089 Other atopic dermatitis: Secondary | ICD-10-CM

## 2020-06-17 LAB — GLUCOSE, POCT (MANUAL RESULT ENTRY): POC Glucose: 112 mg/dl — AB (ref 70–99)

## 2020-06-17 MED ORDER — AMLODIPINE BESYLATE 10 MG PO TABS: ORAL_TABLET | ORAL | 1 refills | Status: DC

## 2020-06-17 MED ORDER — LOSARTAN POTASSIUM 25 MG PO TABS: 25.0000 mg | ORAL_TABLET | Freq: Every day | ORAL | 0 refills | Status: DC

## 2020-06-17 MED ORDER — TRIAMCINOLONE ACETONIDE 0.025 % EX OINT: 1.0000 "application " | TOPICAL_OINTMENT | Freq: Two times a day (BID) | CUTANEOUS | 1 refills | Status: DC

## 2020-06-17 MED FILL — AMLODIPINE BESYLATE 10 MG T: 10 | 90 days supply | Qty: 90 | Fill #0

## 2020-06-17 MED FILL — LOSARTAN POTASSIUM 25 MG TA: 25 | 90 days supply | Qty: 90 | Fill #0

## 2020-06-17 MED FILL — TRIAMCINOLONE 0.025% OINT: 0.025 | 30 days supply | Qty: 60 | Fill #0

## 2020-06-17 NOTE — Progress Notes (Signed)
Assessment & Plan:  Monica Brennan was seen today for medication refill.  Diagnoses and all orders for this visit:  Essential hypertension -     losartan (COZAAR) 25 MG tablet; Take 1 tablet (25 mg total) by mouth daily. 90 day supply please, transportation issues -     amLODipine (NORVASC) 10 MG tablet; TAKE 1 TABLET (10 MG TOTAL) BY MOUTH DAILY. Please fill as a 90 day supply -     Basic metabolic panel Continue all antihypertensives as prescribed.  Remember to bring in your blood pressure log with you for your follow up appointment.  DASH/Mediterranean Diets are healthier choices for HTN.    Prediabetes -     Glucose (CBG) -     Hemoglobin A1c Controlled Continue medications as prescribed.  Continue blood sugar control: low carbohydrate diet, and regular physical exercise as tolerated, 150 minutes per week (30 min each day, 5 days per week, or 50 min 3 days per week).  Acute vaginitis -     Cervicovaginal ancillary only  Palpitation -     TSH  Flexural atopic dermatitis -     triamcinolone (KENALOG) 0.025 % ointment; Apply 1 application topically 2 (two) times daily.  Need for hepatitis C screening test -     Hepatitis C Antibody  Obesity, morbid, BMI 40.0-49.9 (HCC) -     TSH Discussed diet and exercise for person with BMI >40. Instructed: You must burn more calories than you eat. Losing 5 percent of your body weight should be considered a success. In the longer term, losing more than 15 percent of your body weight and staying at this weight is an extremely good result. However, keep in mind that even losing 5 percent of your body weight leads to important health benefits, so try not to get discouraged if you're not able to lose more than this. Will recheck weight in 3-6 months.   Patient has been counseled on age-appropriate routine health concerns for screening and prevention. These are reviewed and up-to-date. Referrals have been placed accordingly. Immunizations are  up-to-date or declined.    Subjective:   Chief Complaint  Patient presents with  . Medication Refill    Pt. is here for medication refill. Pt. stated she has been having itching sensation in her vaginal.    HPI Monica Brennan 44 y.o. female presents to office today for follow up.  has a past medical history of Hypertension (Dx 2006), Morbid obesity ([place]), and Prediabetes.  Notes heart fluttering/palpitations that occurred for 1 month after having Covid back in March of this year.  Symptoms have currently resolved.  Also with concerns of vaginitis symptoms itching.  Onset several days ago.  There are no other associated symptoms.   Requesting steroid cream refill of triamcinolone for atopic dermatitis.   Essential Hypertension Well controlled. She endorses medication adherence taking amlodipine 10 mg daily and losartan 25 mg daily. Denies chest pain, shortness of breath, palpitations, lightheadedness, dizziness, headaches or BLE edema.  BP Readings from Last 3 Encounters:  06/17/20 138/86  01/24/20 (!) 134/93  11/05/19 125/88   Prediabetes She is not currently taking any glucose lowering medications. Needs to work on losing weight and dietary modifications.  Lab Results  Component Value Date   HGBA1C 5.7 (H) 02/27/2020   Review of Systems  Constitutional: Negative for fever, malaise/fatigue and weight loss.  HENT: Negative.  Negative for nosebleeds.   Eyes: Negative.  Negative for blurred vision, double vision and photophobia.  Respiratory:  Negative.  Negative for cough and shortness of breath.   Cardiovascular: Negative.  Negative for chest pain, palpitations and leg swelling.  Gastrointestinal: Negative.  Negative for heartburn, nausea and vomiting.  Genitourinary:       See HPI  Musculoskeletal: Negative.  Negative for myalgias.  Skin: Positive for itching and rash.  Neurological: Negative.  Negative for dizziness, focal weakness, seizures and headaches.    Psychiatric/Behavioral: Negative.  Negative for suicidal ideas.    Past Medical History:  Diagnosis Date  . Hypertension Dx 2006   previously, on lisinopril. stopped lisinopril when medicaid ran out in 2007.   . Morbid obesity ([place])   . Prediabetes     Past Surgical History:  Procedure Laterality Date  . CESAREAN SECTION  2006  . TUBAL LIGATION  2006    Family History  Problem Relation Age of Onset  . Hypertension Mother   . Heart disease Mother   . Diabetes Brother   . Diabetes Maternal Aunt   . Cancer Maternal Aunt        lung   . Diabetes Son   . Thyroid disease Son   . ADD / ADHD Daughter   . ADD / ADHD Son   . Epilepsy Son     Social History Reviewed with no changes to be made today.   Outpatient Medications Prior to Visit  Medication Sig Dispense Refill  . cyclobenzaprine (FLEXERIL) 10 MG tablet TAKE 1 TABLET BY MOUTH 3 (THREE) TIMES DAILY AS NEEDED FOR MUSCLE SPASMS. 60 tablet 2  . nystatin cream (MYCOSTATIN) Apply 1 application topically 2 (two) times daily. To breast area and groin 60 g 1  . amLODipine (NORVASC) 10 MG tablet TAKE 1 TABLET (10 MG TOTAL) BY MOUTH DAILY. Please fill as a 90 day supply 90 tablet 1  . losartan (COZAAR) 25 MG tablet Take 1 tablet (25 mg total) by mouth daily. 90 day supply please, transportation issues 90 tablet 0   No facility-administered medications prior to visit.    No Known Allergies     Objective:    BP 138/86 (BP Location: Left Arm, Patient Position: Sitting, Cuff Size: Normal)   Pulse 70   Temp 97.7 F (36.5 C) (Temporal)   Ht 5\' 3"  (1.6 m)   Wt 231 lb (104.8 kg)   SpO2 90%   BMI 40.92 kg/m  Wt Readings from Last 3 Encounters:  06/17/20 231 lb (104.8 kg)  11/05/19 237 lb (107.5 kg)  08/06/19 238 lb (108 kg)    Physical Exam Vitals and nursing note reviewed.  Constitutional:      Appearance: She is well-developed.  HENT:     Head: Normocephalic and atraumatic.  Cardiovascular:     Rate and Rhythm:  Normal rate and regular rhythm.     Heart sounds: Normal heart sounds. No murmur heard.  No friction rub. No gallop.   Pulmonary:     Effort: Pulmonary effort is normal. No tachypnea or respiratory distress.     Breath sounds: Normal breath sounds. No decreased breath sounds, wheezing, rhonchi or rales.  Chest:     Chest wall: No tenderness.  Abdominal:     General: Bowel sounds are normal.     Palpations: Abdomen is soft.  Musculoskeletal:        General: Normal range of motion.     Cervical back: Normal range of motion.  Skin:    General: Skin is warm and dry.     Findings: Rash present. Rash  is macular.     Comments: Rough, dry, patchy areas of macular rash near the left antecubital space, elbow and forearm.  Neurological:     Mental Status: She is alert and oriented to person, place, and time.     Coordination: Coordination normal.  Psychiatric:        Behavior: Behavior normal. Behavior is cooperative.        Thought Content: Thought content normal.        Judgment: Judgment normal.          Patient has been counseled extensively about nutrition and exercise as well as the importance of adherence with medications and regular follow-up. The patient was given clear instructions to go to ER or return to medical center if symptoms don't improve, worsen or new problems develop. The patient verbalized understanding.   Follow-up: Return in about 3 months (around 09/17/2020).   Gildardo Pounds, FNP-BC [facility] and [place], [place]   06/17/2020, 10:46 AM

## 2020-06-18 LAB — BASIC METABOLIC PANEL
BUN/Creatinine Ratio: 12 (ref 9–23)
BUN: 10 mg/dL (ref 6–24)
CO2: 24 mmol/L (ref 20–29)
Calcium: 9.2 mg/dL (ref 8.7–10.2)
Chloride: 104 mmol/L (ref 96–106)
Creatinine, Ser: 0.86 mg/dL (ref 0.57–1.00)
GFR calc Af Amer: 94 mL/min/{1.73_m2} (ref >59)
GFR calc non Af Amer: 81 mL/min/{1.73_m2} (ref >59)
Glucose: 94 mg/dL (ref 65–99)
Potassium: 4.7 mmol/L (ref 3.5–5.2)
Sodium: 138 mmol/L (ref 134–144)

## 2020-06-18 LAB — CERVICOVAGINAL ANCILLARY ONLY
Bacterial Vaginitis (gardnerella): POSITIVE — AB
Candida Glabrata: NEGATIVE
Candida Vaginitis: NEGATIVE
Chlamydia: NEGATIVE
Comment: NEGATIVE
Comment: NEGATIVE
Comment: NEGATIVE
Comment: NEGATIVE
Comment: NEGATIVE
Comment: NORMAL
Neisseria Gonorrhea: NEGATIVE
Trichomonas: NEGATIVE

## 2020-06-18 LAB — TSH: TSH: 1.75 u[IU]/mL (ref 0.450–4.500)

## 2020-06-18 LAB — HEMOGLOBIN A1C
Est. average glucose Bld gHb Est-mCnc: 117 mg/dL
Hgb A1c MFr Bld: 5.7 % — ABNORMAL HIGH (ref 4.8–5.6)

## 2020-06-18 LAB — HEPATITIS C ANTIBODY: Hep C Virus Ab: <0.1 s/co ratio (ref 0.0–0.9)

## 2020-06-20 ENCOUNTER — Other Ambulatory Visit: Payer: Self-pay | Admitting: Nurse Practitioner

## 2020-06-20 MED ORDER — METRONIDAZOLE 500 MG PO TABS: 500.0000 mg | ORAL_TABLET | Freq: Two times a day (BID) | ORAL | 0 refills | Status: AC

## 2020-06-22 MED FILL — metroNIDAZOLE 500 MG TABS: 500 | 7 days supply | Qty: 14 | Fill #0

## 2020-10-09 ENCOUNTER — Other Ambulatory Visit: Payer: Self-pay | Admitting: Nurse Practitioner

## 2020-10-09 DIAGNOSIS — I10 Essential (primary) hypertension: Secondary | ICD-10-CM

## 2020-10-09 MED FILL — AMLODIPINE BESYLATE 10 MG T: 10 | 90 days supply | Qty: 90 | Fill #1

## 2020-10-09 MED FILL — CYCLOBENZAPRINE 10 MG TAB: 10 | 20 days supply | Qty: 60 | Fill #1

## 2020-10-09 NOTE — Telephone Encounter (Signed)
Requested Prescriptions  Pending Prescriptions Disp Refills   losartan (COZAAR) 25 MG tablet [Pharmacy Med Name: LOSARTAN POTASSIUM 25 MG TA 25 Tablet] 90 tablet 0    Sig: TAKE 1 TABLET (25 MG TOTAL) BY MOUTH DAILY.     Cardiovascular:  Angiotensin Receptor Blockers Passed - 10/09/2020 11:54 AM      Passed - Cr in normal range and within 180 days    Creat  Date Value Ref Range Status  09/09/2014 0.89 0.50 - 1.10 mg/dL Final   Creatinine, Ser  Date Value Ref Range Status  06/17/2020 0.86 0.57 - 1.00 mg/dL Final         Passed - K in normal range and within 180 days    Potassium  Date Value Ref Range Status  06/17/2020 4.7 3.5 - 5.2 mmol/L Final         Passed - Patient is not pregnant      Passed - Last BP in normal range    BP Readings from Last 1 Encounters:  06/17/20 138/86         Passed - Valid encounter within last 6 months    Recent Outpatient Visits          3 months ago Essential hypertension   [place], Vernia Buff, NP   8 months ago [place], Vernia Buff, NP   11 months ago Essential hypertension   [place], Vernia Buff, NP   1 year ago Essential hypertension   [place], Vernia Buff, NP   1 year ago Essential hypertension   [place], Vernia Buff, NP      Future Appointments            In 4 days Camillia Herter, NP [place]

## 2020-10-12 MED FILL — LOSARTAN POTASSIUM 25 MG TA: 25 | 90 days supply | Qty: 90 | Fill #0

## 2020-10-13 ENCOUNTER — Encounter: Payer: Self-pay | Admitting: Pharmacist

## 2020-10-13 ENCOUNTER — Ambulatory Visit (HOSPITAL_BASED_OUTPATIENT_CLINIC_OR_DEPARTMENT_OTHER): Payer: 59 | Admitting: Pharmacist

## 2020-10-13 ENCOUNTER — Other Ambulatory Visit: Payer: Self-pay

## 2020-10-13 ENCOUNTER — Other Ambulatory Visit: Payer: Self-pay | Admitting: Family

## 2020-10-13 ENCOUNTER — Encounter: Payer: Self-pay | Admitting: Family

## 2020-10-13 ENCOUNTER — Ambulatory Visit: Payer: 59 | Attending: Family | Admitting: Family

## 2020-10-13 VITALS — BP 132/89 | HR 66 | Resp 16 | Wt 231.2 lb

## 2020-10-13 DIAGNOSIS — I10 Essential (primary) hypertension: Secondary | ICD-10-CM | POA: Diagnosis not present

## 2020-10-13 DIAGNOSIS — M545 Low back pain, unspecified: Secondary | ICD-10-CM

## 2020-10-13 DIAGNOSIS — Z23 Encounter for immunization: Secondary | ICD-10-CM

## 2020-10-13 DIAGNOSIS — L2089 Other atopic dermatitis: Secondary | ICD-10-CM

## 2020-10-13 DIAGNOSIS — G8929 Other chronic pain: Secondary | ICD-10-CM

## 2020-10-13 DIAGNOSIS — R7303 Prediabetes: Secondary | ICD-10-CM | POA: Diagnosis not present

## 2020-10-13 MED ORDER — AMLODIPINE BESYLATE 10 MG PO TABS: ORAL_TABLET | ORAL | 0 refills | Status: DC

## 2020-10-13 MED ORDER — CYCLOBENZAPRINE HCL 10 MG PO TABS: ORAL_TABLET | ORAL | 2 refills | Status: DC

## 2020-10-13 MED ORDER — TRIAMCINOLONE ACETONIDE 0.025 % EX OINT: 1.0000 "application " | TOPICAL_OINTMENT | Freq: Two times a day (BID) | CUTANEOUS | 2 refills | Status: DC

## 2020-10-13 MED ORDER — LOSARTAN POTASSIUM 25 MG PO TABS: ORAL_TABLET | ORAL | 0 refills | Status: DC

## 2020-10-13 MED FILL — TRIAMCINOLONE 0.025% OINT: 0.025 | 30 days supply | Qty: 60 | Fill #0

## 2020-10-13 NOTE — Progress Notes (Signed)
Patient ID: Monica Brennan, female    DOB: May 15, 1976  MRN: [id number]  CC: Hypertension and Medication Refill  Subjective: Monica Brennan is a 44 y.o. female with history of hypertension, tinea curis, fibroid uterus, lipoma of back, and right-sided low back pain without sciatica who presents for medication refills.  1. HYPERTENSION FOLLOW-UP: 06/17/2020: Visit with nurse practitioner [name]. Continued on Losartan and Amlodipine.  10/14/2020: Currently taking: see medication list  Med Adherence: [x]  Yes , and ran out of medication 1 week ago Medication side effects: []  Yes    [x]  No Adherence with salt restriction: [x]  Yes    []  No Exercise: Yes [x]  No []  Home Monitoring?: []  Yes    [x]  No Monitoring Frequency: []  Yes    [x]  No  Home BP results range: []  Yes    [x]  No Smoking []  Yes [x]  No SOB? [x]  Yes, sometimes and may be related to side effects from Covid diagnosis in February 2021 Chest Pain?: []  Yes    [x]  No Leg swelling?: []  Yes    [x]  No Headaches?: [x]  Yes, migraines history taking excedrin and helps  Dizziness? [x]  Yes, once last year   2. PRE-DIABETES FOLLOW-UP: 06/17/2020: Visit with nurse practitioner [name]. Labs obtained. No medications prescribed.  10/14/2020: Last A1C: 5.7% on 06/17/2020 Home Monitoring?  []  Yes    [x]  No Diet Adherence: [x]  Yes, trying Exercise: []  Yes    []  No Hypoglycemic episodes?: []  Yes    []  No Numbness of the feet? []  Yes    [x]  No   3. BACK PAIN FOLLOW-UP: 11/05/2019:  Visit with nurse practitioner [name]. Cyclobenzaprine for back pain.  10/14/2020: Back pain is about the same since last visit and requests refills.   4. DERMATITIS FOLLOW-UP: 06/17/2020: Visit with nurse practitioner [name]. Triamcinolone prescribed.   10/14/2020: Improved with cream. Problem areas are bilateral inner arms, folds of elbows, breasts, and neck.  Patient Active Problem List   Diagnosis Date Noted  . Influenza vaccine needed  10/13/2020  . Tinea cruris 01/20/2017  . Fibroid uterus 06/24/2016  . 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 Right-sided low back pain without sciatica 06/16/2016  . Pelvic pain in female 06/16/2016  . Vaginal discharge 06/16/2016  . Menopausal symptoms 08/28/2015  . Hypertension 09/09/2014     Current Outpatient Medications on File Prior to Visit  Medication Sig Dispense Refill  . nystatin cream (MYCOSTATIN) Apply 1 application topically 2 (two) times daily. To breast area and groin (Patient not taking: Reported on 10/13/2020) 60 g 1   No current facility-administered medications on file prior to visit.    No Known Allergies  Social History   Socioeconomic History  . Marital status: Significant Other    Spouse name: Not on file  . Number of children: 3  . Years of education: some colle  . Highest education level: Not on file  Occupational History  . Occupation: TEACHER    Employer: [employer]  Tobacco Use  . Smoking status: Never Smoker  . Smokeless tobacco: Never Used  Vaping Use  . Vaping Use: Never used  Substance and Sexual Activity  . Alcohol use: No  . Drug use: No  . Sexual activity: Yes    Birth control/protection: None  Other Topics Concern  . Not on file  Social History Narrative   Teaches infants.   Lives with 3 children and fiance.    Social Determinants of Health   Financial Resource Strain:   . Difficulty of  Paying Living Expenses: Not on file  Food Insecurity:   . Worried About Charity fundraiser in the Last Year: Not on file  . Ran Out of Food in the Last Year: Not on file  Transportation Needs:   . Lack of Transportation (Medical): Not on file  . Lack of Transportation (Non-Medical): Not on file  Physical Activity:   . Days of Exercise per Week: Not on file  . Minutes of Exercise per Session: Not on file  Stress:   . Feeling of Stress : Not on file  Social Connections:   . Frequency of Communication with Friends and Family: Not on file  .  Frequency of Social Gatherings with Friends and Family: Not on file  . Attends Religious Services: Not on file  . Active Member of Clubs or Organizations: Not on file  . Attends Archivist Meetings: Not on file  . Marital Status: Not on file  Intimate Partner Violence:   . Fear of Current or Ex-Partner: Not on file  . Emotionally Abused: Not on file  . Physically Abused: Not on file  . Sexually Abused: Not on file    Family History  Problem Relation Age of Onset  . Hypertension Mother   . Heart disease Mother   . Diabetes Brother   . Diabetes Maternal Aunt   . Cancer Maternal Aunt        lung   . Diabetes Son   . Thyroid disease Son   . ADD / ADHD Daughter   . ADD / ADHD Son   . Epilepsy Son     Past Surgical History:  Procedure Laterality Date  . CESAREAN SECTION  2006  . TUBAL LIGATION  2006    ROS: Review of Systems Negative except as stated above  PHYSICAL EXAM: BP 132/89   Pulse 66   Resp 16   Wt 231 lb 3.2 oz (104.9 kg)   SpO2 99%   BMI 40.96 kg/m   Wt Readings from Last 3 Encounters:  10/13/20 231 lb 3.2 oz (104.9 kg)  06/17/20 231 lb (104.8 kg)  11/05/19 237 lb (107.5 kg)   Physical Exam General appearance - alert, well appearing, and in no distress and oriented to person, place, and time Mental status - alert, oriented to person, place, and time, normal mood, behavior, speech, dress, motor activity, and thought processes Neck - supple, no significant adenopathy Lymphatics - no palpable lymphadenopathy, no hepatosplenomegaly Chest - clear to auscultation, no wheezes, rales or rhonchi, symmetric air entry, no tachypnea, retractions or cyanosis Heart - normal rate, regular rhythm, normal S1, S2, no murmurs, rubs, clicks or gallops Neurological - alert, oriented, normal speech, no focal findings or movement disorder noted, cranial nerves II through XII intact, normal muscle tone, no tremors, strength 5/5 Skin - rough, dry patchy areas of  macular rash near bilateral antecubital space and bilateral forearms  ASSESSMENT AND PLAN: 1. Essential hypertension: - Blood pressure close to goal.  - Continue Amlodipine and Losartan as prescribed.  - Counseled on blood pressure goal of less than 130/80, low-sodium, DASH diet, medication compliance, 150 minutes of moderate intensity exercise per week as tolerated. Discussed medication compliance, adverse effects. - BMP to check kidney function and electrolyte balance.  - Follow-up with primary provider in 3 months or sooner if needed. - Basic Metabolic Panel - amLODipine (NORVASC) 10 MG tablet; TAKE 1 TABLET (10 MG TOTAL) BY MOUTH DAILY. Please fill as a 90 day supply  Dispense:  90 tablet; Refill: 0 - losartan (COZAAR) 25 MG tablet; TAKE 1 TABLET (25 MG TOTAL) BY MOUTH DAILY.  Dispense: 90 tablet; Refill: 0  2. Prediabetes: - Hemoglobin A1C to check level of pre-diabetes control. - Hemoglobin A1c  3. Chronic right-sided low back pain without sciatica: - Continue Cyclobenzaprine as prescribed. May cause drowsiness. Counseled to not consume if operating heavy machinery or driving. Counseled to not consume with alcohol. - Follow-up with primary provider as scheduled.  - cyclobenzaprine (FLEXERIL) 10 MG tablet; TAKE 1 TABLET BY MOUTH 3 (THREE) TIMES DAILY AS NEEDED FOR MUSCLE SPASMS.  Dispense: 60 tablet; Refill: 2  4. Flexural atopic dermatitis: - Continue Triamcinolone ointment as prescribed.  - Follow-up with primary provider as scheduled. - triamcinolone (KENALOG) 0.025 % ointment; Apply 1 application topically 2 (two) times daily.  Dispense: 60 g; Refill: 2  5. Influenza vaccine needed: - Influenza vaccine administered during today's visit.  Patient was given the opportunity to ask questions.  Patient verbalized understanding of the plan and was able to repeat key elements of the plan. Patient was given clear instructions to go to Emergency Department or return to medical center if  symptoms don't improve, worsen, or new problems develop.The patient verbalized understanding.   Orders Placed This Encounter  Procedures  . Hemoglobin A1c  . Basic Metabolic Panel     Requested Prescriptions   Signed Prescriptions Disp Refills  . amLODipine (NORVASC) 10 MG tablet 90 tablet 0    Sig: TAKE 1 TABLET (10 MG TOTAL) BY MOUTH DAILY. Please fill as a 90 day supply  . cyclobenzaprine (FLEXERIL) 10 MG tablet 60 tablet 2    Sig: TAKE 1 TABLET BY MOUTH 3 (THREE) TIMES DAILY AS NEEDED FOR MUSCLE SPASMS.  Marland Kitchen losartan (COZAAR) 25 MG tablet 90 tablet 0    Sig: TAKE 1 TABLET (25 MG TOTAL) BY MOUTH DAILY.  Marland Kitchen triamcinolone (KENALOG) 0.025 % ointment 60 g 2    Sig: Apply 1 application topically 2 (two) times daily.    Return in about 3 months (around 01/13/2021) for [place].  Camillia Herter, NP

## 2020-10-13 NOTE — Progress Notes (Signed)
Patient presents for vaccination against influenza per orders of [facility]. Consent given. Counseling provided. No contraindications exists. Vaccine administered without incident.  ° °Luke Van Ausdall, PharmD, CPP °Clinical Pharmacist °[facility] °[phone] ° °

## 2020-10-13 NOTE — Patient Instructions (Addendum)
Losartan and Amlodipine for high blood pressure.   Flexeril for back pain.   Triamcinolone cream for dermatitis.  Lab today  Flu vaccine today  Return in 3 months or sooner if needed.  Influenza Virus Vaccine injection (Fluarix) What is this medicine? INFLUENZA VIRUS VACCINE (in floo EN zuh VAHY ruhs vak SEEN) helps to reduce the risk of getting influenza also known as the flu. This medicine may be used for other purposes; ask your health care provider or pharmacist if you have questions. COMMON BRAND NAME(S): Fluarix, Fluzone What should I tell my health care provider before I take this medicine? They need to know if you have any of these conditions:  bleeding disorder like hemophilia  fever or infection  Guillain-Barre syndrome or other neurological problems  immune system problems  infection with the human immunodeficiency virus (HIV) or AIDS  low blood platelet counts  multiple sclerosis  an unusual or allergic reaction to influenza virus vaccine, eggs, chicken proteins, latex, gentamicin, other medicines, foods, dyes or preservatives  pregnant or trying to get pregnant  breast-feeding How should I use this medicine? This vaccine is for injection into a muscle. It is given by a health care professional. A copy of Vaccine Information Statements will be given before each vaccination. Read this sheet carefully each time. The sheet may change frequently. Talk to your pediatrician regarding the use of this medicine in children. Special care may be needed. Overdosage: If you think you have taken too much of this medicine contact a poison control center or emergency room at once. NOTE: This medicine is only for you. Do not share this medicine with others. What if I miss a dose? This does not apply. What may interact with this medicine?  chemotherapy or radiation therapy  medicines that lower your immune system like etanercept, anakinra, infliximab, and  adalimumab  medicines that treat or prevent blood clots like warfarin  phenytoin  steroid medicines like prednisone or cortisone  theophylline  vaccines This list may not describe all possible interactions. Give your health care provider a list of all the medicines, herbs, non-prescription drugs, or dietary supplements you use. Also tell them if you smoke, drink alcohol, or use illegal drugs. Some items may interact with your medicine. What should I watch for while using this medicine? Report any side effects that do not go away within 3 days to your doctor or health care professional. Call your health care provider if any unusual symptoms occur within 6 weeks of receiving this vaccine. You may still catch the flu, but the illness is not usually as bad. You cannot get the flu from the vaccine. The vaccine will not protect against colds or other illnesses that may cause fever. The vaccine is needed every year. What side effects may I notice from receiving this medicine? Side effects that you should report to your doctor or health care professional as soon as possible:  allergic reactions like skin rash, itching or hives, swelling of the face, lips, or tongue Side effects that usually do not require medical attention (report to your doctor or health care professional if they continue or are bothersome):  fever  headache  muscle aches and pains  pain, tenderness, redness, or swelling at site where injected  weak or tired This list may not describe all possible side effects. Call your doctor for medical advice about side effects. You may report side effects to FDA at 1-800-FDA-1088. Where should I keep my medicine? This vaccine is  only given in a clinic, pharmacy, doctor's office, or other health care setting and will not be stored at home. NOTE: This sheet is a summary. It may not cover all possible information. If you have questions about this medicine, talk to your doctor, pharmacist,  or health care provider.  2020 Elsevier/Gold Standard (2008-06-18 09:30:40)

## 2020-10-14 LAB — HEMOGLOBIN A1C
Est. average glucose Bld gHb Est-mCnc: 117 mg/dL
Hgb A1c MFr Bld: 5.7 % — ABNORMAL HIGH (ref 4.8–5.6)

## 2020-10-14 LAB — BASIC METABOLIC PANEL
BUN/Creatinine Ratio: 8 — ABNORMAL LOW (ref 9–23)
BUN: 7 mg/dL (ref 6–24)
CO2: 25 mmol/L (ref 20–29)
Calcium: 9.6 mg/dL (ref 8.7–10.2)
Chloride: 103 mmol/L (ref 96–106)
Creatinine, Ser: 0.85 mg/dL (ref 0.57–1.00)
GFR calc Af Amer: 95 mL/min/{1.73_m2} (ref >59)
GFR calc non Af Amer: 82 mL/min/{1.73_m2} (ref >59)
Glucose: 91 mg/dL (ref 65–99)
Potassium: 4.7 mmol/L (ref 3.5–5.2)
Sodium: 140 mmol/L (ref 134–144)

## 2020-10-15 NOTE — Progress Notes (Signed)
Please call patient with update.   Pre-diabetes is the same since last visit. Continue heart healthy/carb friendly diet and exercise as tolerated.  Keep all appointments with primary provider.

## 2021-03-23 ENCOUNTER — Other Ambulatory Visit: Payer: Self-pay | Admitting: Nurse Practitioner

## 2021-03-23 DIAGNOSIS — Z1231 Encounter for screening mammogram for malignant neoplasm of breast: Secondary | ICD-10-CM

## 2021-03-31 ENCOUNTER — Other Ambulatory Visit: Payer: Self-pay

## 2021-03-31 ENCOUNTER — Encounter: Payer: Self-pay | Admitting: Nurse Practitioner

## 2021-03-31 ENCOUNTER — Other Ambulatory Visit (HOSPITAL_COMMUNITY)
Admission: RE | Admit: 2021-03-31 | Discharge: 2021-03-31 | Disposition: A | Discharge status: Home / Self Care | Payer: 59 | Source: Ambulatory Visit | Attending: Nurse Practitioner | Admitting: Nurse Practitioner

## 2021-03-31 ENCOUNTER — Ambulatory Visit: Payer: 59 | Attending: Nurse Practitioner | Admitting: Nurse Practitioner

## 2021-03-31 VITALS — BP 144/79 | HR 76 | Resp 18 | Ht 64.0 in | Wt 237.6 lb

## 2021-03-31 DIAGNOSIS — B379 Candidiasis, unspecified: Secondary | ICD-10-CM | POA: Diagnosis not present

## 2021-03-31 DIAGNOSIS — Z124 Encounter for screening for malignant neoplasm of cervix: Secondary | ICD-10-CM | POA: Insufficient documentation

## 2021-03-31 DIAGNOSIS — L988 Other specified disorders of the skin and subcutaneous tissue: Secondary | ICD-10-CM

## 2021-03-31 DIAGNOSIS — I10 Essential (primary) hypertension: Secondary | ICD-10-CM | POA: Diagnosis not present

## 2021-03-31 DIAGNOSIS — D219 Benign neoplasm of connective and other soft tissue, unspecified: Secondary | ICD-10-CM

## 2021-03-31 DIAGNOSIS — N649 Disorder of breast, unspecified: Secondary | ICD-10-CM

## 2021-03-31 DIAGNOSIS — M21962 Unspecified acquired deformity of left lower leg: Secondary | ICD-10-CM

## 2021-03-31 DIAGNOSIS — R7303 Prediabetes: Secondary | ICD-10-CM | POA: Diagnosis not present

## 2021-03-31 DIAGNOSIS — R102 Pelvic and perineal pain: Secondary | ICD-10-CM | POA: Diagnosis not present

## 2021-03-31 LAB — POCT URINALYSIS DIP (CLINITEK)
Bilirubin, UA: NEGATIVE
Glucose, UA: NEGATIVE mg/dL
Ketones, POC UA: NEGATIVE mg/dL
Leukocytes, UA: NEGATIVE
Nitrite, UA: NEGATIVE
POC PROTEIN,UA: NEGATIVE
Spec Grav, UA: >=1.03 — AB (ref 1.010–1.025)
Urobilinogen, UA: 0.2 E.U./dL
pH, UA: 7 (ref 5.0–8.0)

## 2021-03-31 MED ORDER — NYSTATIN 100000 UNIT/GM EX CREA
1.0000 "application " | TOPICAL_CREAM | Freq: Two times a day (BID) | CUTANEOUS | 1 refills | Status: DC
  Filled 2021-03-31: qty 60, 20d supply, fill #0
  Filled 2021-10-18: qty 60, 20d supply, fill #1

## 2021-03-31 MED ORDER — LOSARTAN POTASSIUM 25 MG PO TABS
ORAL_TABLET | ORAL | 0 refills | Status: DC
  Filled 2021-03-31: qty 90, 90d supply, fill #0

## 2021-03-31 MED ORDER — AMLODIPINE BESYLATE 10 MG PO TABS
ORAL_TABLET | ORAL | 0 refills | Status: DC
  Filled 2021-03-31: qty 90, 90d supply, fill #0

## 2021-03-31 MED ORDER — DICLOFENAC SODIUM 1 % EX GEL
2.0000 g | Freq: Four times a day (QID) | CUTANEOUS | 0 refills | Status: AC
  Filled 2021-03-31: qty 100, 13d supply, fill #0

## 2021-03-31 MED ORDER — TRIAMCINOLONE ACETONIDE 0.025 % EX OINT
TOPICAL_OINTMENT | Freq: Two times a day (BID) | CUTANEOUS | 2 refills | Status: DC
  Filled 2021-03-31: qty 60, 20d supply, fill #0
  Filled 2021-10-18: qty 60, 20d supply, fill #1

## 2021-03-31 NOTE — Progress Notes (Signed)
Assessment & Plan:  Monica Brennan was seen today for gynecologic exam.  Diagnoses and all orders for this visit:  Encounter for Papanicolaou smear for cervical cancer screening -     Cytology - PAP -     Cervicovaginal ancillary only -     POCT URINALYSIS DIP (CLINITEK)  Candida rash of groin -     nystatin cream (MYCOSTATIN); Apply 1 application topically 2 (two) times daily. To breast area and groin  Prediabetes -     Hemoglobin A1c  Pelvic pain Fibroids -     US PELVIC COMPLETE WITH TRANSVAGINAL; Future POCT URINE  Skin lesion of breast -     Ambulatory referral to Dermatology  Deformity of left foot -     Ambulatory referral to Podiatry -     diclofenac Sodium (VOLTAREN) 1 % GEL; Apply 2 g topically 4 (four) times daily.  Primary hypertension -     Basic metabolic panel -     amLODipine (NORVASC) 10 MG tablet; TAKE 1 TABLET (10 MG TOTAL) BY MOUTH DAILY. Please fill as a 90 day supply -     losartan (COZAAR) 25 MG tablet; TAKE 1 TABLET (25 MG TOTAL) BY MOUTH DAILY. Continue all antihypertensives as prescribed.  Remember to bring in your blood pressure log with you for your follow up appointment.  DASH/Mediterranean Diets are healthier choices for HTN.    Flexural atopic dermatitis -     triamcinolone (KENALOG) 0.025 % ointment; APPLY 1 APPLICATION TOPICALLY 2 (TWO) TIMES DAILY.     Patient has been counseled on age-appropriate routine health concerns for screening and prevention. These are reviewed and up-to-date. Referrals have been placed accordingly. Immunizations are up-to-date or declined.    Subjective:   Chief Complaint  Patient presents with  . Gynecologic Exam   HPI Monica Brennan 45 y.o. female presents to office today for pap smear.    Essential Hypertension Not well controlled. Her last prescription of amlodipine and losartan expired in February. It does not appear she has picked them up since then. Denies chest pain, shortness of breath,  palpitations, lightheadedness, dizziness, headaches or BLE edema.  BP Readings from Last 3 Encounters:  03/31/21 (!) 144/79  10/13/20 132/89  06/17/20 138/86    Foot Problem She has a bony deformity of the left dorsal foot.  She denies any injury or trauma to her left foot. States she noticed it about a year ago and pain is present with pressure and weight bearing.   Pelvic pain Endorses chronic right sided pelvic pain. She has a history of uterine fibroids. Uterus is enlarged on pelvic exam today.   Skin Lesion She has a skin lesion on the right areola that comes and goes. It lessened in size significantly when she was using triamcinolone daily. After she stopped the lesion increased in size.  Sometimes drains and is hypopigmented in color. The size has increased over the years.  Associated symptoms include itching.     Review of Systems  Constitutional: Negative.  Negative for chills, fever, malaise/fatigue and weight loss.  Respiratory: Negative.  Negative for cough, shortness of breath and wheezing.   Cardiovascular: Negative.  Negative for chest pain, orthopnea and leg swelling.  Gastrointestinal: Positive for abdominal pain.  Genitourinary: Negative.  Negative for flank pain.  Musculoskeletal: Positive for joint pain.  Skin: Positive for rash.  Psychiatric/Behavioral: Negative for suicidal ideas.    Past Medical History:  Diagnosis Date  . Hypertension Dx 2006  previously, on lisinopril. stopped lisinopril when medicaid ran out in 2007.   . Morbid obesity ([place])   . Prediabetes     Past Surgical History:  Procedure Laterality Date  . CESAREAN SECTION  2006  . TUBAL LIGATION  2006    Family History  Problem Relation Age of Onset  . Hypertension Mother   . Heart disease Mother   . Diabetes Brother   . Diabetes Maternal Aunt   . Cancer Maternal Aunt        lung   . Diabetes Son   . Thyroid disease Son   . ADD / ADHD Daughter   . ADD / ADHD Son   . Epilepsy  Son     Social History Reviewed with no changes to be made today.   Outpatient Medications Prior to Visit  Medication Sig Dispense Refill  . cyclobenzaprine (FLEXERIL) 10 MG tablet TAKE 1 TABLET BY MOUTH 3 (THREE) TIMES DAILY AS NEEDED FOR MUSCLE SPASMS. 60 tablet 2  . amLODipine (NORVASC) 10 MG tablet TAKE 1 TABLET (10 MG TOTAL) BY MOUTH DAILY. Please fill as a 90 day supply 90 tablet 0  . losartan (COZAAR) 25 MG tablet TAKE 1 TABLET (25 MG TOTAL) BY MOUTH DAILY. 90 tablet 0  . nystatin cream (MYCOSTATIN) Apply 1 application topically 2 (two) times daily. To breast area and groin 60 g 1  . triamcinolone (KENALOG) 0.025 % ointment APPLY 1 APPLICATION TOPICALLY 2 (TWO) TIMES DAILY. 60 g 2   No facility-administered medications prior to visit.    No Known Allergies     Objective:    BP (!) 144/79   Pulse 76   Resp 18   Ht 5\' 4"  (1.626 m)   Wt 237 lb 9.6 oz (107.8 kg)   SpO2 99%   BMI 40.78 kg/m  Wt Readings from Last 3 Encounters:  03/31/21 237 lb 9.6 oz (107.8 kg)  10/13/20 231 lb 3.2 oz (104.9 kg)  06/17/20 231 lb (104.8 kg)    Physical Exam Vitals and nursing note reviewed. Exam conducted with a chaperone present.  Constitutional:      Appearance: She is well-developed.  HENT:     Head: Normocephalic and atraumatic.  Cardiovascular:     Rate and Rhythm: Normal rate and regular rhythm.     Heart sounds: Normal heart sounds. No murmur heard. No friction rub. No gallop.   Pulmonary:     Effort: Pulmonary effort is normal. No tachypnea or respiratory distress.     Breath sounds: Normal breath sounds. No decreased breath sounds, wheezing, rhonchi or rales.  Chest:     Chest wall: No mass or tenderness.  Breasts:     Right: Skin change present. No swelling, bleeding, mass, axillary adenopathy or supraclavicular adenopathy.     Left: Inverted nipple present. No axillary adenopathy or supraclavicular adenopathy.    Abdominal:     General: Bowel sounds are normal.      Palpations: Abdomen is soft.     Hernia: There is no hernia in the left inguinal area.  Genitourinary:    Exam position: Lithotomy position.     Labia:        Right: No rash, tenderness, lesion or injury.        Left: No rash, tenderness, lesion or injury.      Vagina: Normal. No signs of injury and foreign body. No vaginal discharge, erythema, tenderness or bleeding.     Cervix: No cervical motion tenderness or friability.  Uterus: Not deviated and not enlarged.      Adnexa:        Right: No mass, tenderness or fullness.         Left: No mass, tenderness or fullness.       Rectum: Normal. No external hemorrhoid.  Musculoskeletal:        General: Normal range of motion.     Cervical back: Normal range of motion.     Left foot: Deformity present.       Feet:  Lymphadenopathy:     Upper Body:     Right upper body: No supraclavicular, axillary or pectoral adenopathy.     Left upper body: No supraclavicular, axillary or pectoral adenopathy.     Lower Body: No right inguinal adenopathy. No left inguinal adenopathy.  Skin:    General: Skin is warm and dry.  Neurological:     Mental Status: She is alert and oriented to person, place, and time.     Coordination: Coordination normal.  Psychiatric:        Behavior: Behavior normal. Behavior is cooperative.        Thought Content: Thought content normal.        Judgment: Judgment normal.          Patient has been counseled extensively about nutrition and exercise as well as the importance of adherence with medications and regular follow-up. The patient was given clear instructions to go to ER or return to medical center if symptoms don't improve, worsen or new problems develop. The patient verbalized understanding.   Follow-up: Return in about 4 weeks (around 04/28/2021) for BP CHECK WITH LUKE.   Gildardo Pounds, FNP-BC [facility] [place], [place]   03/31/2021, 3:26 PM

## 2021-04-01 ENCOUNTER — Other Ambulatory Visit: Payer: Self-pay | Admitting: Nurse Practitioner

## 2021-04-01 ENCOUNTER — Other Ambulatory Visit: Payer: Self-pay

## 2021-04-01 LAB — CERVICOVAGINAL ANCILLARY ONLY
Bacterial Vaginitis (gardnerella): POSITIVE — AB
Candida Glabrata: NEGATIVE
Candida Vaginitis: NEGATIVE
Chlamydia: NEGATIVE
Comment: NEGATIVE
Comment: NEGATIVE
Comment: NEGATIVE
Comment: NEGATIVE
Comment: NEGATIVE
Comment: NORMAL
Neisseria Gonorrhea: NEGATIVE
Trichomonas: NEGATIVE

## 2021-04-01 LAB — BASIC METABOLIC PANEL
BUN/Creatinine Ratio: 6 — ABNORMAL LOW (ref 9–23)
BUN: 6 mg/dL (ref 6–24)
CO2: 24 mmol/L (ref 20–29)
Calcium: 9.3 mg/dL (ref 8.7–10.2)
Chloride: 101 mmol/L (ref 96–106)
Creatinine, Ser: 0.94 mg/dL (ref 0.57–1.00)
Glucose: 88 mg/dL (ref 65–99)
Potassium: 4.3 mmol/L (ref 3.5–5.2)
Sodium: 140 mmol/L (ref 134–144)
eGFR: 75 mL/min/{1.73_m2} (ref >59)

## 2021-04-01 LAB — HEMOGLOBIN A1C
Est. average glucose Bld gHb Est-mCnc: 108 mg/dL
Hgb A1c MFr Bld: 5.4 % (ref 4.8–5.6)

## 2021-04-01 MED ORDER — METRONIDAZOLE 500 MG PO TABS
500.0000 mg | ORAL_TABLET | Freq: Two times a day (BID) | ORAL | 0 refills | Status: AC
  Filled 2021-04-01: qty 14, 7d supply, fill #0

## 2021-04-02 ENCOUNTER — Other Ambulatory Visit: Payer: Self-pay | Admitting: Nurse Practitioner

## 2021-04-02 ENCOUNTER — Encounter: Payer: Self-pay | Admitting: Nurse Practitioner

## 2021-04-02 ENCOUNTER — Other Ambulatory Visit: Payer: Self-pay

## 2021-04-02 DIAGNOSIS — M545 Low back pain, unspecified: Secondary | ICD-10-CM

## 2021-04-02 DIAGNOSIS — G8929 Other chronic pain: Secondary | ICD-10-CM

## 2021-04-02 LAB — CYTOLOGY - PAP
Comment: NEGATIVE
Diagnosis: NEGATIVE
High risk HPV: NEGATIVE

## 2021-04-02 MED ORDER — CYCLOBENZAPRINE HCL 10 MG PO TABS
ORAL_TABLET | ORAL | 2 refills | Status: DC
Start: 2021-04-02 — End: 2021-12-15
  Filled 2021-04-02: qty 60, 20d supply, fill #0
  Filled 2021-07-01: qty 60, 20d supply, fill #1
  Filled 2021-10-18: qty 60, 20d supply, fill #2

## 2021-04-04 HISTORY — PX: FOOT SURGERY: SHX648

## 2021-04-08 ENCOUNTER — Telehealth: Payer: Self-pay | Admitting: Physician Assistant

## 2021-04-08 ENCOUNTER — Other Ambulatory Visit: Payer: Self-pay

## 2021-04-08 NOTE — Telephone Encounter (Signed)
Referral attached to appointment

## 2021-04-08 NOTE — Telephone Encounter (Signed)
Patient is calling for a referral appointment from [name], NP.  Patient is scheduled for 09/23/2021 at 10:30 with [facility], PA-C.

## 2021-04-09 ENCOUNTER — Ambulatory Visit (INDEPENDENT_AMBULATORY_CARE_PROVIDER_SITE_OTHER): Payer: 59 | Admitting: Podiatry

## 2021-04-09 ENCOUNTER — Ambulatory Visit (HOSPITAL_COMMUNITY)
Admission: RE | Admit: 2021-04-09 | Discharge: 2021-04-09 | Disposition: A | Discharge status: Home / Self Care | Payer: 59 | Source: Ambulatory Visit | Attending: Nurse Practitioner | Admitting: Nurse Practitioner

## 2021-04-09 ENCOUNTER — Ambulatory Visit (INDEPENDENT_AMBULATORY_CARE_PROVIDER_SITE_OTHER): Payer: 59

## 2021-04-09 ENCOUNTER — Other Ambulatory Visit: Payer: Self-pay

## 2021-04-09 ENCOUNTER — Encounter: Payer: Self-pay | Admitting: Podiatry

## 2021-04-09 DIAGNOSIS — M79672 Pain in left foot: Secondary | ICD-10-CM

## 2021-04-09 DIAGNOSIS — D219 Benign neoplasm of connective and other soft tissue, unspecified: Secondary | ICD-10-CM | POA: Diagnosis present

## 2021-04-09 DIAGNOSIS — M79671 Pain in right foot: Secondary | ICD-10-CM

## 2021-04-10 NOTE — Progress Notes (Signed)
Subjective:   Patient ID: Monica Brennan, female   DOB: 45 y.o.   MRN: [id number]   HPI Patient states she is developed some generalized foot pain bilateral and she wanted it checked.  States it gets sore when she tries to walk and its the bottom of the right foot top of the left foot and it seems somewhat improved but she wants to have it looked at again.  Patient does not smoke likes to be active   Review of Systems  All other systems reviewed and are negative.       Objective:  Physical Exam Vitals and nursing note reviewed.  Constitutional:      Appearance: She is well-developed.  Pulmonary:     Effort: Pulmonary effort is normal.  Musculoskeletal:        General: Normal range of motion.  Skin:    General: Skin is warm.  Neurological:     Mental Status: She is alert.     Neurovascular status intact muscle strength adequate range of motion adequate.  Patient is found to have mild to moderate discomfort dorsal right foot and dorsal left foot and plantar aspect right heel at the insertional point.  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  Educated her on foot pain will begin topical medicines and will be seen back if symptoms persist  X-rays were negative for signs of fracture or bony issue associated with this condition

## 2021-04-12 ENCOUNTER — Other Ambulatory Visit: Payer: Self-pay | Admitting: Nurse Practitioner

## 2021-04-12 DIAGNOSIS — D250 Submucous leiomyoma of uterus: Secondary | ICD-10-CM

## 2021-04-15 ENCOUNTER — Telehealth: Payer: Self-pay | Admitting: Urology

## 2021-04-15 NOTE — Telephone Encounter (Signed)
DOS - 04/20/21  TARSAL EXOSTECTOMY LEFT --- 94801  [facility] EFFECTIVE DATE - 12/06/19   PLAN DEDUCTIBLE - $0.00 W/ $0.00 REAMINING OUT OF POCKET - $1,500.00 W/ $1,468.33 REMAINING COINSURANCE - 20% COPAY - $0.00    RECEIVED FAX STATING THAT NO PRIOR AUTH IS REQUIRED FOR CPT CODE 65537, GOOD FROM 04/13/21 - 07/12/21.

## 2021-04-19 ENCOUNTER — Other Ambulatory Visit: Payer: Self-pay

## 2021-04-19 MED ORDER — HYDROCODONE-ACETAMINOPHEN 10-325 MG PO TABS
1.0000 | ORAL_TABLET | Freq: Three times a day (TID) | ORAL | 0 refills | Status: DC | PRN
  Filled 2021-04-19: qty 15, 5d supply, fill #0

## 2021-04-19 MED ORDER — TRAMADOL HCL 50 MG PO TABS
50.0000 mg | ORAL_TABLET | Freq: Three times a day (TID) | ORAL | 0 refills | Status: AC | PRN
  Filled 2021-04-19: qty 15, 5d supply, fill #0

## 2021-04-19 NOTE — Addendum Note (Signed)
Addended by: Wallene Huh on: 04/19/2021 05:05 PM   Modules accepted: Orders

## 2021-04-19 NOTE — Addendum Note (Signed)
Addended by: Wallene Huh on: 04/19/2021 04:04 PM   Modules accepted: Orders

## 2021-04-20 ENCOUNTER — Other Ambulatory Visit: Payer: Self-pay

## 2021-04-20 ENCOUNTER — Encounter: Payer: Self-pay | Admitting: Podiatry

## 2021-04-20 DIAGNOSIS — M25775 Osteophyte, left foot: Secondary | ICD-10-CM | POA: Diagnosis not present

## 2021-04-26 ENCOUNTER — Ambulatory Visit (INDEPENDENT_AMBULATORY_CARE_PROVIDER_SITE_OTHER): Payer: 59

## 2021-04-26 ENCOUNTER — Ambulatory Visit (INDEPENDENT_AMBULATORY_CARE_PROVIDER_SITE_OTHER): Payer: 59 | Admitting: Podiatry

## 2021-04-26 ENCOUNTER — Other Ambulatory Visit: Payer: Self-pay

## 2021-04-26 ENCOUNTER — Encounter: Payer: Self-pay | Admitting: Podiatry

## 2021-04-26 ENCOUNTER — Telehealth: Payer: Self-pay

## 2021-04-26 DIAGNOSIS — Z9889 Other specified postprocedural states: Secondary | ICD-10-CM | POA: Diagnosis not present

## 2021-04-26 DIAGNOSIS — D169 Benign neoplasm of bone and articular cartilage, unspecified: Secondary | ICD-10-CM

## 2021-04-26 NOTE — Progress Notes (Signed)
Subjective:   Patient ID: Monica Brennan, female   DOB: 45 y.o.   MRN: [id number]   HPI Patient states soreness that is generalized but no significant discomfort dorsal left   ROS      Objective:  Physical Exam  Neurovascular status intact negative Bevelyn Buckles' sign noted wound edges well coapted mild swelling around the midtarsal joint but appears to be excellent resection of bone     Assessment:  Doing well after having tarsal exostectomy left     Plan:  Advised on the continuation of anti-inflammatories and wider shoe and patient will be seen back to recheck and continue compression elevation and gradual return to soft shoe gear hopefully over the next 3 to 4 weeks  X-rays indicate satisfactory resection of bone dorsal left

## 2021-04-27 NOTE — Telephone Encounter (Signed)
Called pt. To inform her that her paperwork is ready to be picked up.

## 2021-04-28 ENCOUNTER — Other Ambulatory Visit: Payer: Self-pay

## 2021-04-28 ENCOUNTER — Encounter: Payer: Self-pay | Admitting: Pharmacist

## 2021-04-28 ENCOUNTER — Ambulatory Visit: Payer: 59 | Attending: Nurse Practitioner | Admitting: Pharmacist

## 2021-04-28 VITALS — BP 111/74 | HR 73

## 2021-04-28 DIAGNOSIS — I10 Essential (primary) hypertension: Secondary | ICD-10-CM | POA: Diagnosis not present

## 2021-04-28 NOTE — Progress Notes (Signed)
   S:   PCP: [name]  PMH: HTN  Patient arrives in good spirits. Presents to the clinic for hypertension evaluation, counseling, and management. Patient was referred and last seen by Primary Care Provider on 03/31/2021. Of note, pt had been out of blood pressure medication since February 2022. Pt was instructed to continue current regimen and bring BP log to today's visit.  Today pt reports she takes her medications daily in the morning. Endorsed she has not missed any doses since picking them up in February. She took both blood pressure medications today at 8:10 am.   Patient denies signs of chest pain, SOB, palpitations, lightheadedness, dizziness, headaches, or BLE edema.   Current BP Medications include:   - Amlodipine 10 mg tab -Take 1 tab PO daily  - Losartan 25 mg tab - Take 1 tab PO daily   Dietary habits include: Has decreased her salt and sugar intake since she was diagnosed with prediabetes.  Exercise habits include: Had foot surgery so is not very mobile. She says she only is up on her feet at work.   Family / Social history: - Has 2 brothers with DM - Denies tobacco use  - Denies alcohol use   O:  Vitals:   04/28/21 0931  BP: 111/74  Pulse: 73   Home BP readings: does not check at home  Last 3 Office BP readings: BP Readings from Last 3 Encounters:  04/28/21 111/74  03/31/21 (!) 144/79  10/13/20 132/89   BMET    Component Value Date/Time   NA 140 03/31/2021 1523   K 4.3 03/31/2021 1523   CL 101 03/31/2021 1523   CO2 24 03/31/2021 1523   GLUCOSE 88 03/31/2021 1523   GLUCOSE 97 09/09/2014 1719   GLUCOSE 94 08/29/2014 0932   BUN 6 03/31/2021 1523   CREATININE 0.94 03/31/2021 1523   CREATININE 0.89 09/09/2014 1719   CALCIUM 9.3 03/31/2021 1523   GFRNONAA 82 10/13/2020 1542   GFRNONAA 81 09/09/2014 1719   GFRAA 95 10/13/2020 1542   GFRAA >89 09/09/2014 1719   Renal function: CrCl cannot be calculated (Patient's most recent lab result is older than  the maximum 21 days allowed.).  Clinical ASCVD: No  The 10-year ASCVD risk score Mikey Bussing DC Jr., et al., 2013) is: 1.9%   Values used to calculate the score:     Age: 45 years     Sex: Female     Is Non-Hispanic African American: Yes     Diabetic: No     Tobacco smoker: No     Systolic Blood Pressure: 053 mmHg     Is BP treated: Yes     HDL Cholesterol: 42 mg/dL     Total Cholesterol: 166 mg/dL  A/P: Hypertension longstanding currently controlled on current medications. BP Goal = < 130/80 mmHg. Medication adherence reported. Pt was commended on excellent adherence and good BP reading.  Continue Amlodipine 10 mg tab -Take 1 tab daily  Continue Losartan 25 mg tab - Take 1 tab daily -Counseled on lifestyle modifications for blood pressure control including reduced dietary sodium, increased exercise, adequate sleep. Results reviewed and written information provided.Total time in face-to-face counseling 30  minutes.   F/U Clinic Visit in 1 month with [facility].   Patient seen with:  [place] Anayelli Lai, PharmD Candidate 23'  [place], PharmD, [place], [place] [phone]

## 2021-05-24 ENCOUNTER — Encounter: Payer: 59 | Admitting: Podiatry

## 2021-05-26 ENCOUNTER — Other Ambulatory Visit: Payer: Self-pay

## 2021-05-26 ENCOUNTER — Ambulatory Visit (INDEPENDENT_AMBULATORY_CARE_PROVIDER_SITE_OTHER): Payer: 59

## 2021-05-26 ENCOUNTER — Ambulatory Visit (INDEPENDENT_AMBULATORY_CARE_PROVIDER_SITE_OTHER): Payer: 59 | Admitting: Podiatry

## 2021-05-26 ENCOUNTER — Encounter: Payer: Self-pay | Admitting: Podiatry

## 2021-05-26 ENCOUNTER — Ambulatory Visit
Admission: RE | Admit: 2021-05-26 | Discharge: 2021-05-26 | Disposition: A | Discharge status: Home / Self Care | Payer: 59 | Source: Ambulatory Visit | Attending: Nurse Practitioner | Admitting: Nurse Practitioner

## 2021-05-26 DIAGNOSIS — D169 Benign neoplasm of bone and articular cartilage, unspecified: Secondary | ICD-10-CM

## 2021-05-26 DIAGNOSIS — Z1231 Encounter for screening mammogram for malignant neoplasm of breast: Secondary | ICD-10-CM

## 2021-05-26 NOTE — Progress Notes (Signed)
Subjective:   Patient ID: Monica Brennan, female   DOB: 45 y.o.   MRN: [id number]   HPI Patient presents stating I am doing good but I still get some soreness   ROS      Objective:  Physical Exam  Neurovascular status intact with well-healed surgical site dorsal left foot     Assessment:  Tarsal exostectomy left healing well with mild swelling still noted     Plan:  Reviewed condition recommended the continuation of compression as needed and reviewed x-ray with return to activity as best as possible over the next few weeks  X-rays indicate satisfactory resection of that bone dorsal left

## 2021-06-11 ENCOUNTER — Ambulatory Visit (HOSPITAL_BASED_OUTPATIENT_CLINIC_OR_DEPARTMENT_OTHER): Payer: 59 | Admitting: Obstetrics & Gynecology

## 2021-06-29 ENCOUNTER — Ambulatory Visit (HOSPITAL_BASED_OUTPATIENT_CLINIC_OR_DEPARTMENT_OTHER): Payer: 59 | Admitting: Obstetrics & Gynecology

## 2021-07-01 ENCOUNTER — Other Ambulatory Visit: Payer: Self-pay | Admitting: Nurse Practitioner

## 2021-07-01 ENCOUNTER — Other Ambulatory Visit: Payer: Self-pay

## 2021-07-01 DIAGNOSIS — I10 Essential (primary) hypertension: Secondary | ICD-10-CM

## 2021-07-01 MED ORDER — AMLODIPINE BESYLATE 10 MG PO TABS
ORAL_TABLET | ORAL | 0 refills | Status: DC
  Filled 2021-07-01: qty 90, 90d supply, fill #0

## 2021-07-01 MED ORDER — LOSARTAN POTASSIUM 25 MG PO TABS
ORAL_TABLET | ORAL | 0 refills | Status: DC
  Filled 2021-07-01: qty 90, 90d supply, fill #0

## 2021-07-07 ENCOUNTER — Other Ambulatory Visit: Payer: Self-pay

## 2021-08-10 ENCOUNTER — Other Ambulatory Visit (HOSPITAL_BASED_OUTPATIENT_CLINIC_OR_DEPARTMENT_OTHER): Payer: Self-pay

## 2021-08-24 ENCOUNTER — Ambulatory Visit (HOSPITAL_BASED_OUTPATIENT_CLINIC_OR_DEPARTMENT_OTHER): Payer: 59 | Admitting: Obstetrics & Gynecology

## 2021-08-25 ENCOUNTER — Ambulatory Visit (INDEPENDENT_AMBULATORY_CARE_PROVIDER_SITE_OTHER): Payer: 59 | Admitting: Obstetrics & Gynecology

## 2021-08-25 ENCOUNTER — Other Ambulatory Visit: Payer: Self-pay

## 2021-08-25 ENCOUNTER — Encounter (HOSPITAL_BASED_OUTPATIENT_CLINIC_OR_DEPARTMENT_OTHER): Payer: Self-pay | Admitting: Obstetrics & Gynecology

## 2021-08-25 VITALS — BP 133/89 | HR 79 | Ht 63.0 in | Wt 234.8 lb

## 2021-08-25 DIAGNOSIS — D219 Benign neoplasm of connective and other soft tissue, unspecified: Secondary | ICD-10-CM

## 2021-08-25 DIAGNOSIS — N852 Hypertrophy of uterus: Secondary | ICD-10-CM

## 2021-08-25 DIAGNOSIS — N920 Excessive and frequent menstruation with regular cycle: Secondary | ICD-10-CM

## 2021-08-25 DIAGNOSIS — Z1211 Encounter for screening for malignant neoplasm of colon: Secondary | ICD-10-CM | POA: Diagnosis not present

## 2021-08-25 NOTE — Progress Notes (Signed)
GYNECOLOGY  VISIT  CC:   discuss fibroids  HPI: 45 y.o. [id number] Significant Other Black or African American female here for discussion of fibroids.  She was referred by Monica Rankins, NP, her PCP.  Pt has hx of fibroids that were diagnosed at least five years ago.  Over time, cycles have gotten heavier and heavier.  They are regular and last about 10 days.  She can bleed for up to 9-10 days.  She does cramp and have clotting.  Sometimes the clotting is the size of a walnut.  When these pass, she does have significant cramping.  Pt also reports low back pain, some shooting pains in her thighs during menstrual cycles and low pelvic pressure.  Pt does report urinary urgency but no dysuria.  States she doesn't drink a lot during the day because she has to go to the bathroom so frequently.    Lastly, pt reports she feels cold and tired almost all of the time.  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  Ultrasound done in 2017 showed multiple fibroids and uterus measuring 12 x 8.3 x 6.7cm.  Most recent ultrasound done 04/09/2021 showed uterus measuring 17.6 x 9.3 x 11.1cm with volume of about 916ml.  Multiple fibroids present measuring 4.8, 5.0 and 6.9cm.  Endometrium was 37mm without abnormality.    We discussed treatment options for fibroids.  Due to bulk symptoms, I do think she would benefit from treatment that would shrink her fibroids.  [place], myomectomy, hysterectomy and Depo Lupron discussed.  Pt does not want surgery.  Most interested in [place].    Last pap smear 03/2021 neg with neg HR HPV.  Treatment  GYNECOLOGIC HISTORY: Patient's last menstrual period was 07/27/2021 (approximate). Contraception: BTL  Patient Active Problem List   Diagnosis Date Noted   Tinea cruris 01/20/2017   Fibroid uterus 06/24/2016   Lipoma of back 06/16/2016   Right-sided low back pain without sciatica 06/16/2016   Pelvic pain in female 06/16/2016   Menopausal symptoms  08/28/2015   Hypertension 09/09/2014    Past Medical History:  Diagnosis Date   Hypertension Dx 2006   previously, on lisinopril. stopped lisinopril when medicaid ran out in 2007.    Morbid obesity ([place])    Prediabetes     Past Surgical History:  Procedure Laterality Date   CESAREAN SECTION  12/05/2004   x 3   DILATION AND CURETTAGE, DIAGNOSTIC / THERAPEUTIC     due to miscarriage   TUBAL LIGATION  12/05/2004    MEDS:   Current Outpatient Medications on File Prior to Visit  Medication Sig Dispense Refill   amLODipine (NORVASC) 10 MG tablet TAKE 1 TABLET (10 MG TOTAL) BY MOUTH DAILY 90 tablet 0   cyclobenzaprine (FLEXERIL) 10 MG tablet TAKE 1 TABLET BY MOUTH 3 (THREE) TIMES DAILY AS NEEDED FOR MUSCLE SPASMS. 60 tablet 2   losartan (COZAAR) 25 MG tablet TAKE 1 TABLET (25 MG TOTAL) BY MOUTH DAILY. 90 tablet 0   nystatin cream (MYCOSTATIN) Apply 1 application topically 2 (two) times daily. To breast area and groin 60 g 1   triamcinolone (KENALOG) 0.025 % ointment APPLY 1 APPLICATION TOPICALLY 2 (TWO) TIMES DAILY. 60 g 2   No current facility-administered medications on file prior to visit.    ALLERGIES: Patient has no known allergies.  Family History  Problem Relation Age of Onset   Hypertension Mother    Heart disease Mother  Diabetes Brother    Diabetes Maternal Aunt    Cancer Maternal Aunt        lung    Diabetes Son    Thyroid disease Son    ADD / ADHD Daughter    ADD / ADHD Son    Epilepsy Son     SH:  has partner, non smoker  Review of Systems  Genitourinary:  Positive for menstrual problem, pelvic pain and urgency.   PHYSICAL EXAMINATION:    BP 133/89 (BP Location: Right Arm, Patient Position: Sitting, Cuff Size: Large)   Pulse 79   Ht 5\' 3"  (1.6 m)   Wt 234 lb 12.8 oz (106.5 kg)   LMP 07/27/2021 (Approximate) Comment: Periods are very irregular  BMI 41.59 kg/m     Physical Exam Constitutional:      Appearance: Normal appearance.  Pulmonary:      Effort: Pulmonary effort is normal.  Skin:    General: Skin is warm and dry.  Neurological:     General: No focal deficit present.     Mental Status: She is alert.  Psychiatric:        Behavior: Behavior normal.   Assessment/Plan: 1. Menorrhagia with regular cycle - Iron, TIBC and Ferritin Panel - CBC  2. Enlarged uterus - treatment options discussed.  Pt not interested in surgery.  Feel she needs treatment option that will help with bulk symptoms. - Ambulatory referral to Interventional Radiology for [place] considering.  Pt understands will need pelvic MRI and may need endometrial biopsy as well.  3. Fibroids  4. Colon cancer screening - pt asked if I would place referral for her and I did - Ambulatory referral to Gastroenterology

## 2021-08-26 LAB — IRON,TIBC AND FERRITIN PANEL
Ferritin: 14 ng/mL — ABNORMAL LOW (ref 15–150)
Iron Saturation: 16 % (ref 15–55)
Iron: 65 ug/dL (ref 27–159)
Total Iron Binding Capacity: 398 ug/dL (ref 250–450)
UIBC: 333 ug/dL (ref 131–425)

## 2021-08-26 LAB — CBC
Hematocrit: 32.4 % — ABNORMAL LOW (ref 34.0–46.6)
Hemoglobin: 11.1 g/dL (ref 11.1–15.9)
MCH: 24.7 pg — ABNORMAL LOW (ref 26.6–33.0)
MCHC: 34.3 g/dL (ref 31.5–35.7)
MCV: 72 fL — ABNORMAL LOW (ref 79–97)
Platelets: 474 10*3/uL — ABNORMAL HIGH (ref 150–450)
RBC: 4.5 x10E6/uL (ref 3.77–5.28)
RDW: 16.4 % — ABNORMAL HIGH (ref 11.7–15.4)
WBC: 9.7 10*3/uL (ref 3.4–10.8)

## 2021-08-27 ENCOUNTER — Telehealth: Payer: Self-pay | Admitting: *Deleted

## 2021-08-27 ENCOUNTER — Other Ambulatory Visit: Payer: Self-pay | Admitting: Obstetrics & Gynecology

## 2021-08-27 DIAGNOSIS — D259 Leiomyoma of uterus, unspecified: Secondary | ICD-10-CM

## 2021-08-27 NOTE — Telephone Encounter (Signed)
Called Ms. Monica Brennan to schedule consult for Uterine Fibroids, left a message on the machine./vm

## 2021-09-09 ENCOUNTER — Encounter: Payer: Self-pay | Admitting: Gastroenterology

## 2021-09-22 ENCOUNTER — Ambulatory Visit
Admission: RE | Admit: 2021-09-22 | Discharge: 2021-09-22 | Disposition: A | Discharge status: Home / Self Care | Payer: 59 | Source: Ambulatory Visit | Attending: Obstetrics & Gynecology | Admitting: Obstetrics & Gynecology

## 2021-09-22 ENCOUNTER — Other Ambulatory Visit: Payer: Self-pay

## 2021-09-22 DIAGNOSIS — D259 Leiomyoma of uterus, unspecified: Secondary | ICD-10-CM

## 2021-09-22 HISTORY — PX: IR RADIOLOGIST EVAL & MGMT: IMG5224

## 2021-09-22 NOTE — Progress Notes (Signed)
Chief Complaint: Patient was seen in consultation today for abnormal uterine bleeding in the setting of uterine fibroids  Referring Physician(s): Megan Salon, MD  History of Present Illness: Monica Brennan is a 45 y.o. G3P2 female presenting today as a gracious referral from Dr. Sabra Heck for symptomatic uterine fibroids.  Approximately 5 years ago she began to have heavy menstrual cycles.  The cycles are regular and usually last 5-6 days but recently have lasted up to 8 days.  She reports heavy bleeding to the point that she has to use washcloths in her underwear in addition to tampons and pads.  She states that she has experienced blood flowing freely down her legs during showers.  She currently works at a preschool, and the heavy bleeding is difficult to manage while also taking care of her students.  She endorses urinary frequency and urgency.  Some dyspareunia.  No post-coital ache. She has never tried any hormonal medication for her abnormal bleeding.  She has had 3 prior Cesarean sections and bilateral tubal ligation (2006).    Past Medical History:  Diagnosis Date   Hypertension Dx 2006   previously, on lisinopril. stopped lisinopril when medicaid ran out in 2007.    Morbid obesity ([place])    Prediabetes     Past Surgical History:  Procedure Laterality Date   CESAREAN SECTION  12/05/2004   x 3   DILATION AND CURETTAGE, DIAGNOSTIC / THERAPEUTIC     due to miscarriage   TUBAL LIGATION  12/05/2004    Allergies: Patient has no known allergies.  Medications: Prior to Admission medications   Medication Sig Start Date End Date Taking? Authorizing Provider  amLODipine (NORVASC) 10 MG tablet TAKE 1 TABLET (10 MG TOTAL) BY MOUTH DAILY 07/01/21   Gildardo Pounds, NP  cyclobenzaprine (FLEXERIL) 10 MG tablet TAKE 1 TABLET BY MOUTH 3 (THREE) TIMES DAILY AS NEEDED FOR MUSCLE SPASMS. 04/02/21   Gildardo Pounds, NP  losartan (COZAAR) 25 MG tablet TAKE 1 TABLET (25 MG TOTAL) BY MOUTH  DAILY. 07/01/21   Gildardo Pounds, NP  nystatin cream (MYCOSTATIN) Apply 1 application topically 2 (two) times daily. To breast area and groin 03/31/21   Gildardo Pounds, NP  triamcinolone (KENALOG) 0.025 % ointment APPLY 1 APPLICATION TOPICALLY 2 (TWO) TIMES DAILY. 03/31/21 03/31/22  Gildardo Pounds, NP     Family History  Problem Relation Age of Onset   Hypertension Mother    Heart disease Mother    Diabetes Brother    Diabetes Maternal Aunt    Cancer Maternal Aunt        lung    Diabetes Son    Thyroid disease Son    ADD / ADHD Daughter    ADD / ADHD Son    Epilepsy Son     Social History   Socioeconomic History   Marital status: Significant Other    Spouse name: Not on file   Number of children: 3   Years of education: some colle   Highest education level: Not on file  Occupational History   Occupation: TEACHER    Employer: [employer]  Tobacco Use   Smoking status: Never   Smokeless tobacco: Never  Vaping Use   Vaping Use: Never used  Substance and Sexual Activity   Alcohol use: No   Drug use: No   Sexual activity: Yes    Birth control/protection: None  Other Topics Concern   Not on file  Social History Narrative  Teaches infants.   Lives with 3 children and fiance.    Social Determinants of Health   Financial Resource Strain: Not on file  Food Insecurity: Not on file  Transportation Needs: Not on file  Physical Activity: Not on file  Stress: Not on file  Social Connections: Not on file    Review of Systems: A 12 point ROS discussed and pertinent positives are indicated in the HPI above.  All other systems are negative.   Vital Signs: There were no vitals taken for this visit.  Physical Exam Constitutional:      General: She is not in acute distress. HENT:     Head: Normocephalic.     Mouth/Throat:     Mouth: Mucous membranes are moist.  Cardiovascular:     Rate and Rhythm: Normal rate and regular rhythm.  Pulmonary:     Effort: Pulmonary  effort is normal.     Breath sounds: Normal breath sounds.  Abdominal:     General: There is no distension.  Musculoskeletal:     Right lower leg: No edema.     Left lower leg: No edema.  Skin:    General: Skin is warm and dry.  Neurological:     Mental Status: She is alert and oriented to person, place, and time.    Imaging: US Pelvis 04/09/21 FINDINGS: Uterus Measurements: 17.6 x 9.3 x 11.1 cm = volume: 947 mL. Anteverted. Multiple masses consistent with leiomyomata. Largest of these measure 4.8 cm posteriorly intramural, 5.0 cm anteriorly intramural, and 6.9 cm posterior RIGHT subserosal.  Endometrium Thickness: 10 mm. No endometrial fluid or focal abnormality  Right ovary: Not visualized, likely obscured by bowel and enlarged uterus  Left ovary: Not visualized, likely obscured by bowel and enlarged uterus  Other findings: No free pelvic fluid. No adnexal masses.  IMPRESSION: Nonvisualization of ovaries.  Enlarged uterus containing multiple leiomyomata up to 6.9 cm in greatest size.  Unremarkable endometrial complex.   Labs:  CBC: Recent Labs    08/25/21 1418  WBC 9.7  HGB 11.1  HCT 32.4*  PLT 474*    COAGS: No results for input(s): INR, APTT in the last 8760 hours.  BMP: Recent Labs    10/13/20 1542 03/31/21 1523  NA 140 140  K 4.7 4.3  CL 103 101  CO2 25 24  GLUCOSE 91 88  BUN 7 6  CALCIUM 9.6 9.3  CREATININE 0.85 0.94  GFRNONAA 82  --   GFRAA 95  --     LIVER FUNCTION TESTS: No results for input(s): BILITOT, AST, ALT, ALKPHOS, PROT, ALBUMIN in the last 8760 hours.  TUMOR MARKERS: No results for input(s): AFPTM, CEA, CA199, CHROMGRNA in the last 8760 hours.   Assessment: DEMA TIMMONS is a 45 y.o. female presenting to IR clinic for discussion of symptomatic uterine fibroids.  Her primary symptoms are menorrhagia, and she has previously tried nothing.  The patient was counseled on options for treatment of uterine fibroids with their  accompanying symptoms of heavy menses, painful periods, and/or bulk symptoms including doing nothing, hormonal therapy, myomectomy, hysterectomy, and uterine artery embolization.  We discussed uterine artery embolization ([place]) as a potential therapy.  We described the procedure itself, including the possibility of using analgesia for pain control, a bladder catheter, and admission to the hospital for a 23 hour inpatient observation period.  The procedure is done under conscious sedation.  We quoted an efficacy of 90% for improvement of menorrhagia and of 75% for improvement  of bulk symptoms at one year related to fibroids.  We informed the patient that approximately 80% of those patients described sustained benefits from the procedure at 5 years.  We discussed secondary hysterectomy rates for persistent symptoms of 3%, 4%, 10%, and 17% at 1, 2, 3, and 5 years, respectively.  IF the patient also had adenomyosis as a potential contributing cause for her symptoms, we quoted a higher rate of recurrent symptoms after [place], being 30-50% at 3-5 years.    We also quoted a 1 in 250-500 (0.2-0.4%) incidence of requiring emergent or semi-emergent hysterectomy due a procedural complication, as well an as approximately 7% chance of early onset menopause, mostly in women >44 years of age, with a less than 1% risk for women less than 83 years of age.  We discussed the need to evaluate the ovarian arteries and in some cases the need to treat the fibroids via the ovarian arteries which can lead to a higher risk of early menopause.  We also discussed the risk of an occult malignancy in patients with symptomatic fibroids being anywhere from 1 in 350 to 1 in 8,000 (0.0125-0.3%), depending upon the patient's age, symptom complex, family risk factors, and appearance of the fibroid on her MRI study.  We also briefly discussed surgical options of treatment offered by our Gynecology colleagues with hysterectomy, myomectomy and/or  endometrial ablation.    After her visit today she is most interested in uterine artery embolization.   Plan: Obtain MRI pelvis with contrast. Plan to tentatively proceed with uterine artery [facility] at [facility].   Sedation plan:  Moderate Planned access site:  Left radial artery Contrast premedication: Not required Labs prior to procedure: None Post-procedure observation plan: Same day discharge home Medications to be held prior to procedure: None Immediately prior to procedure: Ancef 2 g IV, decadron 10 mg IV, Zofran 8 mg IV, Oxycodone 10 mg ER PO, Tylenol 1 g PO, insert Foley catheter      Thank you for this interesting consult.  I greatly enjoyed meeting BARRI NEIDLINGER and look forward to participating in their care.  A copy of this report was sent to the requesting provider on this date.  Electronically Signed: Suzette Battiest, MD 09/22/2021, 10:51 AM   I spent a total of  40 Minutes  in face to face in clinical consultation, greater than 50% of which was counseling/coordinating care for symptomatic uterine fibroids.

## 2021-09-23 ENCOUNTER — Encounter: Payer: Self-pay | Admitting: *Deleted

## 2021-09-23 ENCOUNTER — Ambulatory Visit: Payer: 59 | Admitting: Physician Assistant

## 2021-09-24 ENCOUNTER — Other Ambulatory Visit: Payer: Self-pay | Admitting: Interventional Radiology

## 2021-09-24 DIAGNOSIS — D259 Leiomyoma of uterus, unspecified: Secondary | ICD-10-CM

## 2021-09-25 ENCOUNTER — Ambulatory Visit (HOSPITAL_COMMUNITY)
Admission: RE | Admit: 2021-09-25 | Discharge: 2021-09-25 | Disposition: A | Discharge status: Home / Self Care | Payer: 59 | Source: Ambulatory Visit | Attending: Interventional Radiology | Admitting: Interventional Radiology

## 2021-09-25 DIAGNOSIS — D259 Leiomyoma of uterus, unspecified: Secondary | ICD-10-CM | POA: Diagnosis present

## 2021-09-25 MED ORDER — GADOBUTROL 1 MMOL/ML IV SOLN
10.0000 mL | Freq: Once | INTRAVENOUS | Status: AC | PRN
  Administered 2021-09-25: 10 mL via INTRAVENOUS

## 2021-09-30 ENCOUNTER — Ambulatory Visit: Payer: 59 | Admitting: Physician Assistant

## 2021-10-06 ENCOUNTER — Other Ambulatory Visit: Payer: Self-pay

## 2021-10-06 ENCOUNTER — Encounter: Payer: Self-pay | Admitting: Gastroenterology

## 2021-10-06 ENCOUNTER — Ambulatory Visit (INDEPENDENT_AMBULATORY_CARE_PROVIDER_SITE_OTHER): Payer: 59 | Admitting: Gastroenterology

## 2021-10-06 VITALS — BP 128/84 | HR 80 | Ht 63.0 in | Wt 237.1 lb

## 2021-10-06 DIAGNOSIS — R195 Other fecal abnormalities: Secondary | ICD-10-CM

## 2021-10-06 DIAGNOSIS — K921 Melena: Secondary | ICD-10-CM | POA: Diagnosis not present

## 2021-10-06 DIAGNOSIS — Z1211 Encounter for screening for malignant neoplasm of colon: Secondary | ICD-10-CM

## 2021-10-06 DIAGNOSIS — K5900 Constipation, unspecified: Secondary | ICD-10-CM | POA: Diagnosis not present

## 2021-10-06 MED ORDER — SUTAB 1479-225-188 MG PO TABS
1.0000 | ORAL_TABLET | Freq: Once | ORAL | 0 refills | Status: AC
  Filled 2021-10-06: qty 24, 1d supply, fill #0

## 2021-10-06 NOTE — Patient Instructions (Signed)
If you are age 47 or older, your body mass index should be between 23-30. Your Body mass index is 42 kg/m. If this is out of the aforementioned range listed, please consider follow up with your Primary Care Provider.  If you are age 51 or younger, your body mass index should be between 19-25. Your Body mass index is 42 kg/m. If this is out of the aformentioned range listed, please consider follow up with your Primary Care Provider.   You have been scheduled for a colonoscopy. Please follow written instructions given to you at your visit today.  Please pick up your prep supplies at the pharmacy within the next 1-3 days. If you use inhalers (even only as needed), please bring them with you on the day of your procedure.  The [place] [facility] providers would like to encourage you to use [facility] to communicate with providers for non-urgent requests or questions.  Due to long hold times on the telephone, sending your provider a message by [facility] may be a faster and more efficient way to get a response.  Please allow 48 business hours for a response.  Please remember that this is for non-urgent requests.   It was a pleasure to see you today!  Thank you for trusting me with your gastrointestinal care!    Scott E. Candis Schatz, MD

## 2021-10-06 NOTE — Progress Notes (Signed)
HPI : 45 year old female with history of morbid obesity and hypertension referred to [place] by [name], NP for a colonoscopy.  She has never had a colonoscopy before.  She started seeing bright red blood in her stool and on the toilet paper in 2019.  This was also noted in the setting of constipation.  A FIT test was done in April of 2020 and was positive, but patient states that because of COVID, she was unable to get scheduled for a colonoscopy.  Since then, she has increased her dietary fiber and also started taking metamucil and this helped quite a bit with her constipation.  She used to have a bowel movement only about once every 4 days or so, with straining and passage of hard stools.  Now she has a bowel movement about every other day, without straining  Since her constipation has improved, she has also noted improvement in her hematochezia.  The last time she saw any blood was several months ago.  She denies any family history of colon cancer.  Other than constipation and painless hematochezia, she denies any other chronic GI symptoms such as abdominal pain, diarrhea, heartburn/acid regurgitation, nausea/vomiting, dysphagia.   Past Medical History:  Diagnosis Date   Hypertension Dx 2006   previously, on lisinopril. stopped lisinopril when medicaid ran out in 2007.    Morbid obesity ([place])    Prediabetes      Past Surgical History:  Procedure Laterality Date   CESAREAN SECTION     x 3, 1998,2000, 2006   DILATION AND CURETTAGE, DIAGNOSTIC / THERAPEUTIC     due to miscarriage   FOOT SURGERY Left 04/2021   Bone spur   IR RADIOLOGIST EVAL & MGMT  09/22/2021   TUBAL LIGATION  12/05/2004   Family History  Problem Relation Age of Onset   Hypertension Mother    Heart disease Mother    Sleep apnea Mother    Depression Mother    Hypertension Father    Hypertension Sister    Anxiety disorder Sister    Depression Sister    Hypertension Sister    Anxiety disorder Sister     Depression Sister    Diabetes Brother    Hypertension Brother    Anxiety disorder Brother    Depression Brother    Diabetes Brother    Hypertension Brother    Depression Brother    Hypertension Maternal Grandmother    ADD / ADHD Daughter    Depression Daughter    Diabetes Son    Thyroid disease Son    ADD / ADHD Son    Epilepsy Son    Autism Son    Diabetes Maternal Aunt    Cancer Maternal Aunt        lung    Heart defect Nephew    Social History   Tobacco Use   Smoking status: Never   Smokeless tobacco: Never  Vaping Use   Vaping Use: Never used  Substance Use Topics   Alcohol use: No   Drug use: No   Current Outpatient Medications  Medication Sig Dispense Refill   amLODipine (NORVASC) 10 MG tablet TAKE 1 TABLET (10 MG TOTAL) BY MOUTH DAILY 90 tablet 0   cyclobenzaprine (FLEXERIL) 10 MG tablet TAKE 1 TABLET BY MOUTH 3 (THREE) TIMES DAILY AS NEEDED FOR MUSCLE SPASMS. 60 tablet 2   losartan (COZAAR) 25 MG tablet TAKE 1 TABLET (25 MG TOTAL) BY MOUTH DAILY. 90 tablet 0   nystatin cream (  MYCOSTATIN) Apply 1 application topically 2 (two) times daily. To breast area and groin 60 g 1   triamcinolone (KENALOG) 0.025 % ointment APPLY 1 APPLICATION TOPICALLY 2 (TWO) TIMES DAILY. 60 g 2   No current facility-administered medications for this visit.   No Known Allergies   Review of Systems: All systems reviewed and negative except where noted in HPI.    MR PELVIS W WO CONTRAST  Result Date: 09/27/2021 CLINICAL DATA:  Uterine leiomyomas. EXAM: MRI PELVIS WITHOUT AND WITH CONTRAST TECHNIQUE: Multiplanar multisequence MR imaging of the pelvis was performed both before and after administration of intravenous contrast. CONTRAST:  90mL GADAVIST GADOBUTROL 1 MMOL/ML IV SOLN COMPARISON:  Ultrasound Apr 09, 2021 FINDINGS: Urinary Tract: Unremarkable urinary bladder. Bowel: Unremarkable pelvic bowel loops. Vascular/Lymphatic: No pathologically enlarged lymph nodes or other significant  abnormality. Reproductive: Uterus: Measures 15.8 x 11.3 x 10.7 cm. Numerous subserosal and intramural uterine leiomyomas, no submucosal myomas visualized. For reference: A subserosal leiomyoma which extends anteriorly from the lower uterine segment measuring 5.4 x 4.2 x 3.9 cm on image 45/9 and 26/7. On coronal image 34/5 the vascular pedicle seen extending into this leiomyoma. The largest leiomyoma is a intramural myoma in the uterine fundus which measures 6.2 x 5.1 x 5.1 cm gall which extends to and effaces the endometrium. Right ovary: Appears normal. No mass identified. Left ovary:  Appears normal. No mass identified. Other: No significant pelvic free fluid. Musculoskeletal:  Unremarkable. IMPRESSION: 1. Enlarged leiomyomatous uterus including a pedunculated subserosal fibroid measuring 5.4 cm extending from the lower uterine segment. 2. Normal ovaries. Electronically Signed   By: Dahlia Bailiff M.D.   On: 09/27/2021 09:58   IR Radiologist Eval & Mgmt  Result Date: 09/23/2021 Please refer to notes tab for details about interventional procedure. (Op Note)   Physical Exam: Ht 5\' 3"  (1.6 m)   Wt 237 lb 2 oz (107.6 kg)   BMI 42.00 kg/m  Constitutional: Pleasant,well-developed, African-American female in no acute distress. HEENT: Normocephalic and atraumatic. Conjunctivae are normal. No scleral icterus. Cardiovascular: Normal rate, regular rhythm.  Pulmonary/chest: Effort normal and breath sounds normal. No wheezing, rales or rhonchi. Abdominal: Soft, nondistended, nontender. Bowel sounds active throughout. There are no masses palpable. No hepatomegaly. Extremities: no edema Neurological: Alert and oriented to person place and time. Skin: Skin is warm and dry. No rashes noted. Psychiatric: Normal mood and affect. Behavior is normal.  CBC    Component Value Date/Time   WBC 9.7 08/25/2021 1418   WBC 8.2 08/28/2015 1206   RBC 4.50 08/25/2021 1418   RBC 4.83 08/28/2015 1206   HGB 11.1  08/25/2021 1418   HCT 32.4 (L) 08/25/2021 1418   PLT 474 (H) 08/25/2021 1418   MCV 72 (L) 08/25/2021 1418   MCH 24.7 (L) 08/25/2021 1418   MCH 28.4 08/28/2015 1206   MCHC 34.3 08/25/2021 1418   MCHC 34.7 08/28/2015 1206   RDW 16.4 (H) 08/25/2021 1418   LYMPHSABS 3.2 10/04/2010 2142   MONOABS 1.1 (H) 10/04/2010 2142   EOSABS 0.3 10/04/2010 2142   BASOSABS 0.0 10/04/2010 2142    CMP     Component Value Date/Time   NA 140 03/31/2021 1523   K 4.3 03/31/2021 1523   CL 101 03/31/2021 1523   CO2 24 03/31/2021 1523   GLUCOSE 88 03/31/2021 1523   GLUCOSE 97 09/09/2014 1719   GLUCOSE 94 08/29/2014 0932   BUN 6 03/31/2021 1523   CREATININE 0.94 03/31/2021 1523   CREATININE 0.89 09/09/2014  1719   CALCIUM 9.3 03/31/2021 1523   PROT 7.7 02/27/2020 1025   ALBUMIN 4.5 02/27/2020 1025   AST 16 02/27/2020 1025   ALT 13 02/27/2020 1025   ALKPHOS 90 02/27/2020 1025   BILITOT 0.3 02/27/2020 1025   GFRNONAA 82 10/13/2020 1542   GFRNONAA 81 09/09/2014 1719   GFRAA 95 10/13/2020 1542   GFRAA >89 09/09/2014 1719     ASSESSMENT AND PLAN: 45 year old female with chronic constipation and history of bright red blood per rectum with positive fit test in 2020 overdue for colonoscopy.  Her bright red blood per rectum and positive FIT is most likely due to a benign anorectal outlet source such as hemorrhoids as her hematochezia has resolved with improvement in her constipation. Her constipation has improved with the addition of daily Metamucil and dietary changes.  She has not seen blood in her stool for several months.  I recommend she continue just taking Metamucil indefinitely, and she can increase to twice a day to give her a bowel movement more frequently.  I recommended taking senna as needed if she goes more than 2 days without a bowel movement.  Hematochezia, positive fit test - Colonoscopy  Constipation -Continue daily metamucil, increase as needed, add Senna PRN  The details, risks  (including bleeding, perforation, infection, missed lesions, medication reactions and possible hospitalization or surgery if complications occur), benefits, and alternatives to colonoscopy with possible biopsy and possible polypectomy were discussed with the patient and she consents to proceed.   Tanyah Debruyne E. Candis Schatz, MD [facility]   CC:  Gildardo Pounds, NP

## 2021-10-07 ENCOUNTER — Other Ambulatory Visit: Payer: Self-pay

## 2021-10-07 ENCOUNTER — Other Ambulatory Visit (HOSPITAL_COMMUNITY): Payer: Self-pay | Admitting: Interventional Radiology

## 2021-10-07 DIAGNOSIS — D259 Leiomyoma of uterus, unspecified: Secondary | ICD-10-CM

## 2021-10-08 ENCOUNTER — Other Ambulatory Visit: Payer: Self-pay

## 2021-10-08 ENCOUNTER — Other Ambulatory Visit: Payer: Self-pay | Admitting: Nurse Practitioner

## 2021-10-08 DIAGNOSIS — I10 Essential (primary) hypertension: Secondary | ICD-10-CM

## 2021-10-08 MED ORDER — AMLODIPINE BESYLATE 10 MG PO TABS
ORAL_TABLET | ORAL | 2 refills | Status: DC
  Filled 2021-10-18: qty 30, 30d supply, fill #0

## 2021-10-08 MED ORDER — LOSARTAN POTASSIUM 25 MG PO TABS
ORAL_TABLET | ORAL | 2 refills | Status: DC
  Filled 2021-10-18: qty 30, 30d supply, fill #0

## 2021-10-08 NOTE — Telephone Encounter (Signed)
Pt called and scheduled an appt for 12/15/21. She states that she cannot take off of work due to short staffing and has appts coming up for other things. Please advise.

## 2021-10-08 NOTE — Telephone Encounter (Signed)
Patient called, left VM to return the call to the office to schedule an appt in order to proceed with medication refills.

## 2021-10-08 NOTE — Telephone Encounter (Signed)
Requested medication (s) are due for refill today: Yes  Requested medication (s) are on the active medication list: yes  Last refill:  07/01/21  Future visit scheduled: No  Notes to clinic:  Unable to refill per protocol, appointment needed, Unable to refill per protocol due to failed labs, no updated results. Pt called and left VM to call back to schedule needed appt.        Requested Prescriptions  Pending Prescriptions Disp Refills   amLODipine (NORVASC) 10 MG tablet 90 tablet 0    Sig: TAKE 1 TABLET (10 MG TOTAL) BY MOUTH DAILY     Cardiovascular:  Calcium Channel Blockers Passed - 10/08/2021  6:36 AM      Passed - Last BP in normal range    BP Readings from Last 1 Encounters:  10/06/21 128/84          Passed - Valid encounter within last 6 months    Recent Outpatient Visits           5 months ago Primary hypertension   [place], Jarome Matin, RPH-CPP   6 months ago Encounter for Papanicolaou smear for cervical cancer screening   [place], Vernia Buff, NP   12 months ago Need for influenza vaccination   [place], RPH-CPP   12 months ago Essential hypertension   [place], [place], NP   1 year ago Essential hypertension   [place], Vernia Buff, NP       Future Appointments             In 3 months [place], [place], [place] [facility], [facility]             losartan (COZAAR) 25 MG tablet 90 tablet 0    Sig: TAKE 1 TABLET (25 MG TOTAL) BY MOUTH DAILY.     Cardiovascular:  Angiotensin Receptor Blockers Failed - 10/08/2021  6:36 AM      Failed - Cr in normal range and within 180 days    Creat  Date Value Ref Range Status  09/09/2014 0.89 0.50 - 1.10 mg/dL Final   Creatinine, Ser  Date Value Ref Range Status  03/31/2021 0.94 0.57 -  1.00 mg/dL Final          Failed - K in normal range and within 180 days    Potassium  Date Value Ref Range Status  03/31/2021 4.3 3.5 - 5.2 mmol/L Final          Passed - Patient is not pregnant      Passed - Last BP in normal range    BP Readings from Last 1 Encounters:  10/06/21 128/84          Passed - Valid encounter within last 6 months    Recent Outpatient Visits           5 months ago Primary hypertension   [place], Jarome Matin, RPH-CPP   6 months ago Encounter for Papanicolaou smear for cervical cancer screening   [place], Vernia Buff, NP   12 months ago Need for influenza vaccination   [place], RPH-CPP   12 months ago Essential hypertension   [place], [place]  J, NP   1 year ago Essential hypertension   [place], Vernia Buff, NP       Future Appointments             In 3 months [place], Ronalee Red, [place] [facility], [facility]

## 2021-10-10 ENCOUNTER — Encounter: Payer: Self-pay | Admitting: Gastroenterology

## 2021-10-18 ENCOUNTER — Other Ambulatory Visit: Payer: Self-pay

## 2021-11-02 ENCOUNTER — Other Ambulatory Visit: Payer: Self-pay | Admitting: Internal Medicine

## 2021-11-03 ENCOUNTER — Other Ambulatory Visit: Payer: Self-pay | Admitting: Student

## 2021-11-03 ENCOUNTER — Ambulatory Visit (HOSPITAL_COMMUNITY)
Admission: RE | Admit: 2021-11-03 | Discharge: 2021-11-03 | Disposition: A | Discharge status: Home / Self Care | Payer: 59 | Source: Ambulatory Visit | Attending: Interventional Radiology | Admitting: Interventional Radiology

## 2021-11-03 ENCOUNTER — Encounter (HOSPITAL_COMMUNITY): Payer: Self-pay

## 2021-11-03 ENCOUNTER — Other Ambulatory Visit: Payer: Self-pay

## 2021-11-03 ENCOUNTER — Other Ambulatory Visit (HOSPITAL_COMMUNITY): Payer: Self-pay | Admitting: Interventional Radiology

## 2021-11-03 DIAGNOSIS — D259 Leiomyoma of uterus, unspecified: Secondary | ICD-10-CM

## 2021-11-03 DIAGNOSIS — I10 Essential (primary) hypertension: Secondary | ICD-10-CM | POA: Insufficient documentation

## 2021-11-03 HISTORY — PX: IR EMBO TUMOR ORGAN ISCHEMIA INFARCT INC GUIDE ROADMAPPING: IMG5449

## 2021-11-03 HISTORY — PX: IR ANGIOGRAM PELVIS SELECTIVE OR SUPRASELECTIVE: IMG661

## 2021-11-03 HISTORY — PX: IR ANGIOGRAM SELECTIVE EACH ADDITIONAL VESSEL: IMG667

## 2021-11-03 HISTORY — PX: IR US GUIDE VASC ACCESS LEFT: IMG2389

## 2021-11-03 LAB — CBC WITH DIFFERENTIAL/PLATELET
Abs Immature Granulocytes: 0.01 10*3/uL (ref 0.00–0.07)
Basophils Absolute: 0 10*3/uL (ref 0.0–0.1)
Basophils Relative: 1 %
Eosinophils Absolute: 0.2 10*3/uL (ref 0.0–0.5)
Eosinophils Relative: 3 %
HCT: 32.9 % — ABNORMAL LOW (ref 36.0–46.0)
Hemoglobin: 11 g/dL — ABNORMAL LOW (ref 12.0–15.0)
Immature Granulocytes: 0 %
Lymphocytes Relative: 30 %
Lymphs Abs: 2.1 10*3/uL (ref 0.7–4.0)
MCH: 23.5 pg — ABNORMAL LOW (ref 26.0–34.0)
MCHC: 33.4 g/dL (ref 30.0–36.0)
MCV: 70.1 fL — ABNORMAL LOW (ref 80.0–100.0)
Monocytes Absolute: 0.6 10*3/uL (ref 0.1–1.0)
Monocytes Relative: 9 %
Neutro Abs: 4 10*3/uL (ref 1.7–7.7)
Neutrophils Relative %: 57 %
Platelets: 455 10*3/uL — ABNORMAL HIGH (ref 150–400)
RBC: 4.69 MIL/uL (ref 3.87–5.11)
RDW: 16.8 % — ABNORMAL HIGH (ref 11.5–15.5)
WBC: 6.9 10*3/uL (ref 4.0–10.5)
nRBC: 0 % (ref 0.0–0.2)

## 2021-11-03 LAB — HCG, SERUM, QUALITATIVE: Preg, Serum: NEGATIVE

## 2021-11-03 LAB — PROTIME-INR
INR: 1 (ref 0.8–1.2)
Prothrombin Time: 12.9 seconds (ref 11.4–15.2)

## 2021-11-03 LAB — BASIC METABOLIC PANEL
Anion gap: 7 (ref 5–15)
BUN: 10 mg/dL (ref 6–20)
CO2: 24 mmol/L (ref 22–32)
Calcium: 8.8 mg/dL — ABNORMAL LOW (ref 8.9–10.3)
Chloride: 105 mmol/L (ref 98–111)
Creatinine, Ser: 0.86 mg/dL (ref 0.44–1.00)
GFR, Estimated: >60 mL/min (ref >60)
Glucose, Bld: 104 mg/dL — ABNORMAL HIGH (ref 70–99)
Potassium: 4.1 mmol/L (ref 3.5–5.1)
Sodium: 136 mmol/L (ref 135–145)

## 2021-11-03 MED ORDER — PROMETHAZINE HCL 12.5 MG PO TABS: 12.5000 mg | ORAL_TABLET | ORAL | 0 refills | Status: DC | PRN

## 2021-11-03 MED ORDER — LIDOCAINE 4 % EX CREA
TOPICAL_CREAM | Freq: Once | CUTANEOUS | Status: AC
  Filled 2021-11-03: qty 5

## 2021-11-03 MED ORDER — IOHEXOL 300 MG/ML  SOLN: 100.0000 mL | Freq: Once | INTRAMUSCULAR | Status: DC | PRN

## 2021-11-03 MED ORDER — LIDOCAINE HCL (PF) 1 % IJ SOLN
INTRAMUSCULAR | Status: AC | PRN
  Administered 2021-11-03: 5 mL

## 2021-11-03 MED ORDER — VERAPAMIL HCL 2.5 MG/ML IV SOLN: INTRA_ARTERIAL | Status: AC | PRN

## 2021-11-03 MED ORDER — ACETAMINOPHEN 500 MG PO TABS
1000.0000 mg | ORAL_TABLET | Freq: Once | ORAL | Status: AC
  Administered 2021-11-03: 1000 mg via ORAL
  Filled 2021-11-03: qty 2

## 2021-11-03 MED ORDER — MIDAZOLAM HCL 2 MG/2ML IJ SOLN
INTRAMUSCULAR | Status: AC
  Filled 2021-11-03: qty 4

## 2021-11-03 MED ORDER — KETOROLAC TROMETHAMINE 10 MG PO TABS
10.0000 mg | ORAL_TABLET | Freq: Four times a day (QID) | ORAL | 0 refills | Status: DC
  Filled 2021-11-03: qty 12, 3d supply, fill #0

## 2021-11-03 MED ORDER — IBUPROFEN 200 MG PO TABS: 800.0000 mg | ORAL_TABLET | Freq: Three times a day (TID) | ORAL | 0 refills | Status: AC

## 2021-11-03 MED ORDER — HYDROMORPHONE HCL 1 MG/ML IJ SOLN
INTRAMUSCULAR | Status: AC
  Filled 2021-11-03: qty 1

## 2021-11-03 MED ORDER — OXYCODONE HCL 5 MG PO TABS
10.0000 mg | ORAL_TABLET | ORAL | Status: DC | PRN
  Administered 2021-11-03: 10 mg via ORAL
  Filled 2021-11-03: qty 2

## 2021-11-03 MED ORDER — ONDANSETRON HCL 4 MG/2ML IJ SOLN: 4.0000 mg | Freq: Four times a day (QID) | INTRAMUSCULAR | Status: DC | PRN

## 2021-11-03 MED ORDER — DEXAMETHASONE SODIUM PHOSPHATE 10 MG/ML IJ SOLN
10.0000 mg | Freq: Once | INTRAMUSCULAR | Status: AC
  Administered 2021-11-03: 10 mg via INTRAVENOUS
  Filled 2021-11-03: qty 1

## 2021-11-03 MED ORDER — OXYCODONE HCL 5 MG PO TABS: 5.0000 mg | ORAL_TABLET | ORAL | Status: DC | PRN

## 2021-11-03 MED ORDER — MIDAZOLAM HCL 2 MG/2ML IJ SOLN
INTRAMUSCULAR | Status: AC | PRN
  Administered 2021-11-03 (×4): 1 mg via INTRAVENOUS

## 2021-11-03 MED ORDER — OXYCODONE-ACETAMINOPHEN 7.5-325 MG PO TABS: 1.0000 | ORAL_TABLET | ORAL | 0 refills | Status: DC | PRN

## 2021-11-03 MED ORDER — PROMETHAZINE HCL 12.5 MG PO TABS
12.5000 mg | ORAL_TABLET | ORAL | 0 refills | Status: DC | PRN
  Filled 2021-11-03: qty 30, 5d supply, fill #0

## 2021-11-03 MED ORDER — SODIUM CHLORIDE 0.9 % IV SOLN: INTRAVENOUS | Status: DC

## 2021-11-03 MED ORDER — KETOROLAC TROMETHAMINE 10 MG PO TABS: 10.0000 mg | ORAL_TABLET | Freq: Four times a day (QID) | ORAL | 0 refills | Status: AC

## 2021-11-03 MED ORDER — FENTANYL CITRATE (PF) 100 MCG/2ML IJ SOLN
INTRAMUSCULAR | Status: AC
  Filled 2021-11-03: qty 2

## 2021-11-03 MED ORDER — IBUPROFEN 200 MG PO TABS
800.0000 mg | ORAL_TABLET | Freq: Three times a day (TID) | ORAL | 0 refills | Status: DC
Start: 2021-11-03 — End: 2021-11-03
  Filled 2021-11-03: qty 30, 3d supply, fill #0

## 2021-11-03 MED ORDER — NITROGLYCERIN IN D5W 100-5 MCG/ML-% IV SOLN
INTRAVENOUS | Status: AC
  Filled 2021-11-03: qty 250

## 2021-11-03 MED ORDER — DOCUSATE SODIUM 100 MG PO CAPS: 100.0000 mg | ORAL_CAPSULE | Freq: Two times a day (BID) | ORAL | 0 refills | Status: DC

## 2021-11-03 MED ORDER — OXYCODONE HCL 5 MG PO TABS
10.0000 mg | ORAL_TABLET | Freq: Once | ORAL | Status: AC
  Administered 2021-11-03: 10 mg via ORAL
  Filled 2021-11-03: qty 2

## 2021-11-03 MED ORDER — OXYCODONE-ACETAMINOPHEN 7.5-325 MG PO TABS
1.0000 | ORAL_TABLET | ORAL | 0 refills | Status: DC | PRN
  Filled 2021-11-03: qty 30, 5d supply, fill #0

## 2021-11-03 MED ORDER — NITROGLYCERIN 2 % TD OINT
2.0000 [in_us] | TOPICAL_OINTMENT | TRANSDERMAL | Status: AC
  Administered 2021-11-03: 2 [in_us] via TOPICAL
  Filled 2021-11-03: qty 30

## 2021-11-03 MED ORDER — HEPARIN SODIUM (PORCINE) 1000 UNIT/ML IJ SOLN
INTRAMUSCULAR | Status: AC
  Filled 2021-11-03: qty 10

## 2021-11-03 MED ORDER — HYDROMORPHONE HCL 1 MG/ML IJ SOLN
INTRAMUSCULAR | Status: AC | PRN
  Administered 2021-11-03: .5 mg via INTRAVENOUS

## 2021-11-03 MED ORDER — CEFAZOLIN SODIUM-DEXTROSE 2-4 GM/100ML-% IV SOLN
INTRAVENOUS | Status: AC
  Administered 2021-11-03: 2 g via INTRAVENOUS
  Filled 2021-11-03: qty 100

## 2021-11-03 MED ORDER — KETOROLAC TROMETHAMINE 30 MG/ML IJ SOLN
INTRAMUSCULAR | Status: AC
  Filled 2021-11-03: qty 2

## 2021-11-03 MED ORDER — FENTANYL CITRATE (PF) 100 MCG/2ML IJ SOLN
INTRAMUSCULAR | Status: AC | PRN
  Administered 2021-11-03 (×2): 50 ug via INTRAVENOUS

## 2021-11-03 MED ORDER — DOCUSATE SODIUM 100 MG PO CAPS
100.0000 mg | ORAL_CAPSULE | Freq: Two times a day (BID) | ORAL | 0 refills | Status: DC
  Filled 2021-11-03: qty 30, 15d supply, fill #0

## 2021-11-03 MED ORDER — SODIUM CHLORIDE 0.9 % IV SOLN
8.0000 mg | Freq: Once | INTRAVENOUS | Status: AC
  Administered 2021-11-03: 8 mg via INTRAVENOUS
  Filled 2021-11-03: qty 8

## 2021-11-03 MED ORDER — CEFAZOLIN SODIUM-DEXTROSE 2-4 GM/100ML-% IV SOLN: 2.0000 g | INTRAVENOUS | Status: AC

## 2021-11-03 MED ORDER — LIDOCAINE HCL (PF) 1 % IJ SOLN
INTRAMUSCULAR | Status: AC
  Filled 2021-11-03: qty 30

## 2021-11-03 MED ORDER — KETOROLAC TROMETHAMINE 30 MG/ML IJ SOLN
INTRAMUSCULAR | Status: AC | PRN
  Administered 2021-11-03: 30 mg via INTRAMUSCULAR
  Administered 2021-11-03: 30 mg via INTRAVENOUS

## 2021-11-03 MED ORDER — ONDANSETRON HCL 4 MG/2ML IJ SOLN: 8.0000 mg | Freq: Once | INTRAMUSCULAR | Status: DC

## 2021-11-03 MED ORDER — VERAPAMIL HCL 2.5 MG/ML IV SOLN
INTRAVENOUS | Status: AC
  Filled 2021-11-03: qty 2

## 2021-11-03 NOTE — H&P (Signed)
Chief Complaint: Patient was seen in consultation today for bilateral uterine artery angiogram with embolization at the request of Megan Salon, MD  Referring Physician(s): Megan Salon, MD  Supervising Physician: Ruthann Cancer  Patient Status: [facility] - Out-pt  History of Present Illness: Monica Brennan is a 45 y.o. female with PMHs of HTN and symptomatic uterine fibroids who was referred to Dr. Serafina Royals to discuss treatment options for the fibroids. Patient had a consultation visit with Dr. Serafina Royals on 09/22/2021 when a MRI of pelvis with contrast for further evaluation was recommended.  She underwent MRI on 09/27/2021 which showed no other anormally then the uterine fibroids. Therefore, patient was deemed a good candidate for bilateral uterine artery angiogram with embolization.  After thorough discussion and shared decision making with Dr. Serafina Royals, patient decided to proceed with the procedure.  Patient presents to [facility] IR today for the procedure. Patient laying in bed, not in acute distress.  Denise headache, fever, chills, shortness of breath, cough, chest pain, abdominal pain, nausea ,vomiting, and bleeding. Patient LMP was on 10/26/2021, she denies possible pregnancy.   Past Medical History:  Diagnosis Date   Hypertension Dx 2006   previously, on lisinopril. stopped lisinopril when medicaid ran out in 2007.    Morbid obesity ([place])    Prediabetes     Past Surgical History:  Procedure Laterality Date   CESAREAN SECTION     x 3, 1998,2000, 2006   DILATION AND CURETTAGE, DIAGNOSTIC / THERAPEUTIC     due to miscarriage   FOOT SURGERY Left 04/2021   Bone spur   IR RADIOLOGIST EVAL & MGMT  09/22/2021   TUBAL LIGATION  12/05/2004    Allergies: Patient has no known allergies.  Medications: Prior to Admission medications   Medication Sig Start Date End Date Taking? Authorizing Provider  amLODipine (NORVASC) 10 MG tablet TAKE 1 TABLET (10 MG TOTAL) BY MOUTH DAILY 10/08/21   Yes Newlin, Enobong, MD  cyclobenzaprine (FLEXERIL) 10 MG tablet TAKE 1 TABLET BY MOUTH 3 (THREE) TIMES DAILY AS NEEDED FOR MUSCLE SPASMS. 04/02/21  Yes Gildardo Pounds, NP  nystatin cream (MYCOSTATIN) Apply 1 application topically 2 (two) times daily. To breast area and groin 03/31/21  Yes Gildardo Pounds, NP  triamcinolone (KENALOG) 0.025 % ointment APPLY 1 APPLICATION TOPICALLY 2 (TWO) TIMES DAILY. 03/31/21 03/31/22 Yes Gildardo Pounds, NP  losartan (COZAAR) 25 MG tablet TAKE 1 TABLET (25 MG TOTAL) BY MOUTH DAILY. 10/08/21   Charlott Rakes, MD     Family History  Problem Relation Age of Onset   Hypertension Mother    Heart disease Mother    Sleep apnea Mother    Depression Mother    Hypertension Father    Hypertension Sister    Anxiety disorder Sister    Depression Sister    Hypertension Sister    Anxiety disorder Sister    Depression Sister    Diabetes Brother    Hypertension Brother    Anxiety disorder Brother    Depression Brother    Diabetes Brother    Hypertension Brother    Depression Brother    Hypertension Maternal Grandmother    ADD / ADHD Daughter    Depression Daughter    Diabetes Son    Thyroid disease Son    ADD / ADHD Son    Epilepsy Son    Autism Son    Diabetes Maternal Aunt    Cancer Maternal Aunt  lung    Heart defect Nephew     Social History   Socioeconomic History   Marital status: Significant Other    Spouse name: Not on file   Number of children: 3   Years of education: some colle   Highest education level: Not on file  Occupational History   Occupation: TEACHER    Employer: [employer]  Tobacco Use   Smoking status: Never   Smokeless tobacco: Never  Vaping Use   Vaping Use: Never used  Substance and Sexual Activity   Alcohol use: No   Drug use: No   Sexual activity: Yes    Birth control/protection: None  Other Topics Concern   Not on file  Social History Narrative   Teaches infants.   Lives with 3 children and fiance.     Social Determinants of Health   Financial Resource Strain: Not on file  Food Insecurity: Not on file  Transportation Needs: Not on file  Physical Activity: Not on file  Stress: Not on file  Social Connections: Not on file     Review of Systems: A 12 point ROS discussed and pertinent positives are indicated in the HPI above.  All other systems are negative.  Vital Signs: BP 112/84   Temp 98.1 F (36.7 C) (Oral)   Resp 18   Ht 5\' 3"  (1.6 m)   Wt 236 lb 9.6 oz (107.3 kg)   LMP 10/26/2021 (Exact Date)   SpO2 99%   BMI 41.91 kg/m    Physical Exam Vitals and nursing note reviewed.  Constitutional:      General: Patient is not in acute distress.    Appearance: Normal appearance. Patient is not ill-appearing.  HENT:     Head: Normocephalic and atraumatic.     Mouth/Throat:     Mouth: Mucous membranes are moist.     Pharynx: Oropharynx is clear.  Cardiovascular:     Rate and Rhythm: Normal rate and regular rhythm.     Pulses: Normal pulses.     Heart sounds: Normal heart sounds.  Pulmonary:     Effort: Pulmonary effort is normal.     Breath sounds: Normal breath sounds.  Abdominal:     General: Abdomen is flat. Bowel sounds are normal.     Palpations: Abdomen is soft.  Musculoskeletal:     Cervical back: Neck supple.  Skin:    General: Skin is warm and dry.     Coloration: Skin is not jaundiced or pale.  Neurological:     Mental Status: Patient is alert and oriented to person, place, and time.  Psychiatric:        Mood and Affect: Mood normal.        Behavior: Behavior normal.        Judgment: Judgment normal.    MD Evaluation Airway: WNL Heart: WNL Abdomen: WNL Chest/ Lungs: WNL ASA  Classification: 2 Mallampati/Airway Score: Two  Imaging: No results found.  Labs:  CBC: Recent Labs    08/25/21 1418 11/03/21 0738  WBC 9.7 6.9  HGB 11.1 11.0*  HCT 32.4* 32.9*  PLT 474* 455*    COAGS: Recent Labs    11/03/21 0738  INR 1.0     BMP: Recent Labs    03/31/21 1523 11/03/21 0738  NA 140 136  K 4.3 4.1  CL 101 105  CO2 24 24  GLUCOSE 88 104*  BUN 6 10  CALCIUM 9.3 8.8*  CREATININE 0.94 0.86  GFRNONAA  --  >60  LIVER FUNCTION TESTS: No results for input(s): BILITOT, AST, ALT, ALKPHOS, PROT, ALBUMIN in the last 8760 hours.  TUMOR MARKERS: No results for input(s): AFPTM, CEA, CA199, CHROMGRNA in the last 8760 hours.  Assessment and Plan: 45 y.o. female with symptomatic uterine fibroids, who was deemed a good candidate for bilateral uterine artery angiogram and embolization by Dr. Serafina Royals.  Patient presents to [facility] IR today for the procedure. N.p.o. since midnight VSS CBC shows Hgb 11.0, PLT 455 but stable since September 2022 INR 1.0 -on AC/AP BMP stable hCG negative  Risks and benefits of procedure were discussed with the patient including, but not limited to bleeding, infection, vascular injury or contrast induced renal failure. All of the patient's questions were answered, patient is agreeable to proceed.   Consent signed and in chart.  Thank you for this interesting consult.  I greatly enjoyed meeting CHRISTMAS FARACI and look forward to participating in their care.  A copy of this report was sent to the requesting provider on this date.  Electronically Signed: Tera Mater, PA-C 11/03/2021, 8:27 AM   I spent a total of    25 Minutes in face to face in clinical consultation, greater than 50% of which was counseling/coordinating care for bilateral uterine artery angiogram with embolization.  This chart was dictated using voice recognition software.  Despite best efforts to proofread,  errors can occur which can change the documentation meaning.

## 2021-11-03 NOTE — Discharge Instructions (Addendum)
Interventional radiology phone numbers [phone] After hours [phone]     Moderate Conscious Sedation, Adult, Care After This sheet gives you information about how to care for yourself after your procedure. Your health care provider may also give you more specific instructions. If you have problems or questions, contact your health care provider. What can I expect after the procedure? After the procedure, it is common to have: Sleepiness for several hours. Impaired judgment for several hours. Difficulty with balance. Vomiting if you eat too soon. Follow these instructions at home: For the time period you were told by your health care provider: Rest. Do not participate in activities where you could fall or become injured. Do not drive or use machinery. Do not drink alcohol. Do not take sleeping pills or medicines that cause drowsiness. Do not make important decisions or sign legal documents. Do not take care of children on your own.      Eating and drinking Follow the diet recommended by your health care provider. Drink enough fluid to keep your urine pale yellow. If you vomit: Drink water, juice, or soup when you can drink without vomiting. Make sure you have little or no nausea before eating solid foods.   General instructions Take over-the-counter and prescription medicines only as told by your health care provider. Have a responsible adult stay with you for the time you are told. It is important to have someone help care for you until you are awake and alert. Do not smoke. Keep all follow-up visits as told by your health care provider. This is important. Contact a health care provider if: You are still sleepy or having trouble with balance after 24 hours. You feel light-headed. You keep feeling nauseous or you keep vomiting. You develop a rash. You have a fever. You have redness or swelling around the IV site. Get help right away if: You have trouble  breathing. You have new-onset confusion at home. Summary After the procedure, it is common to feel sleepy, have impaired judgment, or feel nauseous if you eat too soon. Rest after you get home. Know the things you should not do after the procedure. Follow the diet recommended by your health care provider and drink enough fluid to keep your urine pale yellow. Get help right away if you have trouble breathing or new-onset confusion at home. This information is not intended to replace advice given to you by your health care provider. Make sure you discuss any questions you have with your health care provider. Document Revised: 03/20/2020 Document Reviewed: 10/17/2019 Elsevier Patient Education  2021 [employer].

## 2021-11-03 NOTE — Procedures (Signed)
Interventional Radiology Procedure Note  Procedure: Uterine artery embolization  Findings: Please refer to procedural dictation for full description.  Complications: None immediate  Estimated Blood Loss: < 5 mL  Recommendations: TR band protocol. Plan to discharge home today. Follow up in IR clinic in 1 month.   Ruthann Cancer, MD Pager: [phone]

## 2021-11-15 ENCOUNTER — Other Ambulatory Visit: Payer: Self-pay | Admitting: Interventional Radiology

## 2021-11-15 ENCOUNTER — Encounter: Payer: Self-pay | Admitting: *Deleted

## 2021-11-15 DIAGNOSIS — D259 Leiomyoma of uterus, unspecified: Secondary | ICD-10-CM

## 2021-11-18 ENCOUNTER — Other Ambulatory Visit: Payer: Self-pay

## 2021-11-18 ENCOUNTER — Ambulatory Visit
Admission: RE | Admit: 2021-11-18 | Discharge: 2021-11-18 | Disposition: A | Discharge status: Home / Self Care | Payer: 59 | Source: Ambulatory Visit | Attending: Interventional Radiology | Admitting: Interventional Radiology

## 2021-11-18 ENCOUNTER — Encounter: Payer: Self-pay | Admitting: *Deleted

## 2021-11-18 DIAGNOSIS — D259 Leiomyoma of uterus, unspecified: Secondary | ICD-10-CM

## 2021-11-18 HISTORY — PX: IR RADIOLOGIST EVAL & MGMT: IMG5224

## 2021-11-18 NOTE — Progress Notes (Signed)
Reason for follow up: Short term follow up after [place], virtual telephone visit  History of present illness: Monica Brennan is a 45 y.o. G3P2 female with history of symptomatic uterine fibroids, primarily menorrhagia, now status post bilateral uterine artery embolization on 11/03/21.  She has done well since the procedure with resolution of post-procedural pain.  She has begun her menstrual cycle with some mild spotting and minimal tan discharge.  She has more cramping than usual but significantly less bleeding.  No fevers, chills, nausea, vomiting.  Past Medical History:  Diagnosis Date   Hypertension Dx 2006   previously, on lisinopril. stopped lisinopril when medicaid ran out in 2007.    Morbid obesity ([place])    Prediabetes     Past Surgical History:  Procedure Laterality Date   CESAREAN SECTION     x 3, 1998,2000, 2006   DILATION AND CURETTAGE, DIAGNOSTIC / THERAPEUTIC     due to miscarriage   FOOT SURGERY Left 04/2021   Bone spur   IR ANGIOGRAM PELVIS SELECTIVE OR SUPRASELECTIVE  11/03/2021   IR ANGIOGRAM PELVIS SELECTIVE OR SUPRASELECTIVE  11/03/2021   IR ANGIOGRAM SELECTIVE EACH ADDITIONAL VESSEL  11/03/2021   IR ANGIOGRAM SELECTIVE EACH ADDITIONAL VESSEL  11/03/2021   IR EMBO TUMOR ORGAN ISCHEMIA INFARCT INC GUIDE ROADMAPPING  11/03/2021   IR RADIOLOGIST EVAL & MGMT  09/22/2021   IR US GUIDE VASC ACCESS LEFT  11/03/2021   TUBAL LIGATION  12/05/2004    Allergies: Patient has no known allergies.  Medications: Prior to Admission medications   Medication Sig Start Date End Date Taking? Authorizing Provider  docusate sodium (COLACE) 100 MG capsule Take 1 capsule (100 mg total) by mouth 2 (two) times daily. 11/03/21   Han, Aimee H, PA-C  oxyCODONE-acetaminophen (PERCOCET) 7.5-325 MG tablet Take 1 tablet by mouth every 4 (four) hours as needed for severe pain. 11/03/21   Han, Aimee H, PA-C  promethazine (PHENERGAN) 12.5 MG tablet Take 1 tablet (12.5 mg total) by mouth  every 4 (four) hours as needed for nausea or vomiting. Take this with Percocet (oxycodone and acetaminophen) to prevent constipation. 11/03/21   Han, Aimee H, PA-C  amLODipine (NORVASC) 10 MG tablet TAKE 1 TABLET (10 MG TOTAL) BY MOUTH DAILY 10/08/21   Charlott Rakes, MD  cyclobenzaprine (FLEXERIL) 10 MG tablet TAKE 1 TABLET BY MOUTH 3 (THREE) TIMES DAILY AS NEEDED FOR MUSCLE SPASMS. 04/02/21   Gildardo Pounds, NP  losartan (COZAAR) 25 MG tablet TAKE 1 TABLET (25 MG TOTAL) BY MOUTH DAILY. 10/08/21   Charlott Rakes, MD  nystatin cream (MYCOSTATIN) Apply 1 application topically 2 (two) times daily. To breast area and groin 03/31/21   Gildardo Pounds, NP  triamcinolone (KENALOG) 0.025 % ointment APPLY 1 APPLICATION TOPICALLY 2 (TWO) TIMES DAILY. 03/31/21 03/31/22  Gildardo Pounds, NP     Family History  Problem Relation Age of Onset   Hypertension Mother    Heart disease Mother    Sleep apnea Mother    Depression Mother    Hypertension Father    Hypertension Sister    Anxiety disorder Sister    Depression Sister    Hypertension Sister    Anxiety disorder Sister    Depression Sister    Diabetes Brother    Hypertension Brother    Anxiety disorder Brother    Depression Brother    Diabetes Brother    Hypertension Brother    Depression Brother    Hypertension Maternal Grandmother  ADD / ADHD Daughter    Depression Daughter    Diabetes Son    Thyroid disease Son    ADD / ADHD Son    Epilepsy Son    Autism Son    Diabetes Maternal Aunt    Cancer Maternal Aunt        lung    Heart defect Nephew     Social History   Socioeconomic History   Marital status: Significant Other    Spouse name: Not on file   Number of children: 3   Years of education: some colle   Highest education level: Not on file  Occupational History   Occupation: TEACHER    Employer: [employer]  Tobacco Use   Smoking status: Never   Smokeless tobacco: Never  Vaping Use   Vaping Use: Never used  Substance  and Sexual Activity   Alcohol use: No   Drug use: No   Sexual activity: Yes    Birth control/protection: None  Other Topics Concern   Not on file  Social History Narrative   Teaches infants.   Lives with 3 children and fiance.    Social Determinants of Health   Financial Resource Strain: Not on file  Food Insecurity: Not on file  Transportation Needs: Not on file  Physical Activity: Not on file  Stress: Not on file  Social Connections: Not on file     Vital Signs: LMP 10/26/2021 (Exact Date)   No physical examination performed in lieu of telephone visit.  Imaging: [place] 11/03/21 Left:   Right:   Post-embolization:    Labs: No pertinent recent labs.  Assessment and Plan: 45 year old female with history of symptomatic (menorrhagia) uterine fibroids status post uterine artery embolization on 11/03/21, recovering well.  Follow up in 3 months, or sooner if needed.   Electronically Signed: Suzette Battiest 11/18/2021, 11:28 AM   I spent a total of 25 Minutes in telephone clinical consultation, greater than 50% of which was counseling/coordinating care for symptomatic uterine fibroids.

## 2021-11-30 ENCOUNTER — Ambulatory Visit (AMBULATORY_SURGERY_CENTER): Payer: 59 | Admitting: Gastroenterology

## 2021-11-30 ENCOUNTER — Encounter: Payer: Self-pay | Admitting: Gastroenterology

## 2021-11-30 VITALS — BP 116/72 | HR 78 | Temp 97.8°F | Resp 14 | Ht 63.0 in | Wt 237.0 lb

## 2021-11-30 DIAGNOSIS — K5900 Constipation, unspecified: Secondary | ICD-10-CM

## 2021-11-30 DIAGNOSIS — R195 Other fecal abnormalities: Secondary | ICD-10-CM

## 2021-11-30 DIAGNOSIS — D128 Benign neoplasm of rectum: Secondary | ICD-10-CM

## 2021-11-30 DIAGNOSIS — K621 Rectal polyp: Secondary | ICD-10-CM

## 2021-11-30 DIAGNOSIS — K921 Melena: Secondary | ICD-10-CM

## 2021-11-30 MED ORDER — SODIUM CHLORIDE 0.9 % IV SOLN: 500.0000 mL | Freq: Once | INTRAVENOUS | Status: DC

## 2021-11-30 NOTE — Patient Instructions (Signed)
Resume previous diet and medications. Awaiting pathology results. Repeat Colonoscopy in 10 years for surveillance.  YOU HAD AN ENDOSCOPIC PROCEDURE TODAY AT [facility]:   Refer to the procedure report that was given to you for any specific questions about what was found during the examination.  If the procedure report does not answer your questions, please call your gastroenterologist to clarify.  If you requested that your care partner not be given the details of your procedure findings, then the procedure report has been included in a sealed envelope for you to review at your convenience later.  YOU SHOULD EXPECT: Some feelings of bloating in the abdomen. Passage of more gas than usual.  Walking can help get rid of the air that was put into your GI tract during the procedure and reduce the bloating. If you had a lower endoscopy (such as a colonoscopy or flexible sigmoidoscopy) you may notice spotting of blood in your stool or on the toilet paper. If you underwent a bowel prep for your procedure, you may not have a normal bowel movement for a few days.  Please Note:  You might notice some irritation and congestion in your nose or some drainage.  This is from the oxygen used during your procedure.  There is no need for concern and it should clear up in a day or so.  SYMPTOMS TO REPORT IMMEDIATELY:  Following lower endoscopy (colonoscopy or flexible sigmoidoscopy):  Excessive amounts of blood in the stool  Significant tenderness or worsening of abdominal pains  Swelling of the abdomen that is new, acute  Fever of 100F or higher  For urgent or emergent issues, a gastroenterologist can be reached at any hour by calling [phone]. Do not use MyChart messaging for urgent concerns.    DIET:  We do recommend a small meal at first, but then you may proceed to your regular diet.  Drink plenty of fluids but you should avoid alcoholic beverages for 24 hours.  ACTIVITY:  You should  plan to take it easy for the rest of today and you should NOT DRIVE or use heavy machinery until tomorrow (because of the sedation medicines used during the test).    FOLLOW UP: Our staff will call the number listed on your records 48-72 hours following your procedure to check on you and address any questions or concerns that you may have regarding the information given to you following your procedure. If we do not reach you, we will leave a message.  We will attempt to reach you two times.  During this call, we will ask if you have developed any symptoms of COVID 19. If you develop any symptoms (ie: fever, flu-like symptoms, shortness of breath, cough etc.) before then, please call [phone].  If you test positive for Covid 19 in the 2 weeks post procedure, please call and report this information to [place].    If any biopsies were taken you will be contacted by phone or by letter within the next 1-3 weeks.  Please call us at [phone] if you have not heard about the biopsies in 3 weeks.    SIGNATURES/CONFIDENTIALITY: You and/or your care partner have signed paperwork which will be entered into your electronic medical record.  These signatures attest to the fact that that the information above on your After Visit Summary has been reviewed and is understood.  Full responsibility of the confidentiality of this discharge information lies with you and/or your care-partner.

## 2021-11-30 NOTE — Progress Notes (Signed)
Report to PACU, RN, vss, BBS= Clear.  

## 2021-11-30 NOTE — Op Note (Signed)
[facility] Patient Name: Monica Brennan Procedure Date: 11/30/2021 8:52 AM MRN: [id number] Endoscopist: Nicki Reaper E. Candis Schatz , MD Age: 45 Referring MD:  Date of Birth: Mar 10, 1976 Gender: Female Account #: [id number] Procedure:                Colonoscopy Indications:              Positive fecal immunochemical test Medicines:                Monitored Anesthesia Care Procedure:                Pre-Anesthesia Assessment:                           - Prior to the procedure, a History and Physical                            was performed, and patient medications and                            allergies were reviewed. The patient's tolerance of                            previous anesthesia was also reviewed. The risks                            and benefits of the procedure and the sedation                            options and risks were discussed with the patient.                            All questions were answered, and informed consent                            was obtained. Prior Anticoagulants: The patient has                            taken no previous anticoagulant or antiplatelet                            agents. ASA Grade Assessment: III - A patient with                            severe systemic disease. After reviewing the risks                            and benefits, the patient was deemed in                            satisfactory condition to undergo the procedure.                           After obtaining informed consent, the colonoscope  was passed under direct vision. Throughout the                            procedure, the patient's blood pressure, pulse, and                            oxygen saturations were monitored continuously. The                            CF [id number] #[id number] was introduced through the anus                            and advanced to the the cecum, identified by                            appendiceal  orifice and ileocecal valve. The                            colonoscopy was performed without difficulty. The                            patient tolerated the procedure well. The quality                            of the bowel preparation was adequate. The                            ileocecal valve, appendiceal orifice, and rectum                            were photographed. Scope In: 8:57:19 AM Scope Out: 9:12:08 AM Scope Withdrawal Time: 0 hours 10 minutes 45 seconds  Total Procedure Duration: 0 hours 14 minutes 49 seconds  Findings:                 The perianal and digital rectal examinations were                            normal. Pertinent negatives include normal                            sphincter tone and no palpable rectal lesions.                           A 2 mm polyp was found in the rectum. The polyp was                            sessile. The polyp was removed with a cold biopsy                            forceps. Resection and retrieval were complete.                            Estimated blood loss was minimal.  The exam was otherwise normal throughout the                            examined colon.                           The retroflexed view of the distal rectum and anal                            verge was normal and showed no anal or rectal                            abnormalities. Complications:            No immediate complications. Estimated Blood Loss:     Estimated blood loss was minimal. Impression:               - One 2 mm polyp in the rectum, removed with a cold                            biopsy forceps. Resected and retrieved.                           - The distal rectum and anal verge are normal on                            retroflexion view.                           - The FIT test appears to have been a false                            positive.                           - The patient's history of recurrent hematochezia                             can be attributed to internal hemorrhoids Recommendation:           - Patient has a contact number available for                            emergencies. The signs and symptoms of potential                            delayed complications were discussed with the                            patient. Return to normal activities tomorrow.                            Written discharge instructions were provided to the                            patient.                           -  Resume previous diet.                           - Continue present medications.                           - Await pathology results.                           - Repeat colonoscopy in 10 years for surveillance. Pammie Chirino E. Candis Schatz, MD 11/30/2021 9:16:55 AM This report has been signed electronically.

## 2021-11-30 NOTE — Progress Notes (Signed)
Called to room to assist during endoscopic procedure.  Patient ID and intended procedure confirmed with present staff. Received instructions for my participation in the procedure from the performing physician.  

## 2021-11-30 NOTE — Progress Notes (Signed)
VS-Melissa

## 2021-11-30 NOTE — Progress Notes (Signed)
[facility] History and Physical   Primary Care Physician:  Gildardo Pounds, NP   Reason for Procedure:   Positive FIT test  Plan:    Colonoscopy     HPI: Monica Brennan is a 45 y.o. female undergoing colonoscopy due to positive FIT test.  She also has intermittent painless hematochezia otherwise no chronic GI symptoms.  No family history of colon cancer.   Past Medical History:  Diagnosis Date   Hypertension Dx 2006   previously, on lisinopril. stopped lisinopril when medicaid ran out in 2007.    Morbid obesity ([place])    Prediabetes     Past Surgical History:  Procedure Laterality Date   CESAREAN SECTION     x 3, 1998,2000, 2006   COLONOSCOPY     DILATION AND CURETTAGE, DIAGNOSTIC / THERAPEUTIC     due to miscarriage   FOOT SURGERY Left 04/2021   Bone spur   IR ANGIOGRAM PELVIS SELECTIVE OR SUPRASELECTIVE  11/03/2021   IR ANGIOGRAM PELVIS SELECTIVE OR SUPRASELECTIVE  11/03/2021   IR ANGIOGRAM SELECTIVE EACH ADDITIONAL VESSEL  11/03/2021   IR ANGIOGRAM SELECTIVE EACH ADDITIONAL VESSEL  11/03/2021   IR EMBO TUMOR ORGAN ISCHEMIA INFARCT INC GUIDE ROADMAPPING  11/03/2021   IR RADIOLOGIST EVAL & MGMT  09/22/2021   IR RADIOLOGIST EVAL & MGMT  11/18/2021   IR US GUIDE VASC ACCESS LEFT  11/03/2021   TUBAL LIGATION  12/05/2004    Prior to Admission medications   Medication Sig Start Date End Date Taking? Authorizing Provider  amLODipine (NORVASC) 10 MG tablet TAKE 1 TABLET (10 MG TOTAL) BY MOUTH DAILY 10/08/21  Yes Charlott Rakes, MD  docusate sodium (COLACE) 100 MG capsule Take 1 capsule (100 mg total) by mouth 2 (two) times daily. 11/03/21   Han, Aimee H, PA-C  oxyCODONE-acetaminophen (PERCOCET) 7.5-325 MG tablet Take 1 tablet by mouth every 4 (four) hours as needed for severe pain. 11/03/21   Han, Aimee H, PA-C  promethazine (PHENERGAN) 12.5 MG tablet Take 1 tablet (12.5 mg total) by mouth every 4 (four) hours as needed for nausea or vomiting. Take this  with Percocet (oxycodone and acetaminophen) to prevent constipation. 11/03/21   Han, Aimee H, PA-C  cyclobenzaprine (FLEXERIL) 10 MG tablet TAKE 1 TABLET BY MOUTH 3 (THREE) TIMES DAILY AS NEEDED FOR MUSCLE SPASMS. 04/02/21   Gildardo Pounds, NP  losartan (COZAAR) 25 MG tablet TAKE 1 TABLET (25 MG TOTAL) BY MOUTH DAILY. 10/08/21   Charlott Rakes, MD  nystatin cream (MYCOSTATIN) Apply 1 application topically 2 (two) times daily. To breast area and groin 03/31/21   Gildardo Pounds, NP  triamcinolone (KENALOG) 0.025 % ointment APPLY 1 APPLICATION TOPICALLY 2 (TWO) TIMES DAILY. 03/31/21 03/31/22  Gildardo Pounds, NP    Current Outpatient Medications  Medication Sig Dispense Refill   amLODipine (NORVASC) 10 MG tablet TAKE 1 TABLET (10 MG TOTAL) BY MOUTH DAILY 30 tablet 2   docusate sodium (COLACE) 100 MG capsule Take 1 capsule (100 mg total) by mouth 2 (two) times daily. 30 capsule 0   oxyCODONE-acetaminophen (PERCOCET) 7.5-325 MG tablet Take 1 tablet by mouth every 4 (four) hours as needed for severe pain. 30 tablet 0   promethazine (PHENERGAN) 12.5 MG tablet Take 1 tablet (12.5 mg total) by mouth every 4 (four) hours as needed for nausea or vomiting. Take this with Percocet (oxycodone and acetaminophen) to prevent constipation. 30 tablet 0   cyclobenzaprine (FLEXERIL) 10 MG tablet TAKE 1 TABLET BY MOUTH  3 (THREE) TIMES DAILY AS NEEDED FOR MUSCLE SPASMS. 60 tablet 2   losartan (COZAAR) 25 MG tablet TAKE 1 TABLET (25 MG TOTAL) BY MOUTH DAILY. 30 tablet 2   nystatin cream (MYCOSTATIN) Apply 1 application topically 2 (two) times daily. To breast area and groin 60 g 1   triamcinolone (KENALOG) 0.025 % ointment APPLY 1 APPLICATION TOPICALLY 2 (TWO) TIMES DAILY. 60 g 2   Current Facility-Administered Medications  Medication Dose Route Frequency Provider Last Rate Last Admin   0.9 %  sodium chloride infusion  500 mL Intravenous Once Daryel November, MD        Allergies as of 11/30/2021   (No Known  Allergies)    Family History  Problem Relation Age of Onset   Hypertension Mother    Heart disease Mother    Sleep apnea Mother    Depression Mother    Hypertension Father    Hypertension Sister    Anxiety disorder Sister    Depression Sister    Hypertension Sister    Anxiety disorder Sister    Depression Sister    Diabetes Brother    Hypertension Brother    Anxiety disorder Brother    Depression Brother    Diabetes Brother    Hypertension Brother    Depression Brother    Diabetes Maternal Aunt    Cancer Maternal Aunt        lung    Hypertension Maternal Grandmother    ADD / ADHD Daughter    Depression Daughter    Diabetes Son    Thyroid disease Son    ADD / ADHD Son    Epilepsy Son    Autism Son    Heart defect Nephew    Colon cancer Neg Hx    Esophageal cancer Neg Hx    Rectal cancer Neg Hx    Stomach cancer Neg Hx     Social History   Socioeconomic History   Marital status: Significant Other    Spouse name: Not on file   Number of children: 3   Years of education: some colle   Highest education level: Not on file  Occupational History   Occupation: TEACHER    Employer: [employer]  Tobacco Use   Smoking status: Never   Smokeless tobacco: Never  Vaping Use   Vaping Use: Never used  Substance and Sexual Activity   Alcohol use: No   Drug use: No   Sexual activity: Yes    Birth control/protection: None  Other Topics Concern   Not on file  Social History Narrative   Teaches infants.   Lives with 3 children and fiance.    Social Determinants of Health   Financial Resource Strain: Not on file  Food Insecurity: Not on file  Transportation Needs: Not on file  Physical Activity: Not on file  Stress: Not on file  Social Connections: Not on file  Intimate Partner Violence: Not on file    Review of Systems:  All other review of systems negative except as mentioned in the HPI.  Physical Exam: Vital signs BP 110/85    Pulse 80    Temp 97.8 F  (36.6 C) (Temporal)    Ht 5\' 3"  (1.6 m)    Wt 237 lb (107.5 kg)    LMP 11/22/2021 (Approximate)    SpO2 97%    BMI 41.98 kg/m   General:   Alert,  Well-developed, well-nourished, pleasant and cooperative in NAD Airway:  Mallampati 3 Lungs:  Clear  throughout to auscultation.   Heart:  Regular rate and rhythm; no murmurs, clicks, rubs,  or gallops. Abdomen:  Soft, nontender and nondistended. Normal bowel sounds.   Neuro/Psych:  Normal mood and affect. A and O x 3   Velmer Woelfel E. Candis Schatz, MD [facility]

## 2021-12-02 ENCOUNTER — Telehealth: Payer: Self-pay

## 2021-12-02 NOTE — Telephone Encounter (Signed)
°  Follow up Call-  Call back number 11/30/2021  Post procedure Call Back phone  # [phone]  Permission to leave phone message Yes  Some recent data might be hidden     Patient questions:  Do you have a fever, pain , or abdominal swelling? No. Pain Score  0 *  Have you tolerated food without any problems? Yes.    Have you been able to return to your normal activities? Yes.    Do you have any questions about your discharge instructions: Diet   No. Medications  No. Follow up visit  No.  Do you have questions or concerns about your Care? No.  Actions: * If pain score is 4 or above: No action needed, pain <4.

## 2021-12-13 NOTE — Progress Notes (Signed)
Monica Brennan,  Good news: the polyp (or polyps) that I removed during your recent examination were NOT precancerous.  The pathology reports the presence of 'foamy histiocytes' which is a nonspecific finding, but, in the colon is thought to most likely represent an area of previous inflammation.   You should continue to follow current colorectal cancer screening guidelines with a repeat colonoscopy in 10 years.   If you develop any new rectal bleeding, abdominal pain or significant bowel habit changes, please contact me before then.

## 2021-12-15 ENCOUNTER — Other Ambulatory Visit: Payer: Self-pay

## 2021-12-15 ENCOUNTER — Ambulatory Visit: Payer: Self-pay | Attending: Nurse Practitioner | Admitting: Nurse Practitioner

## 2021-12-15 ENCOUNTER — Encounter: Payer: Self-pay | Admitting: Nurse Practitioner

## 2021-12-15 DIAGNOSIS — M545 Low back pain, unspecified: Secondary | ICD-10-CM

## 2021-12-15 DIAGNOSIS — G8929 Other chronic pain: Secondary | ICD-10-CM

## 2021-12-15 DIAGNOSIS — I10 Essential (primary) hypertension: Secondary | ICD-10-CM

## 2021-12-15 MED ORDER — AMLODIPINE BESYLATE 10 MG PO TABS: ORAL_TABLET | ORAL | 1 refills | Status: DC

## 2021-12-15 MED ORDER — LOSARTAN POTASSIUM 25 MG PO TABS: ORAL_TABLET | ORAL | 1 refills | Status: DC

## 2021-12-15 MED ORDER — CYCLOBENZAPRINE HCL 10 MG PO TABS: ORAL_TABLET | ORAL | 2 refills | Status: DC

## 2021-12-15 NOTE — Progress Notes (Signed)
Virtual Visit via Telephone Note Due to national recommendations of social distancing due to [place] 19, telehealth visit is felt to be most appropriate for this patient at this time.  I discussed the limitations, risks, security and privacy concerns of performing an evaluation and management service by telephone and the availability of in person appointments. I also discussed with the patient that there may be a patient responsible charge related to this service. The patient expressed understanding and agreed to proceed.    I connected with Monica Brennan on 12/15/21  at   1:30 PM EST  EDT by telephone and verified that I am speaking with the correct person using two identifiers.  Location of Patient: Private Residence   Location of Provider: [place] and [employer] Office    Persons participating in Telemedicine visit: Monica Rankins FNP-BC [name]    History of Present Illness: Telemedicine visit for: HTN She has a past medical history of Hypertension (Dx 2006), Morbid obesity ([place]), and Prediabetes.   Chronic back Pain She takes flexeril as needed for her low back pain. Symptoms are well controlled.   HTN Blood pressure is well controlled with losartan 25 mg daily and amlodipine 10 mg daily. BP Readings from Last 3 Encounters:  11/30/21 116/72  11/03/21 129/86  10/06/21 128/84     Past Medical History:  Diagnosis Date   Hypertension Dx 2006   previously, on lisinopril. stopped lisinopril when medicaid ran out in 2007.    Morbid obesity ([place])    Prediabetes     Past Surgical History:  Procedure Laterality Date   CESAREAN SECTION     x 3, 1998,2000, 2006   COLONOSCOPY     DILATION AND CURETTAGE, DIAGNOSTIC / THERAPEUTIC     due to miscarriage   FOOT SURGERY Left 04/2021   Bone spur   IR ANGIOGRAM PELVIS SELECTIVE OR SUPRASELECTIVE  11/03/2021   IR ANGIOGRAM PELVIS SELECTIVE OR SUPRASELECTIVE  11/03/2021   IR ANGIOGRAM SELECTIVE EACH  ADDITIONAL VESSEL  11/03/2021   IR ANGIOGRAM SELECTIVE EACH ADDITIONAL VESSEL  11/03/2021   IR EMBO TUMOR ORGAN ISCHEMIA INFARCT INC GUIDE ROADMAPPING  11/03/2021   IR RADIOLOGIST EVAL & MGMT  09/22/2021   IR RADIOLOGIST EVAL & MGMT  11/18/2021   IR US GUIDE VASC ACCESS LEFT  11/03/2021   TUBAL LIGATION  12/05/2004    Family History  Problem Relation Age of Onset   Hypertension Mother    Heart disease Mother    Sleep apnea Mother    Depression Mother    Hypertension Father    Hypertension Sister    Anxiety disorder Sister    Depression Sister    Hypertension Sister    Anxiety disorder Sister    Depression Sister    Diabetes Brother    Hypertension Brother    Anxiety disorder Brother    Depression Brother    Diabetes Brother    Hypertension Brother    Depression Brother    Diabetes Maternal Aunt    Cancer Maternal Aunt        lung    Hypertension Maternal Grandmother    ADD / ADHD Daughter    Depression Daughter    Diabetes Son    Thyroid disease Son    ADD / ADHD Son    Epilepsy Son    Autism Son    Heart defect Nephew    Colon cancer Neg Hx    Esophageal cancer Neg Hx    Rectal cancer  Neg Hx    Stomach cancer Neg Hx     Social History   Socioeconomic History   Marital status: Significant Other    Spouse name: Not on file   Number of children: 3   Years of education: some colle   Highest education level: Not on file  Occupational History   Occupation: TEACHER    Employer: [employer]  Tobacco Use   Smoking status: Never   Smokeless tobacco: Never  Vaping Use   Vaping Use: Never used  Substance and Sexual Activity   Alcohol use: No   Drug use: No   Sexual activity: Yes    Birth control/protection: None  Other Topics Concern   Not on file  Social History Narrative   Teaches infants.   Lives with 3 children and fiance.    Social Determinants of Health   Financial Resource Strain: Not on file  Food Insecurity: Not on file  Transportation Needs: Not  on file  Physical Activity: Not on file  Stress: Not on file  Social Connections: Not on file     Observations/Objective: Awake, alert and oriented x 3   Review of Systems  Constitutional:  Negative for fever, malaise/fatigue and weight loss.  HENT: Negative.  Negative for nosebleeds.   Eyes: Negative.  Negative for blurred vision, double vision and photophobia.  Respiratory: Negative.  Negative for cough and shortness of breath.   Cardiovascular: Negative.  Negative for chest pain, palpitations and leg swelling.  Gastrointestinal: Negative.  Negative for heartburn, nausea and vomiting.  Musculoskeletal:  Positive for back pain and myalgias.  Neurological: Negative.  Negative for dizziness, focal weakness, seizures and headaches.  Psychiatric/Behavioral: Negative.  Negative for suicidal ideas.    Assessment and Plan: Diagnoses and all orders for this visit:  Primary hypertension -     losartan (COZAAR) 25 MG tablet; TAKE 1 TABLET (25 MG TOTAL) BY MOUTH DAILY. -     amLODipine (NORVASC) 10 MG tablet; TAKE 1 TABLET (10 MG TOTAL) BY MOUTH DAILY Continue all antihypertensives as prescribed.  Remember to bring in your blood pressure log with you for your follow up appointment.  DASH/Mediterranean Diets are healthier choices for HTN.    Chronic right-sided low back pain without sciatica -     cyclobenzaprine (FLEXERIL) 10 MG tablet; TAKE 1 TABLET BY MOUTH 3 (THREE) TIMES DAILY AS NEEDED FOR MUSCLE SPASMS. Work on losing weight to help reduce back pain. May alternate with heat and ice application for pain relief. May also alternate with acetaminophen and Ibuprofen as prescribed for back pain. Other alternatives include massage, acupuncture and water aerobics and staying active.        Follow Up Instructions Return in about 8 weeks (around 02/09/2022) for HTN.     I discussed the assessment and treatment plan with the patient. The patient was provided an opportunity to ask questions and  all were answered. The patient agreed with the plan and demonstrated an understanding of the instructions.   The patient was advised to call back or seek an in-person evaluation if the symptoms worsen or if the condition fails to improve as anticipated.  I provided 12 minutes of non-face-to-face time during this encounter including median intraservice time, reviewing previous notes, labs, imaging, medications and explaining diagnosis and management.  Gildardo Pounds, FNP-BC

## 2022-01-19 ENCOUNTER — Ambulatory Visit: Payer: Medicaid Other | Admitting: Physician Assistant

## 2022-01-28 ENCOUNTER — Other Ambulatory Visit: Payer: Self-pay | Admitting: Interventional Radiology

## 2022-01-28 DIAGNOSIS — D250 Submucous leiomyoma of uterus: Secondary | ICD-10-CM

## 2022-02-18 ENCOUNTER — Other Ambulatory Visit: Payer: Self-pay

## 2022-02-18 ENCOUNTER — Ambulatory Visit: Payer: 59 | Attending: Nurse Practitioner | Admitting: Nurse Practitioner

## 2022-02-18 ENCOUNTER — Other Ambulatory Visit (HOSPITAL_COMMUNITY)
Admission: RE | Admit: 2022-02-18 | Discharge: 2022-02-18 | Disposition: A | Discharge status: Home / Self Care | Payer: 59 | Source: Ambulatory Visit | Attending: Nurse Practitioner | Admitting: Nurse Practitioner

## 2022-02-18 ENCOUNTER — Encounter: Payer: Self-pay | Admitting: Nurse Practitioner

## 2022-02-18 VITALS — BP 125/88 | HR 77 | Resp 18 | Ht 63.0 in | Wt 233.4 lb

## 2022-02-18 DIAGNOSIS — L988 Other specified disorders of the skin and subcutaneous tissue: Secondary | ICD-10-CM

## 2022-02-18 DIAGNOSIS — M545 Low back pain, unspecified: Secondary | ICD-10-CM

## 2022-02-18 DIAGNOSIS — D649 Anemia, unspecified: Secondary | ICD-10-CM

## 2022-02-18 DIAGNOSIS — B379 Candidiasis, unspecified: Secondary | ICD-10-CM

## 2022-02-18 DIAGNOSIS — Z1231 Encounter for screening mammogram for malignant neoplasm of breast: Secondary | ICD-10-CM

## 2022-02-18 DIAGNOSIS — I10 Essential (primary) hypertension: Secondary | ICD-10-CM

## 2022-02-18 DIAGNOSIS — N760 Acute vaginitis: Secondary | ICD-10-CM

## 2022-02-18 DIAGNOSIS — G8929 Other chronic pain: Secondary | ICD-10-CM

## 2022-02-18 DIAGNOSIS — Z114 Encounter for screening for human immunodeficiency virus [HIV]: Secondary | ICD-10-CM

## 2022-02-18 DIAGNOSIS — L209 Atopic dermatitis, unspecified: Secondary | ICD-10-CM

## 2022-02-18 DIAGNOSIS — R7303 Prediabetes: Secondary | ICD-10-CM

## 2022-02-18 MED ORDER — NYSTATIN 100000 UNIT/GM EX CREA
1.0000 | TOPICAL_CREAM | Freq: Two times a day (BID) | CUTANEOUS | 1 refills | Status: DC
Start: 2022-02-18 — End: 2022-05-31
  Filled 2022-02-18: qty 60, 30d supply, fill #0
  Filled 2022-03-21: qty 60, 30d supply, fill #1

## 2022-02-18 MED ORDER — TRIAMCINOLONE ACETONIDE 0.025 % EX OINT
TOPICAL_OINTMENT | Freq: Two times a day (BID) | CUTANEOUS | 2 refills | Status: DC
  Filled 2022-02-18: qty 60, 30d supply, fill #0

## 2022-02-18 MED ORDER — AMLODIPINE BESYLATE 10 MG PO TABS
ORAL_TABLET | ORAL | 1 refills | Status: DC
  Filled 2022-02-18: qty 30, 30d supply, fill #0
  Filled 2022-03-21: qty 30, 30d supply, fill #1
  Filled 2022-05-10 – 2022-05-18 (×2): qty 30, 30d supply, fill #2

## 2022-02-18 MED ORDER — CYCLOBENZAPRINE HCL 10 MG PO TABS
ORAL_TABLET | ORAL | 2 refills | Status: DC
  Filled 2022-02-18: qty 60, 20d supply, fill #0
  Filled 2022-03-21: qty 60, 20d supply, fill #1
  Filled 2022-05-10 – 2022-05-18 (×2): qty 60, 20d supply, fill #2

## 2022-02-18 MED ORDER — LOSARTAN POTASSIUM 25 MG PO TABS
ORAL_TABLET | ORAL | 1 refills | Status: DC
  Filled 2022-02-18: qty 30, 30d supply, fill #0
  Filled 2022-03-21: qty 30, 30d supply, fill #1
  Filled 2022-05-10 – 2022-05-18 (×2): qty 30, 30d supply, fill #2

## 2022-02-18 MED ORDER — METRONIDAZOLE 500 MG PO TABS
500.0000 mg | ORAL_TABLET | Freq: Two times a day (BID) | ORAL | 0 refills | Status: AC
  Filled 2022-02-18: qty 14, 7d supply, fill #0

## 2022-02-19 ENCOUNTER — Encounter: Payer: Self-pay | Admitting: Nurse Practitioner

## 2022-02-19 LAB — IRON,TIBC AND FERRITIN PANEL
Ferritin: 16 ng/mL (ref 15–150)
Iron Saturation: 7 % — CL (ref 15–55)
Iron: 31 ug/dL (ref 27–159)
Total Iron Binding Capacity: 425 ug/dL (ref 250–450)
UIBC: 394 ug/dL (ref 131–425)

## 2022-02-19 LAB — CMP14+EGFR
ALT: 21 IU/L (ref 0–32)
AST: 17 IU/L (ref 0–40)
Albumin/Globulin Ratio: 1.1 — ABNORMAL LOW (ref 1.2–2.2)
Albumin: 4.4 g/dL (ref 3.8–4.8)
Alkaline Phosphatase: 100 IU/L (ref 44–121)
BUN/Creatinine Ratio: 9 (ref 9–23)
BUN: 8 mg/dL (ref 6–24)
Bilirubin Total: 0.2 mg/dL (ref 0.0–1.2)
CO2: 27 mmol/L (ref 20–29)
Calcium: 9.8 mg/dL (ref 8.7–10.2)
Chloride: 101 mmol/L (ref 96–106)
Creatinine, Ser: 0.87 mg/dL (ref 0.57–1.00)
Globulin, Total: 3.9 g/dL (ref 1.5–4.5)
Glucose: 96 mg/dL (ref 70–99)
Potassium: 4.7 mmol/L (ref 3.5–5.2)
Sodium: 139 mmol/L (ref 134–144)
Total Protein: 8.3 g/dL (ref 6.0–8.5)
eGFR: 82 mL/min/{1.73_m2} (ref >59)

## 2022-02-19 LAB — CBC WITH DIFFERENTIAL/PLATELET
Basophils Absolute: 0 10*3/uL (ref 0.0–0.2)
Basos: 0 %
EOS (ABSOLUTE): 0.2 10*3/uL (ref 0.0–0.4)
Eos: 2 %
Hematocrit: 38 % (ref 34.0–46.6)
Hemoglobin: 12.8 g/dL (ref 11.1–15.9)
Immature Grans (Abs): 0 10*3/uL (ref 0.0–0.1)
Immature Granulocytes: 0 %
Lymphocytes Absolute: 2.8 10*3/uL (ref 0.7–3.1)
Lymphs: 31 %
MCH: 24.6 pg — ABNORMAL LOW (ref 26.6–33.0)
MCHC: 33.7 g/dL (ref 31.5–35.7)
MCV: 73 fL — ABNORMAL LOW (ref 79–97)
Monocytes Absolute: 0.8 10*3/uL (ref 0.1–0.9)
Monocytes: 9 %
Neutrophils Absolute: 5.3 10*3/uL (ref 1.4–7.0)
Neutrophils: 58 %
Platelets: 486 10*3/uL — ABNORMAL HIGH (ref 150–450)
RBC: 5.21 x10E6/uL (ref 3.77–5.28)
RDW: 18.5 % — ABNORMAL HIGH (ref 11.7–15.4)
WBC: 9.2 10*3/uL (ref 3.4–10.8)

## 2022-02-19 LAB — HEMOGLOBIN A1C
Est. average glucose Bld gHb Est-mCnc: 114 mg/dL
Hgb A1c MFr Bld: 5.6 % (ref 4.8–5.6)

## 2022-02-19 LAB — HIV ANTIBODY (ROUTINE TESTING W REFLEX): HIV Screen 4th Generation wRfx: NONREACTIVE

## 2022-02-19 NOTE — Progress Notes (Signed)
? ?Assessment & Plan:  ?Monica Brennan was seen today for hypertension. ? ?Diagnoses and all orders for this visit: ? ?Primary hypertension ?-     losartan (COZAAR) 25 MG tablet; TAKE 1 TABLET (25 MG TOTAL) BY MOUTH DAILY. ?-     amLODipine (NORVASC) 10 MG tablet; TAKE 1 TABLET (10 MG TOTAL) BY MOUTH DAILY ? ?Prediabetes ?-     CMP14+EGFR ?-     Hemoglobin A1c ? ?Anemia, unspecified type ?-     CBC with Differential ?-     Iron, TIBC and Ferritin Panel ? ?Atopic dermatitis, unspecified type ?-     triamcinolone (KENALOG) 0.025 % ointment; APPLY 1 APPLICATION TOPICALLY 2 (TWO) TIMES DAILY. ? ?Chronic atrophic candidiasis ?-     nystatin cream (MYCOSTATIN); Apply 1 application. topically 2 (two) times daily. To breast area and groin ? ?Chronic right-sided low back pain without sciatica ?-     cyclobenzaprine (FLEXERIL) 10 MG tablet; TAKE 1 TABLET BY MOUTH 3 (THREE) TIMES DAILY AS NEEDED FOR MUSCLE SPASMS. ? May alternate with heat and ice application for pain relief. May also alternate with acetaminophen and Ibuprofen as prescribed for back pain. Other alternatives include massage, acupuncture and water aerobics.   ? ? ?Acute vaginitis ?-     Cervicovaginal ancillary only ?-     metroNIDAZOLE (FLAGYL) 500 MG tablet; Take 1 tablet (500 mg total) by mouth 2 (two) times daily for 7 days. ? ?Encounter for screening for HIV ?-     HIV antibody (with reflex) ? ? ? ?Patient has been counseled on age-appropriate routine health concerns for screening and prevention. These are reviewed and up-to-date. Referrals have been placed accordingly. Immunizations are up-to-date or declined.    ?Subjective:  ? ?Chief Complaint  ?Patient presents with  ? Hypertension  ? ?Hypertension ?Pertinent negatives include no blurred vision, chest pain, headaches, malaise/fatigue, palpitations or shortness of breath.  ?Monica Brennan 46 y.o. female presents to office today for follow up to HTN ?Blood pressure is well controlled with losartan 25 mg daily  and amlodipine 10 mg daily as prescribed.  ?BP Readings from Last 3 Encounters:  ?02/18/22 125/88  ?11/30/21 116/72  ?11/03/21 129/86  ?  ?Vaginitis ?She endorses vaginal irritation and increased discharge.  ? ?Skin Rash ?She has a chronic skin lesion on the right areola that comes and goes. It lessened in size significantly when she was using triamcinolone daily. After she stopped the lesion increased in size.  Sometimes drains and is hypopigmented in color. The size has increased over the years. Associated symptoms include itching. ?Mammogram 05-2021 Normal  ? ?Review of Systems  ?Constitutional:  Negative for fever, malaise/fatigue and weight loss.  ?HENT: Negative.  Negative for nosebleeds.   ?Eyes: Negative.  Negative for blurred vision, double vision and photophobia.  ?Respiratory: Negative.  Negative for cough and shortness of breath.   ?Cardiovascular: Negative.  Negative for chest pain, palpitations and leg swelling.  ?Gastrointestinal: Negative.  Negative for heartburn, nausea and vomiting.  ?Genitourinary:   ?     SEE HPI  ?Musculoskeletal: Negative.  Negative for myalgias.  ?Skin:  Positive for itching and rash.  ?Neurological: Negative.  Negative for dizziness, focal weakness, seizures and headaches.  ?Psychiatric/Behavioral: Negative.  Negative for suicidal ideas.   ? ?Past Medical History:  ?Diagnosis Date  ? Hypertension Dx 2006  ? previously, on lisinopril. stopped lisinopril when medicaid ran out in 2007.   ? Morbid obesity ([place])   ? Prediabetes   ? ? ?  Past Surgical History:  ?Procedure Laterality Date  ? CESAREAN SECTION    ? x 3, 1998,2000, 2006  ? COLONOSCOPY    ? DILATION AND CURETTAGE, DIAGNOSTIC / THERAPEUTIC    ? due to miscarriage  ? FOOT SURGERY Left 04/2021  ? Bone spur  ? IR ANGIOGRAM PELVIS SELECTIVE OR SUPRASELECTIVE  11/03/2021  ? IR ANGIOGRAM PELVIS SELECTIVE OR SUPRASELECTIVE  11/03/2021  ? IR ANGIOGRAM SELECTIVE EACH ADDITIONAL VESSEL  11/03/2021  ? IR ANGIOGRAM SELECTIVE EACH  ADDITIONAL VESSEL  11/03/2021  ? IR EMBO TUMOR ORGAN ISCHEMIA INFARCT INC GUIDE ROADMAPPING  11/03/2021  ? IR RADIOLOGIST EVAL & MGMT  09/22/2021  ? IR RADIOLOGIST EVAL & MGMT  11/18/2021  ? IR US GUIDE VASC ACCESS LEFT  11/03/2021  ? TUBAL LIGATION  12/05/2004  ? ? ?Family History  ?Problem Relation Age of Onset  ? Hypertension Mother   ? Heart disease Mother   ? Sleep apnea Mother   ? Depression Mother   ? Hypertension Father   ? Hypertension Sister   ? Anxiety disorder Sister   ? Depression Sister   ? Hypertension Sister   ? Anxiety disorder Sister   ? Depression Sister   ? Diabetes Brother   ? Hypertension Brother   ? Anxiety disorder Brother   ? Depression Brother   ? Diabetes Brother   ? Hypertension Brother   ? Depression Brother   ? Diabetes Maternal Aunt   ? Cancer Maternal Aunt   ?     lung   ? Hypertension Maternal Grandmother   ? ADD / ADHD Daughter   ? Depression Daughter   ? Diabetes Son   ? Thyroid disease Son   ? ADD / ADHD Son   ? Epilepsy Son   ? Autism Son   ? Heart defect Nephew   ? Colon cancer Neg Hx   ? Esophageal cancer Neg Hx   ? Rectal cancer Neg Hx   ? Stomach cancer Neg Hx   ? ? ?Social History Reviewed with no changes to be made today.  ? ?Outpatient Medications Prior to Visit  ?Medication Sig Dispense Refill  ? docusate sodium (COLACE) 100 MG capsule Take 1 capsule (100 mg total) by mouth 2 (two) times daily. (Patient not taking: Reported on 02/18/2022) 30 capsule 0  ? amLODipine (NORVASC) 10 MG tablet TAKE 1 TABLET (10 MG TOTAL) BY MOUTH DAILY 90 tablet 1  ? cyclobenzaprine (FLEXERIL) 10 MG tablet TAKE 1 TABLET BY MOUTH 3 (THREE) TIMES DAILY AS NEEDED FOR MUSCLE SPASMS. 60 tablet 2  ? losartan (COZAAR) 25 MG tablet TAKE 1 TABLET (25 MG TOTAL) BY MOUTH DAILY. 90 tablet 1  ? nystatin cream (MYCOSTATIN) Apply 1 application topically 2 (two) times daily. To breast area and groin 60 g 1  ? triamcinolone (KENALOG) 0.025 % ointment APPLY 1 APPLICATION TOPICALLY 2 (TWO) TIMES DAILY. 60 g 2   ? ?No facility-administered medications prior to visit.  ? ? ?No Known Allergies ? ?   ?Objective:  ?  ?BP 125/88   Pulse 77   Resp 18   Ht 5' 3"  (1.6 m)   Wt 233 lb 6 oz (105.9 kg)   LMP 01/27/2022 (Exact Date)   SpO2 99%   BMI 41.34 kg/m?  ?Wt Readings from Last 3 Encounters:  ?02/18/22 233 lb 6 oz (105.9 kg)  ?11/30/21 237 lb (107.5 kg)  ?11/03/21 236 lb 9.6 oz (107.3 kg)  ? ? ?Physical Exam ?Vitals and nursing note  reviewed.  ?Constitutional:   ?   Appearance: She is well-developed.  ?HENT:  ?   Head: Normocephalic and atraumatic.  ?Cardiovascular:  ?   Rate and Rhythm: Normal rate and regular rhythm.  ?   Heart sounds: Normal heart sounds. No murmur heard. ?  No friction rub. No gallop.  ?Pulmonary:  ?   Effort: Pulmonary effort is normal. No tachypnea or respiratory distress.  ?   Breath sounds: Normal breath sounds. No decreased breath sounds, wheezing, rhonchi or rales.  ?Chest:  ?   Chest wall: No tenderness.  ?Abdominal:  ?   General: Bowel sounds are normal.  ?   Palpations: Abdomen is soft.  ?Musculoskeletal:     ?   General: Normal range of motion.  ?   Cervical back: Normal range of motion.  ?Skin: ?   General: Skin is warm and dry.  ?Neurological:  ?   Mental Status: She is alert and oriented to person, place, and time.  ?   Coordination: Coordination normal.  ?Psychiatric:     ?   Behavior: Behavior normal. Behavior is cooperative.     ?   Thought Content: Thought content normal.     ?   Judgment: Judgment normal.  ? ? ? ? ?   ?Patient has been counseled extensively about nutrition and exercise as well as the importance of adherence with medications and regular follow-up. The patient was given clear instructions to go to ER or return to medical center if symptoms don't improve, worsen or new problems develop. The patient verbalized understanding.  ? ?Follow-up: Return in about 3 months (around 05/21/2022).  ? ?Gildardo Pounds, FNP-BC ?[facility] ?[place], [place] ?[phone]   ?02/19/2022, 9:19 PM ?

## 2022-02-21 ENCOUNTER — Other Ambulatory Visit: Payer: Self-pay

## 2022-02-21 ENCOUNTER — Other Ambulatory Visit: Payer: Self-pay | Admitting: Nurse Practitioner

## 2022-02-21 LAB — CERVICOVAGINAL ANCILLARY ONLY
Bacterial Vaginitis (gardnerella): POSITIVE — AB
Candida Glabrata: NEGATIVE
Candida Vaginitis: POSITIVE — AB
Chlamydia: NEGATIVE
Comment: NEGATIVE
Comment: NEGATIVE
Comment: NEGATIVE
Comment: NEGATIVE
Comment: NEGATIVE
Comment: NORMAL
Neisseria Gonorrhea: NEGATIVE
Trichomonas: NEGATIVE

## 2022-02-21 MED ORDER — FLUCONAZOLE 150 MG PO TABS
150.0000 mg | ORAL_TABLET | Freq: Once | ORAL | 0 refills | Status: AC
  Filled 2022-02-21: qty 2, 2d supply, fill #0

## 2022-03-03 ENCOUNTER — Ambulatory Visit
Admission: RE | Admit: 2022-03-03 | Discharge: 2022-03-03 | Disposition: A | Discharge status: Home / Self Care | Payer: 59 | Source: Ambulatory Visit | Attending: Interventional Radiology | Admitting: Interventional Radiology

## 2022-03-03 DIAGNOSIS — D250 Submucous leiomyoma of uterus: Secondary | ICD-10-CM

## 2022-03-03 HISTORY — PX: IR RADIOLOGIST EVAL & MGMT: IMG5224

## 2022-03-03 NOTE — Progress Notes (Signed)
? ?Reason for follow up: ?3 month follow up after [place], virtual telephone visit ?  ?History of present illness: ?Monica Brennan is a 46 y.o. G3P2 female with history of symptomatic uterine fibroids, primarily menorrhagia, now status post bilateral uterine artery embolization on 11/03/21. ?  ?She continues to do well after the procedure.  Her menstrual cycles have slowed by several days each month, and bleeding has become progressively less.  Cramping that she endorsed immediately after the procedure has resolved.  She was also having some light tan discharge which has ceased.  Overall she is very pleased with the results so far.  She states that she is starting iron pills to address some chronic anemia discovered by her PCP.  Recent CBC shows favorable response.  No new symptoms or complaints. ? ?Past Medical History:  ?Diagnosis Date  ? Hypertension Dx 2006  ? previously, on lisinopril. stopped lisinopril when medicaid ran out in 2007.   ? Morbid obesity ([place])   ? Prediabetes   ? ? ?Past Surgical History:  ?Procedure Laterality Date  ? CESAREAN SECTION    ? x 3, 1998,2000, 2006  ? COLONOSCOPY    ? DILATION AND CURETTAGE, DIAGNOSTIC / THERAPEUTIC    ? due to miscarriage  ? FOOT SURGERY Left 04/2021  ? Bone spur  ? IR ANGIOGRAM PELVIS SELECTIVE OR SUPRASELECTIVE  11/03/2021  ? IR ANGIOGRAM PELVIS SELECTIVE OR SUPRASELECTIVE  11/03/2021  ? IR ANGIOGRAM SELECTIVE EACH ADDITIONAL VESSEL  11/03/2021  ? IR ANGIOGRAM SELECTIVE EACH ADDITIONAL VESSEL  11/03/2021  ? IR EMBO TUMOR ORGAN ISCHEMIA INFARCT INC GUIDE ROADMAPPING  11/03/2021  ? IR RADIOLOGIST EVAL & MGMT  09/22/2021  ? IR RADIOLOGIST EVAL & MGMT  11/18/2021  ? IR US GUIDE VASC ACCESS LEFT  11/03/2021  ? TUBAL LIGATION  12/05/2004  ? ? ?Allergies: ?Patient has no known allergies. ? ?Medications: ?Prior to Admission medications   ?Medication Sig Start Date End Date Taking? Authorizing Provider  ?docusate sodium (COLACE) 100 MG capsule Take 1 capsule (100 mg  total) by mouth 2 (two) times daily. ?Patient not taking: Reported on 02/18/2022 11/03/21   Han, Aimee H, PA-C  ?amLODipine (NORVASC) 10 MG tablet TAKE 1 TABLET (10 MG TOTAL) BY MOUTH DAILY 02/18/22   Gildardo Pounds, NP  ?cyclobenzaprine (FLEXERIL) 10 MG tablet TAKE 1 TABLET BY MOUTH 3 (THREE) TIMES DAILY AS NEEDED FOR MUSCLE SPASMS. 02/18/22   Gildardo Pounds, NP  ?losartan (COZAAR) 25 MG tablet TAKE 1 TABLET (25 MG TOTAL) BY MOUTH DAILY. 02/18/22   Gildardo Pounds, NP  ?nystatin cream (MYCOSTATIN) Apply 1 application. topically 2 (two) times daily. To breast area and groin 02/18/22   Gildardo Pounds, NP  ?triamcinolone (KENALOG) 0.025 % ointment APPLY 1 APPLICATION TOPICALLY 2 (TWO) TIMES DAILY. 02/18/22 02/18/23  Gildardo Pounds, NP  ?  ? ?Family History  ?Problem Relation Age of Onset  ? Hypertension Mother   ? Heart disease Mother   ? Sleep apnea Mother   ? Depression Mother   ? Hypertension Father   ? Hypertension Sister   ? Anxiety disorder Sister   ? Depression Sister   ? Hypertension Sister   ? Anxiety disorder Sister   ? Depression Sister   ? Diabetes Brother   ? Hypertension Brother   ? Anxiety disorder Brother   ? Depression Brother   ? Diabetes Brother   ? Hypertension Brother   ? Depression Brother   ? Diabetes Maternal Aunt   ?  Cancer Maternal Aunt   ?     lung   ? Hypertension Maternal Grandmother   ? ADD / ADHD Daughter   ? Depression Daughter   ? Diabetes Son   ? Thyroid disease Son   ? ADD / ADHD Son   ? Epilepsy Son   ? Autism Son   ? Heart defect Nephew   ? Colon cancer Neg Hx   ? Esophageal cancer Neg Hx   ? Rectal cancer Neg Hx   ? Stomach cancer Neg Hx   ? ? ?Social History  ? ?Socioeconomic History  ? Marital status: Significant Other  ?  Spouse name: Not on file  ? Number of children: 3  ? Years of education: some colle  ? Highest education level: Not on file  ?Occupational History  ? Occupation: TEACHER  ?  Employer: [employer]  ?Tobacco Use  ? Smoking status: Never  ? Smokeless tobacco:  Never  ?Vaping Use  ? Vaping Use: Never used  ?Substance and Sexual Activity  ? Alcohol use: No  ? Drug use: No  ? Sexual activity: Yes  ?  Birth control/protection: None  ?Other Topics Concern  ? Not on file  ?Social History Narrative  ? Teaches infants.  ? Lives with 3 children and fiance.   ? ?Social Determinants of Health  ? ?Financial Resource Strain: Not on file  ?Food Insecurity: Not on file  ?Transportation Needs: Not on file  ?Physical Activity: Not on file  ?Stress: Not on file  ?Social Connections: Not on file  ? ? ? ?Vital Signs: ?There were no vitals taken for this visit. ? ?No physical examination was performed in lieu of virtual telephone clinic visit. ? ?Imaging: ?[place] 11/03/21 ?Left: ?  ?Right: ?  ?Post-embolization: ?  ? ?Labs: ? ?CBC: ?Recent Labs  ?  08/25/21 ?1418 11/03/21 ?6283 02/18/22 ?1554  ?WBC 9.7 6.9 9.2  ?HGB 11.1 11.0* 12.8  ?HCT 32.4* 32.9* 38.0  ?PLT 474* 455* 486*  ? ? ?COAGS: ?Recent Labs  ?  11/03/21 ?0738  ?INR 1.0  ? ? ?BMP: ?Recent Labs  ?  03/31/21 ?1523 11/03/21 ?6629 02/18/22 ?1554  ?NA 140 136 139  ?K 4.3 4.1 4.7  ?CL 101 105 101  ?CO2 '24 24 27  '$ ?GLUCOSE 88 104* 96  ?BUN '6 10 8  '$ ?CALCIUM 9.3 8.8* 9.8  ?CREATININE 0.94 0.86 0.87  ?GFRNONAA  --  >60  --   ? ? ?LIVER FUNCTION TESTS: ?Recent Labs  ?  02/18/22 ?1554  ?BILITOT 0.2  ?AST 17  ?ALT 21  ?ALKPHOS 100  ?PROT 8.3  ?ALBUMIN 4.4  ? ? ?Assessment and Plan: ?46 year old female with history of symptomatic (menorrhagia) uterine fibroids status post uterine artery embolization on 11/03/21, recovering well. ?  ?Follow up in 6 months, or sooner if needed. ? ?Electronically Signed: ?Huxton Glaus J Srihaan Mastrangelo ?03/03/2022, 12:00 PM ? ? ?I spent a total of 15 Minutes in telephone clinical consultation, greater than 50% of which was counseling/coordinating care for symptomatic uterine fibroids. ? ? ? ? ? ?

## 2022-03-21 ENCOUNTER — Other Ambulatory Visit: Payer: Self-pay

## 2022-03-25 ENCOUNTER — Other Ambulatory Visit: Payer: Self-pay

## 2022-05-10 ENCOUNTER — Other Ambulatory Visit (HOSPITAL_COMMUNITY): Payer: Self-pay

## 2022-05-18 ENCOUNTER — Other Ambulatory Visit: Payer: Self-pay

## 2022-05-18 ENCOUNTER — Other Ambulatory Visit (HOSPITAL_COMMUNITY): Payer: Self-pay

## 2022-05-31 ENCOUNTER — Other Ambulatory Visit: Payer: Self-pay

## 2022-05-31 ENCOUNTER — Ambulatory Visit: Payer: 59 | Attending: Nurse Practitioner | Admitting: Nurse Practitioner

## 2022-05-31 ENCOUNTER — Encounter: Payer: Self-pay | Admitting: Nurse Practitioner

## 2022-05-31 DIAGNOSIS — B356 Tinea cruris: Secondary | ICD-10-CM

## 2022-05-31 DIAGNOSIS — I10 Essential (primary) hypertension: Secondary | ICD-10-CM

## 2022-05-31 DIAGNOSIS — M545 Low back pain, unspecified: Secondary | ICD-10-CM | POA: Diagnosis not present

## 2022-05-31 DIAGNOSIS — N760 Acute vaginitis: Secondary | ICD-10-CM | POA: Diagnosis not present

## 2022-05-31 DIAGNOSIS — L209 Atopic dermatitis, unspecified: Secondary | ICD-10-CM

## 2022-05-31 DIAGNOSIS — G8929 Other chronic pain: Secondary | ICD-10-CM

## 2022-05-31 MED ORDER — CYCLOBENZAPRINE HCL 10 MG PO TABS
ORAL_TABLET | ORAL | 2 refills | Status: DC
  Filled 2022-05-31: qty 60, fill #0
  Filled 2022-06-02: qty 60, 20d supply, fill #0
  Filled 2022-06-10: qty 60, 30d supply, fill #0
  Filled 2022-07-04: qty 60, 30d supply, fill #1

## 2022-05-31 MED ORDER — TRIAMCINOLONE ACETONIDE 0.025 % EX OINT
TOPICAL_OINTMENT | Freq: Two times a day (BID) | CUTANEOUS | 2 refills | Status: DC
  Filled 2022-05-31: qty 60, 30d supply, fill #0
  Filled 2022-07-04: qty 60, 30d supply, fill #1
  Filled 2022-09-08: qty 60, 30d supply, fill #2

## 2022-05-31 MED ORDER — METRONIDAZOLE 500 MG PO TABS
500.0000 mg | ORAL_TABLET | Freq: Two times a day (BID) | ORAL | 0 refills | Status: AC
  Filled 2022-05-31: qty 14, 7d supply, fill #0

## 2022-05-31 MED ORDER — NYSTATIN 100000 UNIT/GM EX CREA
1.0000 | TOPICAL_CREAM | Freq: Two times a day (BID) | CUTANEOUS | 1 refills | Status: DC
  Filled 2022-05-31: qty 60, 30d supply, fill #0
  Filled 2022-06-02 – 2022-07-04 (×2): qty 60, 30d supply, fill #1

## 2022-05-31 MED ORDER — FLUCONAZOLE 150 MG PO TABS
150.0000 mg | ORAL_TABLET | Freq: Once | ORAL | 0 refills | Status: AC
  Filled 2022-05-31: qty 1, 1d supply, fill #0

## 2022-05-31 MED ORDER — AMLODIPINE BESYLATE 10 MG PO TABS
ORAL_TABLET | ORAL | 1 refills | Status: DC
  Filled 2022-05-31 – 2022-06-02 (×2): qty 90, fill #0
  Filled 2022-06-10: qty 90, 90d supply, fill #0

## 2022-05-31 MED ORDER — LOSARTAN POTASSIUM 25 MG PO TABS
ORAL_TABLET | ORAL | 1 refills | Status: DC
  Filled 2022-05-31 – 2022-06-02 (×2): qty 90, fill #0
  Filled 2022-06-10: qty 90, 90d supply, fill #0

## 2022-06-02 ENCOUNTER — Other Ambulatory Visit: Payer: Self-pay

## 2022-06-08 ENCOUNTER — Other Ambulatory Visit: Payer: Self-pay

## 2022-06-10 ENCOUNTER — Other Ambulatory Visit: Payer: Self-pay

## 2022-07-02 ENCOUNTER — Encounter (HOSPITAL_COMMUNITY): Payer: Self-pay | Admitting: Emergency Medicine

## 2022-07-02 ENCOUNTER — Ambulatory Visit (INDEPENDENT_AMBULATORY_CARE_PROVIDER_SITE_OTHER): Payer: 59

## 2022-07-02 ENCOUNTER — Ambulatory Visit (HOSPITAL_COMMUNITY)
Admission: EM | Admit: 2022-07-02 | Discharge: 2022-07-02 | Disposition: A | Discharge status: Home / Self Care | Payer: 59 | Attending: Physician Assistant | Admitting: Physician Assistant

## 2022-07-02 DIAGNOSIS — R222 Localized swelling, mass and lump, trunk: Secondary | ICD-10-CM | POA: Insufficient documentation

## 2022-07-02 DIAGNOSIS — R0789 Other chest pain: Secondary | ICD-10-CM | POA: Diagnosis not present

## 2022-07-02 DIAGNOSIS — R3915 Urgency of urination: Secondary | ICD-10-CM | POA: Insufficient documentation

## 2022-07-02 DIAGNOSIS — N644 Mastodynia: Secondary | ICD-10-CM | POA: Diagnosis present

## 2022-07-02 LAB — POCT URINALYSIS DIPSTICK, ED / UC
Bilirubin Urine: NEGATIVE
Glucose, UA: NEGATIVE mg/dL
Ketones, ur: NEGATIVE mg/dL
Leukocytes,Ua: NEGATIVE
Nitrite: NEGATIVE
Protein, ur: NEGATIVE mg/dL
Specific Gravity, Urine: 1.015 (ref 1.005–1.030)
Urobilinogen, UA: 0.2 mg/dL (ref 0.0–1.0)
pH: 6.5 (ref 5.0–8.0)

## 2022-07-02 NOTE — ED Triage Notes (Signed)
Patient c/o right nodule under right breast x 2 months.  The nodule comes and goes, recently reappeared and is painful.  Patient has taken Excedrin, Aleve, and Tylenol PM for pain.

## 2022-07-02 NOTE — ED Provider Notes (Signed)
[facility]    CSN: [id number] Arrival date & time: 07/02/22  1344      History   Chief Complaint Chief Complaint  Patient presents with   Breast Pain    HPI Monica Brennan is a 46 y.o. female.   Patient presents today with a several month history of painful nodule on her lateral right breast/chest wall.  She denies any swelling or drainage from the lesion.  She reports that this has become more problematic and interferes with her ability to wear a normal bra.  She has been trying over-the-counter medications without improvement of symptoms.  She denies any history of fibrocystic breast disease.  Has not noticed any association with her menstrual cycle.  She denies any nipple discharge, fevers, weight loss, nausea, vomiting.  Denies any family history of breast disorders including breast cancer.  She does get her mammograms regularly with last one 05/26/2021 without concerning findings.  She denies any injury or change in activity prior to symptom onset.  In addition, patient reports some urinary urgency.  This has been ongoing for approximately 10 days.  She is interested in having her urine checked today.  She denies any recent catheterization, urogenital procedure, history of nephrolithiasis.  She denies any recent antibiotic use.  Denies any abdominal pain, pelvic pain, fever, nausea, vomiting.  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    Past Medical History:  Diagnosis Date   Hypertension Dx 2006   previously, on lisinopril. stopped lisinopril when medicaid ran out in 2007.    Morbid obesity ([place])    Prediabetes     Patient Active Problem List   Diagnosis Date Noted   Tinea cruris 01/20/2017   Fibroid uterus 06/24/2016   Lipoma of back 06/16/2016   Right-sided low back pain without sciatica 06/16/2016   Pelvic pain in female 06/16/2016   Menopausal symptoms  08/28/2015   Hypertension 09/09/2014    Past Surgical History:  Procedure Laterality Date   CESAREAN SECTION     x 3, 1998,2000, 2006   COLONOSCOPY     DILATION AND CURETTAGE, DIAGNOSTIC / THERAPEUTIC     due to miscarriage   FOOT SURGERY Left 04/2021   Bone spur   IR ANGIOGRAM PELVIS SELECTIVE OR SUPRASELECTIVE  11/03/2021   IR ANGIOGRAM PELVIS SELECTIVE OR SUPRASELECTIVE  11/03/2021   IR ANGIOGRAM SELECTIVE EACH ADDITIONAL VESSEL  11/03/2021   IR ANGIOGRAM SELECTIVE EACH ADDITIONAL VESSEL  11/03/2021   IR EMBO TUMOR ORGAN ISCHEMIA INFARCT INC GUIDE ROADMAPPING  11/03/2021   IR RADIOLOGIST EVAL & MGMT  09/22/2021   IR RADIOLOGIST EVAL & MGMT  11/18/2021   IR RADIOLOGIST EVAL & MGMT  03/03/2022   IR US GUIDE VASC ACCESS LEFT  11/03/2021   TUBAL LIGATION  12/05/2004    OB History     Gravida  4   Para  3   Term  2   Preterm  1   AB  1   Living  3      SAB  1   IAB      Ectopic      Multiple      Live Births  3            Home Medications    Prior to Admission medications   Medication Sig Start Date End Date Taking? Authorizing Provider  amLODipine (NORVASC) 10 MG tablet  TAKE 1 TABLET (10 MG TOTAL) BY MOUTH DAILY 05/31/22  Yes Gildardo Pounds, NP  cyclobenzaprine (FLEXERIL) 10 MG tablet TAKE 1 TABLET BY MOUTH 3 (THREE) TIMES DAILY AS NEEDED FOR MUSCLE SPASMS. 05/31/22  Yes Gildardo Pounds, NP  losartan (COZAAR) 25 MG tablet TAKE 1 TABLET (25 MG TOTAL) BY MOUTH DAILY. 05/31/22  Yes Gildardo Pounds, NP  nystatin cream (MYCOSTATIN) Apply 1 Application topically 2 (two) times daily. To breast area and groin 05/31/22  Yes Gildardo Pounds, NP  triamcinolone (KENALOG) 0.025 % ointment APPLY 1 APPLICATION TOPICALLY 2 (TWO) TIMES DAILY. 05/31/22 05/31/23 Yes Gildardo Pounds, NP    Family History Family History  Problem Relation Age of Onset   Hypertension Mother    Heart disease Mother    Sleep apnea Mother    Depression Mother    Hypertension Father     Hypertension Sister    Anxiety disorder Sister    Depression Sister    Hypertension Sister    Anxiety disorder Sister    Depression Sister    Diabetes Brother    Hypertension Brother    Anxiety disorder Brother    Depression Brother    Diabetes Brother    Hypertension Brother    Depression Brother    Diabetes Maternal Aunt    Cancer Maternal Aunt        lung    Hypertension Maternal Grandmother    ADD / ADHD Daughter    Depression Daughter    Diabetes Son    Thyroid disease Son    ADD / ADHD Son    Epilepsy Son    Autism Son    Heart defect Nephew    Colon cancer Neg Hx    Esophageal cancer Neg Hx    Rectal cancer Neg Hx    Stomach cancer Neg Hx     Social History Social History   Tobacco Use   Smoking status: Never   Smokeless tobacco: Never  Vaping Use   Vaping Use: Never used  Substance Use Topics   Alcohol use: No   Drug use: No     Allergies   Patient has no known allergies.   Review of Systems Review of Systems  Constitutional:  Negative for activity change, appetite change, fatigue and fever.  Respiratory:  Negative for cough and shortness of breath.   Cardiovascular:  Negative for chest pain.  Gastrointestinal:  Negative for abdominal pain, diarrhea, nausea and vomiting.  Genitourinary:  Positive for urgency. Negative for dysuria, frequency, vaginal bleeding, vaginal discharge and vaginal pain.     Physical Exam Triage Vital Signs ED Triage Vitals  Enc Vitals Group     BP 07/02/22 1419 140/86     Pulse Rate 07/02/22 1419 77     Resp 07/02/22 1419 18     Temp 07/02/22 1419 98.4 F (36.9 C)     Temp Source 07/02/22 1419 Oral     SpO2 07/02/22 1419 99 %     Weight 07/02/22 1421 223 lb (101.2 kg)     Height 07/02/22 1421 '5\' 3"'$  (1.6 m)     Head Circumference --      Peak Flow --      Pain Score 07/02/22 1421 7     Pain Loc --      Pain Edu? --      Excl. in [place]? --    No data found.  Updated Vital Signs BP 140/86 (BP Location: Right  Arm)  Pulse 77   Temp 98.4 F (36.9 C) (Oral)   Resp 18   Ht '5\' 3"'$  (1.6 m)   Wt 223 lb (101.2 kg)   LMP 05/22/2022   SpO2 99%   BMI 39.50 kg/m   Visual Acuity Right Eye Distance:   Left Eye Distance:   Bilateral Distance:    Right Eye Near:   Left Eye Near:    Bilateral Near:     Physical Exam Vitals reviewed.  Constitutional:      General: She is awake. She is not in acute distress.    Appearance: Normal appearance. She is well-developed. She is not ill-appearing.     Comments: Very pleasant female appears stated age in no acute distress sitting comfortably in exam room  HENT:     Head: Normocephalic and atraumatic.     Mouth/Throat:     Pharynx: Uvula midline. No oropharyngeal exudate or posterior oropharyngeal erythema.  Cardiovascular:     Rate and Rhythm: Normal rate and regular rhythm.     Heart sounds: Normal heart sounds, S1 normal and S2 normal. No murmur heard. Pulmonary:     Effort: Pulmonary effort is normal.     Breath sounds: Normal breath sounds. No wheezing, rhonchi or rales.     Comments: Clear to auscultation bilaterally Chest:     Chest wall: Tenderness present. No deformity or swelling.  Breasts:    Right: Mass and tenderness present. No swelling, bleeding, inverted nipple, nipple discharge or skin change.     Left: No swelling, bleeding, inverted nipple, mass, nipple discharge, skin change or tenderness.       Comments: 1 cm x 1 cm painful fixed nodule noted lateral breast at chest wall.  No skin or nipple changes.  No additional lesions noted. Abdominal:     General: Bowel sounds are normal.     Palpations: Abdomen is soft.     Tenderness: There is no abdominal tenderness. There is no right CVA tenderness, left CVA tenderness, guarding or rebound.     Comments: Benign abdominal exam  Psychiatric:        Behavior: Behavior is cooperative.      UC Treatments / Results  Labs (all labs ordered are listed, but only abnormal results are  displayed) Labs Reviewed  POCT URINALYSIS DIPSTICK, ED / UC - Abnormal; Notable for the following components:      Result Value   Hgb urine dipstick TRACE (*)    All other components within normal limits  URINE CULTURE    EKG   Radiology DG Ribs Unilateral W/Chest Right  Result Date: 07/02/2022 CLINICAL DATA:  Right chest pain EXAM: RIGHT RIBS AND CHEST - 3+ VIEW COMPARISON:  03/27/2016 FINDINGS: Cardiac size is within normal limits. Lung fields are clear of any infiltrates or pulmonary edema. There is no pleural effusion or pneumothorax. There is patient motion in some of the images limiting evaluation of the right ribs. As far as seen, no displaced fractures are noted in the right ribs. IMPRESSION: Motion limited study. No demonstrable displaced fracture is seen in right ribs. No focal pulmonary infiltrates are seen. There is no pleural effusion or pneumothorax. Electronically Signed   By: Elmer Picker M.D.   On: 07/02/2022 15:06    Procedures Procedures (including critical care time)  Medications Ordered in UC Medications - No data to display  Initial Impression / Assessment and Plan / UC Course  I have reviewed the triage vital signs and the nursing notes.  Pertinent labs & imaging results that were available during my care of the patient were reviewed by me and considered in my medical decision making (see chart for details).     Unclear etiology of symptoms.  Nodule does not feel like a cyst or abscess.  X-ray was obtained given symptoms are along inferior portion of breast/chest wall which no acute abnormality.  Stat order for diagnostic mammogram and ultrasound was placed in epic and patient was encouraged to follow-up with [place] if she has not been contacted to schedule this within a few days.  She is to follow-up with her primary care for further evaluation management.  She can continue over-the-counter medication for pain relief.  Discussed that if she has any worsening  symptoms she needs to return for reevaluation.  UA was obtained showed trace blood.  Given her symptoms we will send this for culture but defer antibiotics for the time being.  Patient reports that symptoms began soon after she had a OB/GYN procedure.  Recommend she follow-up with her OB/GYN for further evaluation and management.  If anything worsens she is to return for reevaluation.  Final Clinical Impressions(s) / UC Diagnoses   Final diagnoses:  Nodule of right anterior chest wall  Breast pain, right  Urinary urgency     Discharge Instructions      Your chest x-ray was normal.  I have ordered an ultrasound and a mammogram.  Someone should contact you to schedule this but if you do not hear from them within a few days please contact me so I can check on it.  You can continue using over-the-counter medications for symptom management.  If anything changes please return for reevaluation.  Your urine did have a little bit of blood.  We will send this off for culture.  Make sure you are pushing fluids.  Follow-up with your OB/GYN as we discussed.     ED Prescriptions   None    PDMP not reviewed this encounter.   Terrilee Croak, PA-C 07/02/22 1614

## 2022-07-02 NOTE — Discharge Instructions (Signed)
Your chest x-ray was normal.  I have ordered an ultrasound and a mammogram.  Someone should contact you to schedule this but if you do not hear from them within a few days please contact me so I can check on it.  You can continue using over-the-counter medications for symptom management.  If anything changes please return for reevaluation.  Your urine did have a little bit of blood.  We will send this off for culture.  Make sure you are pushing fluids.  Follow-up with your OB/GYN as we discussed.

## 2022-07-03 LAB — URINE CULTURE: Culture: NO GROWTH

## 2022-07-04 ENCOUNTER — Other Ambulatory Visit: Payer: Self-pay

## 2022-07-22 ENCOUNTER — Ambulatory Visit
Admission: RE | Admit: 2022-07-22 | Discharge: 2022-07-22 | Disposition: A | Discharge status: Home / Self Care | Payer: Commercial Managed Care - HMO | Source: Ambulatory Visit | Attending: Physician Assistant | Admitting: Physician Assistant

## 2022-07-22 ENCOUNTER — Other Ambulatory Visit: Payer: 59

## 2022-07-29 ENCOUNTER — Other Ambulatory Visit: Payer: Self-pay | Admitting: Interventional Radiology

## 2022-07-29 DIAGNOSIS — D250 Submucous leiomyoma of uterus: Secondary | ICD-10-CM

## 2022-08-17 ENCOUNTER — Ambulatory Visit: Payer: 59 | Admitting: Physician Assistant

## 2022-08-26 ENCOUNTER — Ambulatory Visit (HOSPITAL_BASED_OUTPATIENT_CLINIC_OR_DEPARTMENT_OTHER): Payer: 59 | Admitting: Obstetrics & Gynecology

## 2022-09-02 ENCOUNTER — Encounter: Payer: Self-pay | Admitting: Interventional Radiology

## 2022-09-08 ENCOUNTER — Other Ambulatory Visit: Payer: Self-pay

## 2022-09-08 ENCOUNTER — Other Ambulatory Visit: Payer: Self-pay | Admitting: Nurse Practitioner

## 2022-09-08 DIAGNOSIS — I10 Essential (primary) hypertension: Secondary | ICD-10-CM

## 2022-09-08 DIAGNOSIS — G8929 Other chronic pain: Secondary | ICD-10-CM

## 2022-09-08 MED ORDER — AMLODIPINE BESYLATE 10 MG PO TABS
10.0000 mg | ORAL_TABLET | Freq: Every day | ORAL | 0 refills | Status: DC
  Filled 2022-09-08: qty 30, 30d supply, fill #0
  Filled 2022-10-17: qty 30, 30d supply, fill #1

## 2022-09-08 MED ORDER — LOSARTAN POTASSIUM 25 MG PO TABS
25.0000 mg | ORAL_TABLET | Freq: Every day | ORAL | 0 refills | Status: DC
  Filled 2022-09-08: qty 30, 30d supply, fill #0
  Filled 2022-10-17: qty 30, 30d supply, fill #1

## 2022-09-08 MED ORDER — CYCLOBENZAPRINE HCL 10 MG PO TABS
ORAL_TABLET | ORAL | 2 refills | Status: DC
  Filled 2022-09-08: qty 60, 20d supply, fill #0
  Filled 2022-10-17: qty 60, 20d supply, fill #1

## 2022-09-08 NOTE — Telephone Encounter (Signed)
Medication Refill - Medication: amLODipine (NORVASC) 10 MG tablet, losartan (COZAAR) 25 MG tablet, cyclobenzaprine (FLEXERIL) 10 MG tablet  Has the patient contacted their pharmacy? No. (Agent: If no, request that the patient contact the pharmacy for the refill. If patient does not wish to contact the pharmacy document the reason why and proceed with request.)   Preferred Pharmacy (with phone number or street name):  [facility] [address], [place] [address]  Phone: [phone] Fax: [phone]  Hours: M-F 7:30a-6:00p   Has the patient been seen for an appointment in the last year OR does the patient have an upcoming appointment? Yes.   Pt is scheduled for first available with PCP .   Agent: Please be advised that RX refills may take up to 3 business days. We ask that you follow-up with your pharmacy.

## 2022-09-08 NOTE — Telephone Encounter (Signed)
Requested Prescriptions  Pending Prescriptions Disp Refills  . amLODipine (NORVASC) 10 MG tablet 90 tablet 0    Sig: TAKE 1 TABLET (10 MG TOTAL) BY MOUTH DAILY     Cardiovascular: Calcium Channel Blockers 2 Failed - 09/08/2022 11:16 AM      Failed - Last BP in normal range    BP Readings from Last 1 Encounters:  07/02/22 140/86         Failed - Valid encounter within last 6 months    Recent Outpatient Visits          3 months ago Tinea cruris   [place], Vernia Buff, NP   6 months ago Primary hypertension   [place], Vernia Buff, NP   8 months ago Primary hypertension   [place], Vernia Buff, NP   1 year ago Primary hypertension   [place], Jarome Matin, RPH-CPP   1 year ago Encounter for Papanicolaou smear for cervical cancer screening   [place], Vernia Buff, NP      Future Appointments            In 1 month Gildardo Pounds, NP [place] in normal range    Pulse Readings from Last 1 Encounters:  07/02/22 77         . losartan (COZAAR) 25 MG tablet 90 tablet 0    Sig: TAKE 1 TABLET (25 MG TOTAL) BY MOUTH DAILY.     Cardiovascular:  Angiotensin Receptor Blockers Failed - 09/08/2022 11:16 AM      Failed - Cr in normal range and within 180 days    Creat  Date Value Ref Range Status  09/09/2014 0.89 0.50 - 1.10 mg/dL Final   Creatinine, Ser  Date Value Ref Range Status  02/18/2022 0.87 0.57 - 1.00 mg/dL Final         Failed - K in normal range and within 180 days    Potassium  Date Value Ref Range Status  02/18/2022 4.7 3.5 - 5.2 mmol/L Final         Failed - Last BP in normal range    BP Readings from Last 1 Encounters:  07/02/22 140/86         Failed - Valid encounter within last 6 months    Recent  Outpatient Visits          3 months ago Tinea cruris   [place], Vernia Buff, NP   6 months ago Primary hypertension   [place], Vernia Buff, NP   8 months ago Primary hypertension   [place], Vernia Buff, NP   1 year ago Primary hypertension   [place], RPH-CPP   1 year ago Encounter for Papanicolaou smear for cervical cancer screening   [place], Zelda W, NP      Future Appointments            In 1 month Gildardo Pounds, NP [place] - Patient is not pregnant      .  cyclobenzaprine (FLEXERIL) 10 MG tablet 60 tablet 2    Sig: TAKE 1 TABLET BY MOUTH 3 (THREE) TIMES DAILY AS NEEDED FOR MUSCLE SPASMS.     Not Delegated - Analgesics:  Muscle Relaxants Failed - 09/08/2022 11:16 AM      Failed - This refill cannot be delegated      Failed - Valid encounter within last 6 months    Recent Outpatient Visits          3 months ago Tinea cruris   [place], Vernia Buff, NP   6 months ago Primary hypertension   [place], Vernia Buff, NP   8 months ago Primary hypertension   [place], Vernia Buff, NP   1 year ago Primary hypertension   [place], RPH-CPP   1 year ago Encounter for Papanicolaou smear for cervical cancer screening   [place], Zelda W, NP      Future Appointments            In 1 month Gildardo Pounds, NP [place]

## 2022-09-08 NOTE — Telephone Encounter (Signed)
Requested medication (s) are due for refill today - yes  Requested medication (s) are on the active medication list -yes  Future visit scheduled -yes  Last refill: 05/31/22 #60  Notes to clinic: non delegated Rx  Requested Prescriptions  Pending Prescriptions Disp Refills   cyclobenzaprine (FLEXERIL) 10 MG tablet 60 tablet 2    Sig: TAKE 1 TABLET BY MOUTH 3 (THREE) TIMES DAILY AS NEEDED FOR MUSCLE SPASMS.     Not Delegated - Analgesics:  Muscle Relaxants Failed - 09/08/2022 11:16 AM      Failed - This refill cannot be delegated      Failed - Valid encounter within last 6 months    Recent Outpatient Visits           3 months ago Tinea cruris   [place], Vernia Buff, NP   6 months ago Primary hypertension   [place], Vernia Buff, NP   8 months ago Primary hypertension   [place], Vernia Buff, NP   1 year ago Primary hypertension   [place], [place], RPH-CPP   1 year ago Encounter for Papanicolaou smear for cervical cancer screening   [place], Vernia Buff, NP       Future Appointments             In 1 month Gildardo Pounds, NP [place]            Signed Prescriptions Disp Refills   amLODipine (NORVASC) 10 MG tablet 90 tablet 0    Sig: TAKE 1 TABLET (10 MG TOTAL) BY MOUTH DAILY     Cardiovascular: Calcium Channel Blockers 2 Failed - 09/08/2022 11:16 AM      Failed - Last BP in normal range    BP Readings from Last 1 Encounters:  07/02/22 140/86         Failed - Valid encounter within last 6 months    Recent Outpatient Visits           3 months ago Tinea cruris   [place], Vernia Buff, NP   6 months ago Primary hypertension   [place], Vernia Buff, NP   8 months  ago Primary hypertension   [place], Vernia Buff, NP   1 year ago Primary hypertension   [place], RPH-CPP   1 year ago Encounter for Papanicolaou smear for cervical cancer screening   [place], Vernia Buff, NP       Future Appointments             In 1 month Gildardo Pounds, NP [place] in normal range    Pulse Readings from Last 1 Encounters:  07/02/22 77          losartan (COZAAR) 25 MG tablet 90 tablet 0    Sig: TAKE 1 TABLET (25 MG TOTAL) BY MOUTH DAILY.     Cardiovascular:  Angiotensin Receptor Blockers Failed - 09/08/2022 11:16 AM      Failed - Cr in normal range and within 180 days    Creat  Date Value Ref Range Status  09/09/2014 0.89 0.50 - 1.10 mg/dL Final   Creatinine, Ser  Date Value Ref Range Status  02/18/2022 0.87 0.57 - 1.00 mg/dL Final         Failed - K in normal range and within 180 days    Potassium  Date Value Ref Range Status  02/18/2022 4.7 3.5 - 5.2 mmol/L Final         Failed - Last BP in normal range    BP Readings from Last 1 Encounters:  07/02/22 140/86         Failed - Valid encounter within last 6 months    Recent Outpatient Visits           3 months ago Tinea cruris   [place], Vernia Buff, NP   6 months ago Primary hypertension   [place], Vernia Buff, NP   8 months ago Primary hypertension   [place], Vernia Buff, NP   1 year ago Primary hypertension   [place], Jarome Matin, RPH-CPP   1 year ago Encounter for Papanicolaou smear for cervical cancer screening   [place], Vernia Buff, NP       Future Appointments             In 1  month Gildardo Pounds, NP [place] - Patient is not pregnant         Requested Prescriptions  Pending Prescriptions Disp Refills   cyclobenzaprine (FLEXERIL) 10 MG tablet 60 tablet 2    Sig: TAKE 1 TABLET BY MOUTH 3 (THREE) TIMES DAILY AS NEEDED FOR MUSCLE SPASMS.     Not Delegated - Analgesics:  Muscle Relaxants Failed - 09/08/2022 11:16 AM      Failed - This refill cannot be delegated      Failed - Valid encounter within last 6 months    Recent Outpatient Visits           3 months ago Tinea cruris   [place], Vernia Buff, NP   6 months ago Primary hypertension   [place], Vernia Buff, NP   8 months ago Primary hypertension   [place], Vernia Buff, NP   1 year ago Primary hypertension   [place], [place], RPH-CPP   1 year ago Encounter for Papanicolaou smear for cervical cancer screening   [place], Vernia Buff, NP       Future Appointments             In 1 month Gildardo Pounds, NP [place]            Signed Prescriptions Disp Refills   amLODipine (NORVASC) 10 MG tablet 90 tablet 0    Sig: TAKE 1 TABLET (10 MG TOTAL) BY MOUTH DAILY     Cardiovascular: Calcium Channel Blockers 2 Failed - 09/08/2022 11:16 AM      Failed - Last BP in normal range    BP Readings from Last 1 Encounters:  07/02/22 140/86         Failed - Valid encounter within last  6 months    Recent Outpatient Visits           3 months ago Tinea cruris   [place], Vernia Buff, NP   6 months ago Primary hypertension   [place], Vernia Buff, NP   8 months ago Primary hypertension   [place], Vernia Buff,  NP   1 year ago Primary hypertension   [place], Jarome Matin, RPH-CPP   1 year ago Encounter for Papanicolaou smear for cervical cancer screening   [place], Zelda W, NP       Future Appointments             In 1 month Gildardo Pounds, NP [place] in normal range    Pulse Readings from Last 1 Encounters:  07/02/22 77          losartan (COZAAR) 25 MG tablet 90 tablet 0    Sig: TAKE 1 TABLET (25 MG TOTAL) BY MOUTH DAILY.     Cardiovascular:  Angiotensin Receptor Blockers Failed - 09/08/2022 11:16 AM      Failed - Cr in normal range and within 180 days    Creat  Date Value Ref Range Status  09/09/2014 0.89 0.50 - 1.10 mg/dL Final   Creatinine, Ser  Date Value Ref Range Status  02/18/2022 0.87 0.57 - 1.00 mg/dL Final         Failed - K in normal range and within 180 days    Potassium  Date Value Ref Range Status  02/18/2022 4.7 3.5 - 5.2 mmol/L Final         Failed - Last BP in normal range    BP Readings from Last 1 Encounters:  07/02/22 140/86         Failed - Valid encounter within last 6 months    Recent Outpatient Visits           3 months ago Tinea cruris   [place], Vernia Buff, NP   6 months ago Primary hypertension   [place], Vernia Buff, NP   8 months ago Primary hypertension   [place], Vernia Buff, NP   1 year ago Primary hypertension   [place], RPH-CPP   1 year ago Encounter for Papanicolaou smear for cervical cancer screening   [place], Zelda W, NP       Future Appointments             In 1 month Gildardo Pounds, NP [place]  - Patient is not pregnant

## 2022-09-09 ENCOUNTER — Other Ambulatory Visit: Payer: Self-pay

## 2022-10-13 ENCOUNTER — Ambulatory Visit: Payer: Commercial Managed Care - HMO | Admitting: Physician Assistant

## 2022-10-17 ENCOUNTER — Other Ambulatory Visit: Payer: Self-pay | Admitting: Nurse Practitioner

## 2022-10-17 DIAGNOSIS — L209 Atopic dermatitis, unspecified: Secondary | ICD-10-CM

## 2022-10-17 DIAGNOSIS — B356 Tinea cruris: Secondary | ICD-10-CM

## 2022-10-18 ENCOUNTER — Other Ambulatory Visit: Payer: Self-pay

## 2022-10-18 ENCOUNTER — Ambulatory Visit: Payer: Commercial Managed Care - HMO | Attending: Nurse Practitioner | Admitting: Nurse Practitioner

## 2022-10-18 ENCOUNTER — Encounter: Payer: Self-pay | Admitting: Nurse Practitioner

## 2022-10-18 ENCOUNTER — Other Ambulatory Visit (HOSPITAL_COMMUNITY)
Admission: RE | Admit: 2022-10-18 | Discharge: 2022-10-18 | Disposition: A | Discharge status: Home / Self Care | Payer: Commercial Managed Care - HMO | Source: Ambulatory Visit | Attending: Nurse Practitioner | Admitting: Nurse Practitioner

## 2022-10-18 VITALS — BP 125/84 | HR 80 | Temp 98.0°F | Ht 63.0 in | Wt 243.8 lb

## 2022-10-18 DIAGNOSIS — Z0001 Encounter for general adult medical examination with abnormal findings: Secondary | ICD-10-CM

## 2022-10-18 DIAGNOSIS — G8929 Other chronic pain: Secondary | ICD-10-CM | POA: Diagnosis not present

## 2022-10-18 DIAGNOSIS — I10 Essential (primary) hypertension: Secondary | ICD-10-CM

## 2022-10-18 DIAGNOSIS — R7989 Other specified abnormal findings of blood chemistry: Secondary | ICD-10-CM

## 2022-10-18 DIAGNOSIS — R7303 Prediabetes: Secondary | ICD-10-CM

## 2022-10-18 DIAGNOSIS — E6609 Other obesity due to excess calories: Secondary | ICD-10-CM

## 2022-10-18 DIAGNOSIS — M545 Low back pain, unspecified: Secondary | ICD-10-CM | POA: Insufficient documentation

## 2022-10-18 DIAGNOSIS — Z0000 Encounter for general adult medical examination without abnormal findings: Secondary | ICD-10-CM

## 2022-10-18 DIAGNOSIS — Z6841 Body Mass Index (BMI) 40.0 and over, adult: Secondary | ICD-10-CM

## 2022-10-18 MED ORDER — TRIAMCINOLONE ACETONIDE 0.025 % EX OINT
TOPICAL_OINTMENT | Freq: Two times a day (BID) | CUTANEOUS | 0 refills | Status: DC
  Filled 2022-10-18: qty 60, 30d supply, fill #0

## 2022-10-18 MED ORDER — AMLODIPINE BESYLATE 10 MG PO TABS
10.0000 mg | ORAL_TABLET | Freq: Every day | ORAL | 1 refills | Status: DC
  Filled 2022-11-29: qty 30, 30d supply, fill #0
  Filled 2023-01-03: qty 30, 30d supply, fill #1
  Filled 2023-02-13: qty 30, 30d supply, fill #2
  Filled 2023-03-15: qty 30, 30d supply, fill #3
  Filled 2023-04-12: qty 30, 30d supply, fill #4
  Filled 2023-05-23: qty 30, 30d supply, fill #5

## 2022-10-18 MED ORDER — CYCLOBENZAPRINE HCL 10 MG PO TABS
10.0000 mg | ORAL_TABLET | Freq: Three times a day (TID) | ORAL | 2 refills | Status: DC | PRN
  Filled 2022-10-18 – 2022-11-29 (×2): qty 90, 30d supply, fill #0
  Filled 2023-01-03: qty 90, 30d supply, fill #1
  Filled 2023-02-13: qty 90, 30d supply, fill #2

## 2022-10-18 MED ORDER — NYSTATIN 100000 UNIT/GM EX CREA
1.0000 | TOPICAL_CREAM | Freq: Two times a day (BID) | CUTANEOUS | 0 refills | Status: DC
  Filled 2022-10-18: qty 60, 30d supply, fill #0

## 2022-10-18 MED ORDER — LOSARTAN POTASSIUM 25 MG PO TABS
25.0000 mg | ORAL_TABLET | Freq: Every day | ORAL | 1 refills | Status: DC
  Filled 2022-11-29: qty 30, 30d supply, fill #0
  Filled 2023-01-03: qty 30, 30d supply, fill #1
  Filled 2023-02-13: qty 30, 30d supply, fill #2
  Filled 2023-03-15: qty 30, 30d supply, fill #3
  Filled 2023-04-12: qty 30, 30d supply, fill #4
  Filled 2023-05-23: qty 30, 30d supply, fill #5

## 2022-10-18 NOTE — Telephone Encounter (Signed)
Requested medication (s) are due for refill today: yes  Requested medication (s) are on the active medication list: yes  Last refill:  05/31/22  Future visit scheduled: yes  Notes to clinic:  Unable to refill per protocol, cannot delegate.      Requested Prescriptions  Pending Prescriptions Disp Refills   nystatin cream (MYCOSTATIN) 60 g 1    Sig: Apply 1 Application topically 2 (two) times daily. To breast area and groin     Off-Protocol Failed - 10/17/2022  6:07 PM      Failed - Medication not assigned to a protocol, review manually.      Passed - Valid encounter within last 12 months    Recent Outpatient Visits           Today Chronic right-sided low back pain without sciatica   [place], Vernia Buff, NP   4 months ago Tinea cruris   [place], Vernia Buff, NP   8 months ago Primary hypertension   [place], Vernia Buff, NP   10 months ago Primary hypertension   [place], Vernia Buff, NP   1 year ago Primary hypertension   [place], Annie Main L, RPH-CPP               triamcinolone (KENALOG) 0.025 % ointment 60 g 2    Sig: APPLY 1 APPLICATION TOPICALLY 2 (TWO) TIMES DAILY.     Not Delegated - Dermatology:  Corticosteroids Failed - 10/17/2022  6:07 PM      Failed - This refill cannot be delegated      Passed - Valid encounter within last 12 months    Recent Outpatient Visits           Today Chronic right-sided low back pain without sciatica   [place] Gildardo Pounds, NP   4 months ago Tinea cruris   [place], Vernia Buff, NP   8 months ago Primary hypertension   [place], Vernia Buff, NP   10 months ago Primary hypertension   [place], Vernia Buff, NP   1 year ago Primary hypertension   [place], RPH-CPP

## 2022-10-18 NOTE — Progress Notes (Signed)
Assessment & Plan:  Monica Brennan was seen today for annual exam.  Diagnoses and all orders for this visit:  Encounter for annual physical exam  Primary hypertension -     amLODipine (NORVASC) 10 MG tablet; Take 1 tablet (10 mg total) by mouth daily. Please fill as a 90 day supply -     losartan (COZAAR) 25 MG tablet; Take 1 tablet (25 mg total) by mouth daily. Please fill as a 90 day supply -     CMP14+EGFR  Chronic right-sided low back pain without sciatica -     cyclobenzaprine (FLEXERIL) 10 MG tablet; Take 1 tablet (10 mg total) by mouth 3 (three) times daily as needed for muscle spasms. Please fill as a 90 day supply  Prediabetes -     Hemoglobin A1c -     Lipid panel  Abnormal CBC -     CBC with Differential    Patient has been counseled on age-appropriate routine health concerns for screening and prevention. These are reviewed and up-to-date. Referrals have been placed accordingly. Immunizations are up-to-date or declined.    Subjective:   Chief Complaint  Patient presents with   Annual Exam   HPI Monica Brennan 46 y.o. female presents to office today for annual physical exam.  She is dealing with a lot of family stressors at this time.  Weight is up about 10 pounds.  Blood pressure is at goal with amlodipine 10 mg daily and losartan 25 mg daily.  All medications were refilled today.  She is up-to-date on her mammogram, colonoscopy and Pap smear. BP Readings from Last 3 Encounters:  10/18/22 125/84  07/02/22 140/86  02/18/22 125/88     Review of Systems  Constitutional:  Negative for fever, malaise/fatigue and weight loss.  HENT: Negative.  Negative for nosebleeds.   Eyes: Negative.  Negative for blurred vision, double vision and photophobia.  Respiratory: Negative.  Negative for cough and shortness of breath.   Cardiovascular: Negative.  Negative for chest pain, palpitations and leg swelling.  Gastrointestinal: Negative.  Negative for heartburn, nausea and  vomiting.  Genitourinary: Negative.   Musculoskeletal: Negative.  Negative for myalgias.  Skin: Negative.   Neurological: Negative.  Negative for dizziness, focal weakness, seizures and headaches.  Endo/Heme/Allergies: Negative.   Psychiatric/Behavioral: Negative.  Negative for suicidal ideas.     Past Medical History:  Diagnosis Date   Hypertension Dx 2006   previously, on lisinopril. stopped lisinopril when medicaid ran out in 2007.    Morbid obesity ([place])    Prediabetes     Past Surgical History:  Procedure Laterality Date   CESAREAN SECTION     x 3, 1998,2000, 2006   COLONOSCOPY     DILATION AND CURETTAGE, DIAGNOSTIC / THERAPEUTIC     due to miscarriage   FOOT SURGERY Left 04/2021   Bone spur   IR ANGIOGRAM PELVIS SELECTIVE OR SUPRASELECTIVE  11/03/2021   IR ANGIOGRAM PELVIS SELECTIVE OR SUPRASELECTIVE  11/03/2021   IR ANGIOGRAM SELECTIVE EACH ADDITIONAL VESSEL  11/03/2021   IR ANGIOGRAM SELECTIVE EACH ADDITIONAL VESSEL  11/03/2021   IR EMBO TUMOR ORGAN ISCHEMIA INFARCT INC GUIDE ROADMAPPING  11/03/2021   IR RADIOLOGIST EVAL & MGMT  09/22/2021   IR RADIOLOGIST EVAL & MGMT  11/18/2021   IR RADIOLOGIST EVAL & MGMT  03/03/2022   IR US GUIDE VASC ACCESS LEFT  11/03/2021   TUBAL LIGATION  12/05/2004    Family History  Problem Relation Age of Onset   Hypertension  Mother    Heart disease Mother    Sleep apnea Mother    Depression Mother    Hypertension Father    Hypertension Sister    Anxiety disorder Sister    Depression Sister    Hypertension Sister    Anxiety disorder Sister    Depression Sister    Diabetes Brother    Hypertension Brother    Anxiety disorder Brother    Depression Brother    Diabetes Brother    Hypertension Brother    Depression Brother    Diabetes Maternal Aunt    Cancer Maternal Aunt        lung    Hypertension Maternal Grandmother    ADD / ADHD Daughter    Depression Daughter    Diabetes Son    Thyroid disease Son    ADD / ADHD Son     Epilepsy Son    Autism Son    Heart defect Nephew    Colon cancer Neg Hx    Esophageal cancer Neg Hx    Rectal cancer Neg Hx    Stomach cancer Neg Hx     Social History Reviewed with no changes to be made today.   Outpatient Medications Prior to Visit  Medication Sig Dispense Refill   nystatin cream (MYCOSTATIN) Apply 1 Application topically 2 (two) times daily. To breast area and groin 60 g 1   triamcinolone (KENALOG) 0.025 % ointment APPLY 1 APPLICATION TOPICALLY 2 (TWO) TIMES DAILY. 60 g 2   amLODipine (NORVASC) 10 MG tablet Take 1 tablet (10 mg total) by mouth daily. 90 tablet 0   cyclobenzaprine (FLEXERIL) 10 MG tablet TAKE 1 TABLET BY MOUTH 3 (THREE) TIMES DAILY AS NEEDED FOR MUSCLE SPASMS. 60 tablet 2   losartan (COZAAR) 25 MG tablet Take 1 tablet (25 mg total) by mouth daily. 90 tablet 0   No facility-administered medications prior to visit.    No Known Allergies     Objective:    BP 125/84   Pulse 80   Temp 98 F (36.7 C) (Temporal)   Ht _0  (1.6 m)   Wt 243 lb 12.8 oz (110.6 kg)   LMP 08/14/2022 (Exact Date)   SpO2 98%   BMI 43.19 kg/m  Wt Readings from Last 3 Encounters:  10/18/22 243 lb 12.8 oz (110.6 kg)  07/02/22 223 lb (101.2 kg)  02/18/22 233 lb 6 oz (105.9 kg)    Physical Exam Constitutional:      Appearance: She is well-developed.  HENT:     Head: Normocephalic and atraumatic.     Right Ear: Hearing, tympanic membrane, ear canal and external ear normal.     Left Ear: Hearing, tympanic membrane, ear canal and external ear normal.     Nose: Nose normal.     Right Turbinates: Not enlarged.     Left Turbinates: Not enlarged.     Mouth/Throat:     Lips: Pink.     Mouth: Mucous membranes are moist.     Dentition: No dental tenderness, gingival swelling, dental abscesses or gum lesions.     Pharynx: No oropharyngeal exudate.  Eyes:     General: No scleral icterus.       Right eye: No discharge.     Extraocular Movements: Extraocular  movements intact.     Conjunctiva/sclera: Conjunctivae normal.     Pupils: Pupils are equal, round, and reactive to light.  Neck:     Thyroid: No thyromegaly.     Trachea:  No tracheal deviation.  Cardiovascular:     Rate and Rhythm: Normal rate and regular rhythm.     Heart sounds: Normal heart sounds. No murmur heard.    No friction rub.  Pulmonary:     Effort: Pulmonary effort is normal. No accessory muscle usage or respiratory distress.     Breath sounds: Normal breath sounds. No decreased breath sounds, wheezing, rhonchi or rales.  Abdominal:     General: Bowel sounds are normal. There is no distension.     Palpations: Abdomen is soft. There is no mass.     Tenderness: There is no abdominal tenderness. There is no right CVA tenderness, left CVA tenderness, guarding or rebound.     Hernia: No hernia is present.  Musculoskeletal:        General: No tenderness or deformity. Normal range of motion.     Cervical back: Normal range of motion and neck supple.  Lymphadenopathy:     Cervical: No cervical adenopathy.  Skin:    General: Skin is warm and dry.     Findings: No erythema.  Neurological:     Mental Status: She is alert and oriented to person, place, and time.     Cranial Nerves: No cranial nerve deficit.     Motor: Motor function is intact.     Coordination: Coordination is intact. Coordination normal.     Gait: Gait is intact.     Deep Tendon Reflexes:     Reflex Scores:      Patellar reflexes are 1+ on the right side and 1+ on the left side. Psychiatric:        Attention and Perception: Attention normal.        Mood and Affect: Mood normal.        Speech: Speech normal.        Behavior: Behavior normal.        Thought Content: Thought content normal.        Judgment: Judgment normal.          Patient has been counseled extensively about nutrition and exercise as well as the importance of adherence with medications and regular follow-up. The patient was given  clear instructions to go to ER or return to medical center if symptoms don't improve, worsen or new problems develop. The patient verbalized understanding.   Follow-up: Return in about 4 months (around 02/16/2023) for HTN.   Gildardo Pounds, FNP-BC [facility] [place], [place]   10/18/2022, 11:09 AM

## 2022-10-19 ENCOUNTER — Other Ambulatory Visit: Payer: Self-pay | Admitting: Nurse Practitioner

## 2022-10-19 DIAGNOSIS — N760 Acute vaginitis: Secondary | ICD-10-CM

## 2022-10-19 LAB — CMP14+EGFR
ALT: 17 IU/L (ref 0–32)
AST: 16 IU/L (ref 0–40)
Albumin/Globulin Ratio: 1.3 (ref 1.2–2.2)
Albumin: 4.5 g/dL (ref 3.9–4.9)
Alkaline Phosphatase: 106 IU/L (ref 44–121)
BUN/Creatinine Ratio: 9 (ref 9–23)
BUN: 8 mg/dL (ref 6–24)
Bilirubin Total: 0.3 mg/dL (ref 0.0–1.2)
CO2: 26 mmol/L (ref 20–29)
Calcium: 10 mg/dL (ref 8.7–10.2)
Chloride: 100 mmol/L (ref 96–106)
Creatinine, Ser: 0.94 mg/dL (ref 0.57–1.00)
Globulin, Total: 3.5 g/dL (ref 1.5–4.5)
Glucose: 91 mg/dL (ref 70–99)
Potassium: 4.7 mmol/L (ref 3.5–5.2)
Sodium: 139 mmol/L (ref 134–144)
Total Protein: 8 g/dL (ref 6.0–8.5)
eGFR: 75 mL/min/{1.73_m2} (ref >59)

## 2022-10-19 LAB — CERVICOVAGINAL ANCILLARY ONLY
Bacterial Vaginitis (gardnerella): POSITIVE — AB
Candida Glabrata: NEGATIVE
Candida Vaginitis: NEGATIVE
Chlamydia: NEGATIVE
Comment: NEGATIVE
Comment: NEGATIVE
Comment: NEGATIVE
Comment: NEGATIVE
Comment: NEGATIVE
Comment: NORMAL
Neisseria Gonorrhea: NEGATIVE
Trichomonas: NEGATIVE

## 2022-10-19 LAB — CBC WITH DIFFERENTIAL/PLATELET
Basophils Absolute: 0 10*3/uL (ref 0.0–0.2)
Basos: 1 %
EOS (ABSOLUTE): 0.2 10*3/uL (ref 0.0–0.4)
Eos: 3 %
Hematocrit: 38.5 % (ref 34.0–46.6)
Hemoglobin: 13.5 g/dL (ref 11.1–15.9)
Immature Grans (Abs): 0 10*3/uL (ref 0.0–0.1)
Immature Granulocytes: 0 %
Lymphocytes Absolute: 2.4 10*3/uL (ref 0.7–3.1)
Lymphs: 30 %
MCH: 26.7 pg (ref 26.6–33.0)
MCHC: 35.1 g/dL (ref 31.5–35.7)
MCV: 76 fL — ABNORMAL LOW (ref 79–97)
Monocytes Absolute: 0.7 10*3/uL (ref 0.1–0.9)
Monocytes: 9 %
Neutrophils Absolute: 4.6 10*3/uL (ref 1.4–7.0)
Neutrophils: 57 %
Platelets: 454 10*3/uL — ABNORMAL HIGH (ref 150–450)
RBC: 5.05 x10E6/uL (ref 3.77–5.28)
RDW: 16.7 % — ABNORMAL HIGH (ref 11.7–15.4)
WBC: 8 10*3/uL (ref 3.4–10.8)

## 2022-10-19 LAB — LIPID PANEL
Chol/HDL Ratio: 3.9 ratio (ref 0.0–4.4)
Cholesterol, Total: 171 mg/dL (ref 100–199)
HDL: 44 mg/dL (ref >39)
LDL Chol Calc (NIH): 109 mg/dL — ABNORMAL HIGH (ref 0–99)
Triglycerides: 95 mg/dL (ref 0–149)
VLDL Cholesterol Cal: 18 mg/dL (ref 5–40)

## 2022-10-19 LAB — HEMOGLOBIN A1C
Est. average glucose Bld gHb Est-mCnc: 120 mg/dL
Hgb A1c MFr Bld: 5.8 % — ABNORMAL HIGH (ref 4.8–5.6)

## 2022-10-19 MED ORDER — METRONIDAZOLE 500 MG PO TABS
500.0000 mg | ORAL_TABLET | Freq: Two times a day (BID) | ORAL | 0 refills | Status: AC
  Filled 2022-10-19: qty 14, 7d supply, fill #0

## 2022-10-20 ENCOUNTER — Other Ambulatory Visit: Payer: Self-pay

## 2022-10-24 ENCOUNTER — Other Ambulatory Visit: Payer: Self-pay

## 2022-11-29 ENCOUNTER — Other Ambulatory Visit: Payer: Self-pay | Admitting: Family Medicine

## 2022-11-29 ENCOUNTER — Other Ambulatory Visit: Payer: Self-pay

## 2022-11-29 DIAGNOSIS — B356 Tinea cruris: Secondary | ICD-10-CM

## 2022-11-29 DIAGNOSIS — L209 Atopic dermatitis, unspecified: Secondary | ICD-10-CM

## 2022-11-30 ENCOUNTER — Other Ambulatory Visit: Payer: Self-pay

## 2022-11-30 MED ORDER — TRIAMCINOLONE ACETONIDE 0.025 % EX OINT
TOPICAL_OINTMENT | Freq: Two times a day (BID) | CUTANEOUS | 0 refills | Status: DC
  Filled 2022-11-30: qty 30, 30d supply, fill #0
  Filled 2023-01-03: qty 30, 30d supply, fill #1

## 2022-11-30 MED ORDER — NYSTATIN 100000 UNIT/GM EX CREA
1.0000 | TOPICAL_CREAM | Freq: Two times a day (BID) | CUTANEOUS | 0 refills | Status: DC
  Filled 2022-11-30: qty 30, 30d supply, fill #0
  Filled 2023-01-03: qty 60, 30d supply, fill #0

## 2022-12-08 ENCOUNTER — Other Ambulatory Visit: Payer: Self-pay

## 2023-01-03 ENCOUNTER — Other Ambulatory Visit: Payer: Self-pay

## 2023-02-13 ENCOUNTER — Other Ambulatory Visit: Payer: Self-pay | Admitting: Family Medicine

## 2023-02-13 DIAGNOSIS — L209 Atopic dermatitis, unspecified: Secondary | ICD-10-CM

## 2023-02-13 DIAGNOSIS — B356 Tinea cruris: Secondary | ICD-10-CM

## 2023-02-14 ENCOUNTER — Other Ambulatory Visit: Payer: Self-pay

## 2023-02-14 ENCOUNTER — Other Ambulatory Visit (HOSPITAL_BASED_OUTPATIENT_CLINIC_OR_DEPARTMENT_OTHER): Payer: Self-pay

## 2023-02-14 NOTE — Telephone Encounter (Signed)
Requested medication (s) are due for refill today- yes  Requested medication (s) are on the active medication list -yes  Future visit scheduled -yes  Last refill: 11/30/22  Notes to clinic: non delegated Rx, off protocol- provider review   Requested Prescriptions  Pending Prescriptions Disp Refills   nystatin cream (MYCOSTATIN) 60 g 0    Sig: Apply to breast area and groin 2 (two) times daily.     Off-Protocol Failed - 02/13/2023  7:10 PM      Failed - Medication not assigned to a protocol, review manually.      Passed - Valid encounter within last 12 months    Recent Outpatient Visits           3 months ago Chronic right-sided low back pain without sciatica   [place], [place], NP   8 months ago Tinea cruris   [place] Gildardo Pounds, NP   12 months ago Primary hypertension   [place], Vernia Buff, NP   1 year ago Primary hypertension   [place] Gildardo Pounds, NP   1 year ago Primary hypertension   [place], RPH-CPP       Future Appointments             Tomorrow Gildardo Pounds, NP [place]             triamcinolone (KENALOG) 0.025 % ointment 60 g 0    Sig: APPLY TO AFFECTED AREA TOPICALLY 2 (TWO) TIMES DAILY.     Not Delegated - Dermatology:  Corticosteroids Failed - 02/13/2023  7:10 PM      Failed - This refill cannot be delegated      Passed - Valid encounter within last 12 months    Recent Outpatient Visits           3 months ago Chronic right-sided low back pain without sciatica   [place], Vernia Buff, NP   8 months ago Tinea cruris   [place], Vernia Buff, NP   12 months ago Primary hypertension   [place], Vernia Buff, NP   1 year ago Primary hypertension   [place] Gildardo Pounds, NP   1 year ago Primary hypertension   [place], [place], RPH-CPP       Future Appointments             Tomorrow Gildardo Pounds, NP [place]               Requested Prescriptions  Pending Prescriptions Disp Refills   nystatin cream (MYCOSTATIN) 60 g 0    Sig: Apply to breast area and groin 2 (two) times daily.     Off-Protocol Failed - 02/13/2023  7:10 PM      Failed - Medication not assigned to a protocol, review manually.      Passed - Valid encounter within last 12 months    Recent Outpatient Visits           3 months ago Chronic right-sided low back pain without sciatica   [facility]  [place], Vernia Buff, NP   8 months ago Tinea cruris   [place] Gildardo Pounds, NP   12 months ago Primary hypertension   [place], Vernia Buff, NP   1 year ago Primary hypertension   [place] Gildardo Pounds, NP   1 year ago Primary hypertension   [place], RPH-CPP       Future Appointments             Tomorrow Gildardo Pounds, NP [place]             triamcinolone (KENALOG) 0.025 % ointment 60 g 0    Sig: APPLY TO AFFECTED AREA TOPICALLY 2 (TWO) TIMES DAILY.     Not Delegated - Dermatology:  Corticosteroids Failed - 02/13/2023  7:10 PM      Failed - This refill cannot be delegated      Passed - Valid encounter within last 12 months    Recent Outpatient Visits           3 months ago Chronic right-sided low back pain without sciatica   [place], Vernia Buff, NP   8 months ago Tinea cruris   [place], Vernia Buff, NP   12 months ago Primary hypertension   [place], Vernia Buff, NP   1 year ago Primary hypertension   [place], Vernia Buff, NP   1 year ago Primary hypertension   [place], [place], RPH-CPP       Future Appointments             Tomorrow Gildardo Pounds, NP [place]

## 2023-02-15 ENCOUNTER — Ambulatory Visit: Payer: Medicaid Other | Attending: Nurse Practitioner | Admitting: Nurse Practitioner

## 2023-02-15 ENCOUNTER — Other Ambulatory Visit: Payer: Self-pay

## 2023-02-15 ENCOUNTER — Encounter: Payer: Self-pay | Admitting: Nurse Practitioner

## 2023-02-15 VITALS — BP 127/86 | HR 85 | Ht 63.0 in | Wt 240.8 lb

## 2023-02-15 DIAGNOSIS — R109 Unspecified abdominal pain: Secondary | ICD-10-CM | POA: Diagnosis not present

## 2023-02-15 DIAGNOSIS — K219 Gastro-esophageal reflux disease without esophagitis: Secondary | ICD-10-CM | POA: Diagnosis not present

## 2023-02-15 DIAGNOSIS — I10 Essential (primary) hypertension: Secondary | ICD-10-CM

## 2023-02-15 DIAGNOSIS — R7303 Prediabetes: Secondary | ICD-10-CM

## 2023-02-15 DIAGNOSIS — R7989 Other specified abnormal findings of blood chemistry: Secondary | ICD-10-CM | POA: Diagnosis not present

## 2023-02-15 DIAGNOSIS — Z6841 Body Mass Index (BMI) 40.0 and over, adult: Secondary | ICD-10-CM

## 2023-02-15 MED ORDER — OMEPRAZOLE 20 MG PO CPDR
20.0000 mg | DELAYED_RELEASE_CAPSULE | Freq: Every day | ORAL | 3 refills | Status: DC
  Filled 2023-02-15: qty 30, 30d supply, fill #0
  Filled 2023-03-15: qty 30, 30d supply, fill #1
  Filled 2023-04-12: qty 30, 30d supply, fill #2
  Filled 2023-05-23: qty 30, 30d supply, fill #3

## 2023-02-15 NOTE — Progress Notes (Signed)
Assessment & Plan:  Monica Brennan was seen today for hypertension.  Diagnoses and all orders for this visit:  Prediabetes -     Hemoglobin A1c -     CMP14+EGFR  Primary hypertension Continue all antihypertensives as prescribed.  Reminded to bring in blood pressure log for follow  up appointment.  RECOMMENDATIONS: DASH/Mediterranean Diets are healthier choices for HTN.   -     CMP14+EGFR  Gastroesophageal reflux disease, unspecified whether esophagitis present INSTRUCTIONS: Avoid GERD Triggers: acidic, spicy or fried foods, caffeine, coffee, sodas,  alcohol and chocolate.   -     omeprazole (PRILOSEC) 20 MG capsule; Take 1 capsule (20 mg total) by mouth daily.  Left sided abdominal pain -     US PELVIC COMPLETE WITH TRANSVAGINAL; Future  Abnormal CBC -     CBC with Differential    Patient has been counseled on age-appropriate routine health concerns for screening and prevention. These are reviewed and up-to-date. Referrals have been placed accordingly. Immunizations are up-to-date or declined.    Subjective:   Chief Complaint  Patient presents with   Hypertension   HPI Monica Brennan 47 y.o. female presents to office today for follow up to HTN  Patient has been counseled on age-appropriate routine health concerns for screening and prevention. These are reviewed and up-to-date. Referrals have been placed accordingly. Immunizations are up-to-date or declined.       HTN  Blood pressure is elevated today. She is dealing with a lot of family stressors at this time. Daughter recently the victim of SA and has been diagnosed with bipolar disorder.  Adherent with amlodipine 10 mg daily and losartan 25 mg daily.  All medications were refilled today.   BP Readings from Last 3 Encounters:  02/15/23 (!) 143/92  10/18/22 125/84  07/02/22 140/86    Abdominal Pain: Patient complains of abdominal pain. The pain is described as aching. 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 Onset was several weeks ago. Symptoms have been unchanged since. Aggravating factors: none.  Alleviating factors: Muscle relaxants. Associated symptoms: none. The patient denies constipation, diarrhea, dysuria, hematochezia, hematuria, melena, nausea, and vomiting.   GERD: Paitent complains of heartburn. This has been associated with bilious reflux, deep pressure at base of neck, and heartburn.  She denies hematemesis, melena, unexpected weight loss, and upper abdominal discomfort. Symptoms have been present for several months. She denies dysphagia.  She has not lost weight. She denies melena, hematochezia, hematemesis, and coffee ground emesis. Medical therapy in the past has included none.      Review of Systems  Constitutional:  Negative for fever, malaise/fatigue and weight loss.  HENT: Negative.  Negative for nosebleeds.   Eyes: Negative.  Negative for blurred vision, double vision and photophobia.  Respiratory: Negative.  Negative for cough and shortness of breath.   Cardiovascular: Negative.  Negative for chest pain, palpitations and leg swelling.  Gastrointestinal:  Positive for abdominal pain. Negative for blood in stool, constipation, diarrhea, heartburn, melena, nausea and vomiting.  Genitourinary: Negative.   Musculoskeletal: Negative.  Negative for myalgias.  Neurological: Negative.  Negative for dizziness, focal weakness, seizures and headaches.  Psychiatric/Behavioral: Negative.  Negative for suicidal ideas.     Past Medical History:  Diagnosis Date   Hypertension Dx 2006   previously, on lisinopril. stopped lisinopril when medicaid ran out in 2007.    Morbid obesity ([place])    Prediabetes     Past Surgical History:  Procedure Laterality Date  CESAREAN SECTION     x 3, 1998,2000, 2006   COLONOSCOPY     DILATION AND CURETTAGE, DIAGNOSTIC / THERAPEUTIC     due to miscarriage   FOOT SURGERY Left 04/2021   Bone spur   IR ANGIOGRAM PELVIS SELECTIVE OR  SUPRASELECTIVE  11/03/2021   IR ANGIOGRAM PELVIS SELECTIVE OR SUPRASELECTIVE  11/03/2021   IR ANGIOGRAM SELECTIVE EACH ADDITIONAL VESSEL  11/03/2021   IR ANGIOGRAM SELECTIVE EACH ADDITIONAL VESSEL  11/03/2021   IR EMBO TUMOR ORGAN ISCHEMIA INFARCT INC GUIDE ROADMAPPING  11/03/2021   IR RADIOLOGIST EVAL & MGMT  09/22/2021   IR RADIOLOGIST EVAL & MGMT  11/18/2021   IR RADIOLOGIST EVAL & MGMT  03/03/2022   IR US GUIDE VASC ACCESS LEFT  11/03/2021   TUBAL LIGATION  12/05/2004    Family History  Problem Relation Age of Onset   Hypertension Mother    Heart disease Mother    Sleep apnea Mother    Depression Mother    Hypertension Father    Hypertension Sister    Anxiety disorder Sister    Depression Sister    Hypertension Sister    Anxiety disorder Sister    Depression Sister    Diabetes Brother    Hypertension Brother    Anxiety disorder Brother    Depression Brother    Diabetes Brother    Hypertension Brother    Depression Brother    Diabetes Maternal Aunt    Cancer Maternal Aunt        lung    Hypertension Maternal Grandmother    ADD / ADHD Daughter    Depression Daughter    Diabetes Son    Thyroid disease Son    ADD / ADHD Son    Epilepsy Son    Autism Son    Heart defect Nephew    Colon cancer Neg Hx    Esophageal cancer Neg Hx    Rectal cancer Neg Hx    Stomach cancer Neg Hx     Social History Reviewed with no changes to be made today.   Outpatient Medications Prior to Visit  Medication Sig Dispense Refill   amLODipine (NORVASC) 10 MG tablet Take 1 tablet (10 mg total) by mouth daily. 90 tablet 1   cyclobenzaprine (FLEXERIL) 10 MG tablet Take 1 tablet (10 mg total) by mouth 3 (three) times daily as needed for muscle spasms. 90 tablet 2   losartan (COZAAR) 25 MG tablet Take 1 tablet (25 mg total) by mouth daily. 90 tablet 1   nystatin cream (MYCOSTATIN) Apply to breast area and groin 2 (two) times daily. 60 g 0   triamcinolone (KENALOG) 0.025 % ointment APPLY TO  AFFECTED AREA TOPICALLY 2 (TWO) TIMES DAILY. 60 g 0   No facility-administered medications prior to visit.    No Known Allergies     Objective:    BP (!) 143/92   Pulse 85   Ht '5\' 3"'$  (1.6 m)   Wt 240 lb 12.8 oz (109.2 kg)   SpO2 98%   BMI 42.66 kg/m  Wt Readings from Last 3 Encounters:  02/15/23 240 lb 12.8 oz (109.2 kg)  10/18/22 243 lb 12.8 oz (110.6 kg)  07/02/22 223 lb (101.2 kg)    Physical Exam Vitals and nursing note reviewed.  Constitutional:      Appearance: She is well-developed.  HENT:     Head: Normocephalic and atraumatic.  Cardiovascular:     Rate and Rhythm: Normal rate and regular rhythm.  Heart sounds: Normal heart sounds. No murmur heard.    No friction rub. No gallop.  Pulmonary:     Effort: Pulmonary effort is normal. No tachypnea or respiratory distress.     Breath sounds: Normal breath sounds. No decreased breath sounds, wheezing, rhonchi or rales.  Chest:     Chest wall: No tenderness.  Abdominal:     General: Bowel sounds are normal.     Palpations: Abdomen is soft.     Tenderness: There is abdominal tenderness in the left lower quadrant.    Musculoskeletal:        General: Normal range of motion.     Cervical back: Normal range of motion.  Skin:    General: Skin is warm and dry.  Neurological:     Mental Status: She is alert and oriented to person, place, and time.     Coordination: Coordination normal.  Psychiatric:        Behavior: Behavior normal. Behavior is cooperative.        Thought Content: Thought content normal.        Judgment: Judgment normal.          Patient has been counseled extensively about nutrition and exercise as well as the importance of adherence with medications and regular follow-up. The patient was given clear instructions to go to ER or return to medical center if symptoms don't improve, worsen or new problems develop. The patient verbalized understanding.   Follow-up: Return in about 3 months  (around 05/18/2023) for HTN.   Gildardo Pounds, FNP-BC [facility] [place], [place]   02/15/2023, 12:18 PM

## 2023-02-16 ENCOUNTER — Other Ambulatory Visit: Payer: Self-pay

## 2023-02-16 LAB — CMP14+EGFR
ALT: 21 IU/L (ref 0–32)
AST: 18 IU/L (ref 0–40)
Albumin/Globulin Ratio: 1.2 (ref 1.2–2.2)
Albumin: 4.4 g/dL (ref 3.9–4.9)
Alkaline Phosphatase: 112 IU/L (ref 44–121)
BUN/Creatinine Ratio: 8 — ABNORMAL LOW (ref 9–23)
BUN: 7 mg/dL (ref 6–24)
Bilirubin Total: 0.3 mg/dL (ref 0.0–1.2)
CO2: 24 mmol/L (ref 20–29)
Calcium: 9.7 mg/dL (ref 8.7–10.2)
Chloride: 100 mmol/L (ref 96–106)
Creatinine, Ser: 0.85 mg/dL (ref 0.57–1.00)
Globulin, Total: 3.8 g/dL (ref 1.5–4.5)
Glucose: 93 mg/dL (ref 70–99)
Potassium: 4.3 mmol/L (ref 3.5–5.2)
Sodium: 138 mmol/L (ref 134–144)
Total Protein: 8.2 g/dL (ref 6.0–8.5)
eGFR: 84 mL/min/{1.73_m2} (ref >59)

## 2023-02-16 LAB — CBC WITH DIFFERENTIAL/PLATELET
Basophils Absolute: 0 10*3/uL (ref 0.0–0.2)
Basos: 0 %
EOS (ABSOLUTE): 0.1 10*3/uL (ref 0.0–0.4)
Eos: 1 %
Hematocrit: 40.1 % (ref 34.0–46.6)
Hemoglobin: 13.6 g/dL (ref 11.1–15.9)
Immature Grans (Abs): 0 10*3/uL (ref 0.0–0.1)
Immature Granulocytes: 0 %
Lymphocytes Absolute: 2.4 10*3/uL (ref 0.7–3.1)
Lymphs: 30 %
MCH: 26.2 pg — ABNORMAL LOW (ref 26.6–33.0)
MCHC: 33.9 g/dL (ref 31.5–35.7)
MCV: 77 fL — ABNORMAL LOW (ref 79–97)
Monocytes Absolute: 0.8 10*3/uL (ref 0.1–0.9)
Monocytes: 10 %
Neutrophils Absolute: 4.8 10*3/uL (ref 1.4–7.0)
Neutrophils: 59 %
Platelets: 445 10*3/uL (ref 150–450)
RBC: 5.2 x10E6/uL (ref 3.77–5.28)
RDW: 16.8 % — ABNORMAL HIGH (ref 11.7–15.4)
WBC: 8.1 10*3/uL (ref 3.4–10.8)

## 2023-02-16 LAB — HEMOGLOBIN A1C
Est. average glucose Bld gHb Est-mCnc: 123 mg/dL
Hgb A1c MFr Bld: 5.9 % — ABNORMAL HIGH (ref 4.8–5.6)

## 2023-02-20 ENCOUNTER — Other Ambulatory Visit: Payer: Self-pay

## 2023-03-15 ENCOUNTER — Other Ambulatory Visit: Payer: Self-pay | Admitting: Family Medicine

## 2023-03-15 ENCOUNTER — Other Ambulatory Visit: Payer: Self-pay | Admitting: Nurse Practitioner

## 2023-03-15 DIAGNOSIS — G8929 Other chronic pain: Secondary | ICD-10-CM

## 2023-03-15 DIAGNOSIS — B356 Tinea cruris: Secondary | ICD-10-CM

## 2023-03-15 DIAGNOSIS — L209 Atopic dermatitis, unspecified: Secondary | ICD-10-CM

## 2023-03-16 ENCOUNTER — Other Ambulatory Visit: Payer: Self-pay

## 2023-03-16 ENCOUNTER — Ambulatory Visit
Admission: RE | Admit: 2023-03-16 | Discharge: 2023-03-16 | Disposition: A | Discharge status: Home / Self Care | Payer: Medicaid Other | Source: Ambulatory Visit | Attending: Nurse Practitioner | Admitting: Nurse Practitioner

## 2023-03-16 DIAGNOSIS — N852 Hypertrophy of uterus: Secondary | ICD-10-CM | POA: Diagnosis not present

## 2023-03-16 DIAGNOSIS — R109 Unspecified abdominal pain: Secondary | ICD-10-CM

## 2023-03-16 DIAGNOSIS — D259 Leiomyoma of uterus, unspecified: Secondary | ICD-10-CM | POA: Diagnosis not present

## 2023-03-16 DIAGNOSIS — N858 Other specified noninflammatory disorders of uterus: Secondary | ICD-10-CM | POA: Diagnosis not present

## 2023-03-16 MED ORDER — NYSTATIN 100000 UNIT/GM EX CREA
1.0000 | TOPICAL_CREAM | Freq: Two times a day (BID) | CUTANEOUS | 0 refills | Status: DC
  Filled 2023-03-16: qty 60, 30d supply, fill #0

## 2023-03-16 MED ORDER — TRIAMCINOLONE ACETONIDE 0.025 % EX OINT
TOPICAL_OINTMENT | Freq: Two times a day (BID) | CUTANEOUS | 0 refills | Status: DC
  Filled 2023-03-16: qty 60, 30d supply, fill #0

## 2023-03-16 MED ORDER — CYCLOBENZAPRINE HCL 10 MG PO TABS
10.0000 mg | ORAL_TABLET | Freq: Three times a day (TID) | ORAL | 2 refills | Status: DC | PRN
  Filled 2023-03-16: qty 90, 30d supply, fill #0
  Filled 2023-04-12: qty 90, 30d supply, fill #1
  Filled 2023-05-23: qty 90, 30d supply, fill #2

## 2023-03-21 ENCOUNTER — Other Ambulatory Visit: Payer: Self-pay | Admitting: Nurse Practitioner

## 2023-03-21 DIAGNOSIS — R102 Pelvic and perineal pain: Secondary | ICD-10-CM

## 2023-04-12 ENCOUNTER — Other Ambulatory Visit: Payer: Self-pay

## 2023-04-12 ENCOUNTER — Other Ambulatory Visit: Payer: Self-pay | Admitting: Family Medicine

## 2023-04-12 DIAGNOSIS — L209 Atopic dermatitis, unspecified: Secondary | ICD-10-CM

## 2023-04-12 DIAGNOSIS — B356 Tinea cruris: Secondary | ICD-10-CM

## 2023-04-12 MED ORDER — TRIAMCINOLONE ACETONIDE 0.025 % EX OINT
TOPICAL_OINTMENT | Freq: Two times a day (BID) | CUTANEOUS | 0 refills | Status: DC
  Filled 2023-04-12: qty 60, 30d supply, fill #0

## 2023-04-12 MED ORDER — NYSTATIN 100000 UNIT/GM EX CREA
1.0000 | TOPICAL_CREAM | Freq: Two times a day (BID) | CUTANEOUS | 0 refills | Status: DC
  Filled 2023-04-12: qty 60, 30d supply, fill #0

## 2023-04-12 NOTE — Telephone Encounter (Signed)
Requested medication (s) are due for refill today: yes  Requested medication (s) are on the active medication list: yes  Last refill:  both meds last refilled 03/16/23 60 grams each   Future visit scheduled: yes  Notes to clinic:  Nystatin: not assigned to a protocol and triamcinolone: med not delegated to NT to RF    Requested Prescriptions  Pending Prescriptions Disp Refills   nystatin cream (MYCOSTATIN) 60 g 0    Sig: Apply to breast area and groin 2 (two) times daily.     Off-Protocol Failed - 04/12/2023 11:13 AM      Failed - Medication not assigned to a protocol, review manually.      Passed - Valid encounter within last 12 months    Recent Outpatient Visits           1 month ago Primary hypertension   [place] [facility] [place], [place] W, NP   5 months ago Chronic right-sided low back pain without sciatica   [facility] Claiborne Rigg, NP   10 months ago Tinea cruris   [facility] Claiborne Rigg, NP   1 year ago Primary hypertension   [place] [facility] [place], Shea Stakes, NP   1 year ago Primary hypertension   [place] [facility] Claiborne Rigg, NP       Future Appointments             In 1 month Claiborne Rigg, NP [facility]             triamcinolone (KENALOG) 0.025 % ointment 60 g 0    Sig: APPLY TO AFFECTED AREA TOPICALLY 2 (TWO) TIMES DAILY.     Not Delegated - Dermatology:  Corticosteroids Failed - 04/12/2023 11:13 AM      Failed - This refill cannot be delegated      Passed - Valid encounter within last 12 months    Recent Outpatient Visits           1 month ago Primary hypertension   [place] [facility] [place], [place] W, NP   5 months ago Chronic right-sided low back pain without sciatica   [facility] Claiborne Rigg, NP   10 months ago Tinea cruris   [place] [facility] [place], Shea Stakes, NP   1 year ago Primary hypertension   [place] [facility] [place], Shea Stakes, NP   1 year ago Primary hypertension    [facility] Claiborne Rigg, NP       Future Appointments             In 1 month Claiborne Rigg, NP [facility]

## 2023-04-13 ENCOUNTER — Other Ambulatory Visit: Payer: Self-pay

## 2023-05-08 ENCOUNTER — Ambulatory Visit: Payer: Medicaid Other | Admitting: Nurse Practitioner

## 2023-05-19 ENCOUNTER — Ambulatory Visit: Payer: Medicaid Other | Admitting: Nurse Practitioner

## 2023-05-25 ENCOUNTER — Ambulatory Visit: Payer: Medicaid Other | Admitting: Obstetrics and Gynecology

## 2023-05-25 ENCOUNTER — Other Ambulatory Visit (HOSPITAL_COMMUNITY)
Admission: RE | Admit: 2023-05-25 | Discharge: 2023-05-25 | Disposition: A | Discharge status: Home / Self Care | Payer: Medicaid Other | Source: Ambulatory Visit | Attending: Obstetrics and Gynecology | Admitting: Obstetrics and Gynecology

## 2023-05-25 ENCOUNTER — Other Ambulatory Visit: Payer: Self-pay | Admitting: Obstetrics and Gynecology

## 2023-05-25 ENCOUNTER — Encounter: Payer: Self-pay | Admitting: Obstetrics and Gynecology

## 2023-05-25 VITALS — BP 127/87 | HR 75 | Ht 62.0 in | Wt 242.0 lb

## 2023-05-25 DIAGNOSIS — Z01419 Encounter for gynecological examination (general) (routine) without abnormal findings: Secondary | ICD-10-CM | POA: Diagnosis not present

## 2023-05-25 DIAGNOSIS — Z1231 Encounter for screening mammogram for malignant neoplasm of breast: Secondary | ICD-10-CM | POA: Diagnosis not present

## 2023-05-25 DIAGNOSIS — N898 Other specified noninflammatory disorders of vagina: Secondary | ICD-10-CM | POA: Diagnosis not present

## 2023-05-25 DIAGNOSIS — D259 Leiomyoma of uterus, unspecified: Secondary | ICD-10-CM

## 2023-05-25 NOTE — Progress Notes (Signed)
GYNECOLOGY ANNUAL PREVENTATIVE CARE ENCOUNTER NOTE  History:     Monica Brennan is a 47 y.o. [phone] female here for a routine annual gynecologic exam.  Current complaints: irregular bleeding, hx of uterine fibroids.   Denies abnormal vaginal bleeding, discharge, pelvic pain, problems with intercourse or other gynecologic concerns.    Gynecologic History Patient's last menstrual period was 01/16/2023 (approximate). Contraception: tubal ligation Last Pap: 03/31/21. Results were: normal with negative HPV Last mammogram: 8/23. Results were: normal  Obstetric History OB History  Gravida Para Term Preterm AB Living  4 3 2 1 1 3   SAB IAB Ectopic Multiple Live Births  1       3    # Outcome Date GA Lbr Len/2nd Weight Sex Delivery Anes PTL Lv  4 Preterm 08/12/05    F    LIV  3 SAB 2005        FD     Birth Comments: spontaneous miscarriage  2 Term 09/12/99 [redacted]w[redacted]d   M CS-LTranv   LIV  1 Term 01/03/97 [redacted]w[redacted]d   M CS-LTranv   LIV    Past Medical History:  Diagnosis Date   Hypertension Dx 2006   previously, on lisinopril. stopped lisinopril when medicaid ran out in 2007.    Morbid obesity ([facility])    Prediabetes     Past Surgical History:  Procedure Laterality Date   CESAREAN SECTION     x 3, 1998,2000, 2006   COLONOSCOPY     DILATION AND CURETTAGE, DIAGNOSTIC / THERAPEUTIC     due to miscarriage   FOOT SURGERY Left 04/2021   Bone spur   IR ANGIOGRAM PELVIS SELECTIVE OR SUPRASELECTIVE  11/03/2021   IR ANGIOGRAM PELVIS SELECTIVE OR SUPRASELECTIVE  11/03/2021   IR ANGIOGRAM SELECTIVE EACH ADDITIONAL VESSEL  11/03/2021   IR ANGIOGRAM SELECTIVE EACH ADDITIONAL VESSEL  11/03/2021   IR EMBO TUMOR ORGAN ISCHEMIA INFARCT INC GUIDE ROADMAPPING  11/03/2021   IR RADIOLOGIST EVAL & MGMT  09/22/2021   IR RADIOLOGIST EVAL & MGMT  11/18/2021   IR RADIOLOGIST EVAL & MGMT  03/03/2022   IR US GUIDE VASC ACCESS LEFT  11/03/2021   TUBAL LIGATION  12/05/2004    Current Outpatient Medications on  File Prior to Visit  Medication Sig Dispense Refill   amLODipine (NORVASC) 10 MG tablet Take 1 tablet (10 mg total) by mouth daily. 90 tablet 1   cyclobenzaprine (FLEXERIL) 10 MG tablet Take 1 tablet (10 mg total) by mouth 3 (three) times daily as needed for muscle spasms. 90 tablet 2   losartan (COZAAR) 25 MG tablet Take 1 tablet (25 mg total) by mouth daily. 90 tablet 1   nystatin cream (MYCOSTATIN) Apply to breast area and groin 2 (two) times daily. 60 g 0   omeprazole (PRILOSEC) 20 MG capsule Take 1 capsule (20 mg total) by mouth daily. 30 capsule 3   triamcinolone (KENALOG) 0.025 % ointment APPLY TO AFFECTED AREA TOPICALLY 2 (TWO) TIMES DAILY. 60 g 0   No current facility-administered medications on file prior to visit.    No Known Allergies  Social History:  reports that she has never smoked. She has never used smokeless tobacco. She reports that she does not drink alcohol and does not use drugs.  Family History  Problem Relation Age of Onset   Hypertension Mother    Heart disease Mother    Sleep apnea Mother    Depression Mother    Hypertension Father    Hypertension  Sister    Anxiety disorder Sister    Depression Sister    Hypertension Sister    Anxiety disorder Sister    Depression Sister    Diabetes Brother    Hypertension Brother    Anxiety disorder Brother    Depression Brother    Diabetes Brother    Hypertension Brother    Depression Brother    Diabetes Maternal Aunt    Cancer Maternal Aunt        lung    Hypertension Maternal Grandmother    ADD / ADHD Daughter    Depression Daughter    Diabetes Son    Thyroid disease Son    ADD / ADHD Son    Epilepsy Son    Autism Son    Heart defect Nephew    Colon cancer Neg Hx    Esophageal cancer Neg Hx    Rectal cancer Neg Hx    Stomach cancer Neg Hx     The following portions of the patient's history were reviewed and updated as appropriate: allergies, current medications, past family history, past medical  history, past social history, past surgical history and problem list.  Review of Systems Pertinent items noted in HPI and remainder of comprehensive ROS otherwise negative.  Physical Exam:  BP 127/87   Pulse 75   Ht 5\' 2"  (1.575 m)   Wt 242 lb (109.8 kg)   LMP 01/16/2023 (Approximate)   BMI 44.26 kg/m  CONSTITUTIONAL: Well-developed, well-nourished obese, female in no acute distress.  HENT:  Normocephalic, atraumatic, External right and left ear normal. Oropharynx is clear and moist EYES: Conjunctivae and EOM are normal.  NECK: Normal range of motion, supple, no masses.  Normal thyroid.  SKIN: Skin is warm and dry. No rash noted. Not diaphoretic. No erythema. No pallor. MUSCULOSKELETAL: Normal range of motion. No tenderness.  No cyanosis, clubbing, or edema.  2+ distal pulses. NEUROLOGIC: Alert and oriented to person, place, and time. Normal reflexes, muscle tone coordination.  PSYCHIATRIC: Normal mood and affect. Normal behavior. Normal judgment and thought content. CARDIOVASCULAR: Normal heart rate noted, regular rhythm RESPIRATORY: Clear to auscultation bilaterally. Effort and breath sounds normal, no problems with respiration noted. BREASTS: Symmetric in size. No masses, tenderness, skin changes, nipple drainage, or lymphadenopathy bilaterally. Performed in the presence of a chaperone. ABDOMEN: Soft, no distention noted.  No tenderness, rebound or guarding.  PELVIC: Normal appearing external genitalia and urethral meatus; normal appearing vaginal mucosa and cervix.  No abnormal discharge noted.  Vaginal swab taken.  Enlarged uterine size, no other palpable masses, no uterine or adnexal tenderness.  Performed in the presence of a chaperone. Hypopigmented area superior to the clitoral region, suspicious for lichen sclerosis.  Excoriations noted.    CLINICAL DATA:  Left-sided pelvic pain   EXAM: TRANSABDOMINAL AND TRANSVAGINAL ULTRASOUND OF PELVIS   TECHNIQUE: Both transabdominal  and transvaginal ultrasound examinations of the pelvis were performed. Transabdominal technique was performed for global imaging of the pelvis including uterus, ovaries, adnexal regions, and pelvic cul-de-sac. It was necessary to proceed with endovaginal exam following the transabdominal exam to visualize the uterus and adnexa.   COMPARISON:  None Available.   FINDINGS: Uterus   Measurements: 10.1 x 10.3 x 10.6 cm = volume: 580.7 mL. Lobulated uterine contour with multiple calcified and noncalcified masses consistent with fibroids. The largest measurable fibroids are visualized at the left uterine corpus measuring 4.6 x 4.2 x 3.9 cm. Posterior rim calcified uterine corpus fibroid measuring 2.9 x 2.6 by  2.8 cm. Right partially calcified uterine corpus fibroid measuring 3.2 by 3.2 x 2.6 cm.   Endometrium   Thickness: 6.2 mm.  Difficult to visualize   Right ovary   Not seen   Left ovary   Not seen   Other findings   No abnormal free fluid.   IMPRESSION: 1. Enlarged uterus with multiple calcified and noncalcified fibroids. 2. Nonvisualized ovaries.   Assessment and Plan:    1. Women's annual routine gynecological examination Normal annual exam Due to peri-vaginal skin changes, return in 3-4 weeks for vulvar biopsy - MM Digital Screening; Future  2. Vaginal discharge No obvious discharge in vagina, pt concerned about itching - Cervicovaginal ancillary only( [place])  3. Uterine leiomyoma, unspecified location Reviewed IR notes and current ultrasound.  Fibroids are calcified and well treated.  Pt wishes to pursue expectant management for perimenopausal bleeding.  Lysteda has been offered but patient would like to hold for now  4. Visit for screening mammogram  - MM Digital Screening; Future  Mammogram scheduled Routine preventative health maintenance measures emphasized. Please refer to After Visit Summary for other counseling recommendations.       Mariel Aloe, MD, FACOG Obstetrician & Gynecologist, [facility], [facility]

## 2023-05-25 NOTE — Progress Notes (Signed)
Pt is new to office needing routine GYN exam.   Pt would like to discuss fibroids. Pt is having irregular cycles, will have heavy cycles that last about 8 days with some cramping.  Pt is also having some pubic itching.

## 2023-05-26 LAB — CERVICOVAGINAL ANCILLARY ONLY
Bacterial Vaginitis (gardnerella): NEGATIVE
Candida Glabrata: NEGATIVE
Candida Vaginitis: NEGATIVE
Comment: NEGATIVE
Comment: NEGATIVE
Comment: NEGATIVE
Comment: NEGATIVE
Trichomonas: NEGATIVE

## 2023-06-07 ENCOUNTER — Other Ambulatory Visit: Payer: Self-pay

## 2023-06-07 ENCOUNTER — Encounter: Payer: Self-pay | Admitting: Nurse Practitioner

## 2023-06-07 ENCOUNTER — Ambulatory Visit: Payer: Medicaid Other | Attending: Nurse Practitioner | Admitting: Nurse Practitioner

## 2023-06-07 DIAGNOSIS — L209 Atopic dermatitis, unspecified: Secondary | ICD-10-CM | POA: Diagnosis not present

## 2023-06-07 DIAGNOSIS — I10 Essential (primary) hypertension: Secondary | ICD-10-CM

## 2023-06-07 DIAGNOSIS — K219 Gastro-esophageal reflux disease without esophagitis: Secondary | ICD-10-CM | POA: Diagnosis not present

## 2023-06-07 MED ORDER — OMEPRAZOLE 20 MG PO CPDR
20.0000 mg | DELAYED_RELEASE_CAPSULE | Freq: Every day | ORAL | 3 refills | Status: DC
  Filled 2023-06-07 – 2023-06-27 (×2): qty 30, 30d supply, fill #0
  Filled 2023-07-14 – 2023-07-23 (×2): qty 30, 30d supply, fill #1
  Filled 2023-10-08: qty 30, 30d supply, fill #2
  Filled 2023-11-24: qty 30, 30d supply, fill #3

## 2023-06-07 MED ORDER — LOSARTAN POTASSIUM 25 MG PO TABS
25.0000 mg | ORAL_TABLET | Freq: Every day | ORAL | 1 refills | Status: DC
  Filled 2023-06-07 – 2023-06-27 (×2): qty 90, 90d supply, fill #0
  Filled 2023-07-14 – 2023-10-08 (×2): qty 90, 90d supply, fill #1

## 2023-06-07 MED ORDER — AMLODIPINE BESYLATE 10 MG PO TABS
10.0000 mg | ORAL_TABLET | Freq: Every day | ORAL | 1 refills | Status: DC
  Filled 2023-06-07 – 2023-06-27 (×2): qty 90, 90d supply, fill #0
  Filled 2023-07-14 – 2023-10-08 (×2): qty 90, 90d supply, fill #1

## 2023-06-07 MED ORDER — TRIAMCINOLONE ACETONIDE 0.025 % EX OINT
TOPICAL_OINTMENT | Freq: Two times a day (BID) | CUTANEOUS | 0 refills | Status: DC
  Filled 2023-06-07 – 2023-06-27 (×2): qty 60, 30d supply, fill #0

## 2023-06-07 NOTE — Progress Notes (Signed)
Assessment & Plan:  Monica Brennan was seen today for hypertension.  Diagnoses and all orders for this visit:  Primary hypertension -     CMP14+EGFR -     losartan (COZAAR) 25 MG tablet; Take 1 tablet (25 mg total) by mouth daily. -     amLODipine (NORVASC) 10 MG tablet; Take 1 tablet (10 mg total) by mouth daily.  Gastroesophageal reflux disease, unspecified whether esophagitis present -     omeprazole (PRILOSEC) 20 MG capsule; Take 1 capsule (20 mg total) by mouth daily.  Atopic dermatitis, unspecified type -     triamcinolone (KENALOG) 0.025 % ointment; APPLY TO AFFECTED AREA TOPICALLY 2 (TWO) TIMES DAILY.    Patient has been counseled on age-appropriate routine health concerns for screening and prevention. These are reviewed and up-to-date. Referrals have been placed accordingly. Immunizations are up-to-date or declined.    Subjective:   Chief Complaint  Patient presents with   Hypertension   Hypertension Pertinent negatives include no blurred vision, chest pain, headaches, malaise/fatigue, palpitations or shortness of breath.   Monica Brennan 47 y.o. female presents to office today for follow up to HTN  Patient has been counseled on age-appropriate routine health concerns for screening and prevention. These are reviewed and up-to-date. Referrals have been placed accordingly. Immunizations are up-to-date or declined.     MAMMOGRAM: UTD PAP SMEAR: UTD COLONOSCOPY: UTD  HTN  Blood pressure is at goal today.Adherent with amlodipine 10 mg daily and losartan 25 mg daily.  All medications were refilled today.   BP Readings from Last 3 Encounters:  06/07/23 130/87  05/25/23 127/87  02/15/23 127/86     Review of Systems  Constitutional:  Negative for fever, malaise/fatigue and weight loss.  HENT: Negative.  Negative for nosebleeds.   Eyes: Negative.  Negative for blurred vision, double vision and photophobia.  Respiratory: Negative.  Negative for cough and shortness of  breath.   Cardiovascular: Negative.  Negative for chest pain, palpitations and leg swelling.  Gastrointestinal: Negative.  Negative for heartburn, nausea and vomiting.  Musculoskeletal: Negative.  Negative for myalgias.  Neurological: Negative.  Negative for dizziness, focal weakness, seizures and headaches.  Psychiatric/Behavioral: Negative.  Negative for suicidal ideas.     Past Medical History:  Diagnosis Date   Hypertension Dx 2006   previously, on lisinopril. stopped lisinopril when medicaid ran out in 2007.    Morbid obesity (HCC)    Prediabetes     Past Surgical History:  Procedure Laterality Date   CESAREAN SECTION     x 3, 1998,2000, 2006   COLONOSCOPY     DILATION AND CURETTAGE, DIAGNOSTIC / THERAPEUTIC     due to miscarriage   FOOT SURGERY Left 04/2021   Bone spur   IR ANGIOGRAM PELVIS SELECTIVE OR SUPRASELECTIVE  11/03/2021   IR ANGIOGRAM PELVIS SELECTIVE OR SUPRASELECTIVE  11/03/2021   IR ANGIOGRAM SELECTIVE EACH ADDITIONAL VESSEL  11/03/2021   IR ANGIOGRAM SELECTIVE EACH ADDITIONAL VESSEL  11/03/2021   IR EMBO TUMOR ORGAN ISCHEMIA INFARCT INC GUIDE ROADMAPPING  11/03/2021   IR RADIOLOGIST EVAL & MGMT  09/22/2021   IR RADIOLOGIST EVAL & MGMT  11/18/2021   IR RADIOLOGIST EVAL & MGMT  03/03/2022   IR US GUIDE VASC ACCESS LEFT  11/03/2021   TUBAL LIGATION  12/05/2004    Family History  Problem Relation Age of Onset   Hypertension Mother    Heart disease Mother    Sleep apnea Mother    Depression Mother  Hypertension Father    Hypertension Sister    Anxiety disorder Sister    Depression Sister    Hypertension Sister    Anxiety disorder Sister    Depression Sister    Diabetes Brother    Hypertension Brother    Anxiety disorder Brother    Depression Brother    Diabetes Brother    Hypertension Brother    Depression Brother    Diabetes Maternal Aunt    Cancer Maternal Aunt        lung    Hypertension Maternal Grandmother    ADD / ADHD Daughter     Depression Daughter    Diabetes Son    Thyroid disease Son    ADD / ADHD Son    Epilepsy Son    Autism Son    Heart defect Nephew    Colon cancer Neg Hx    Esophageal cancer Neg Hx    Rectal cancer Neg Hx    Stomach cancer Neg Hx     Social History Reviewed with no changes to be made today.   Outpatient Medications Prior to Visit  Medication Sig Dispense Refill   cyclobenzaprine (FLEXERIL) 10 MG tablet Take 1 tablet (10 mg total) by mouth 3 (three) times daily as needed for muscle spasms. 90 tablet 2   nystatin cream (MYCOSTATIN) Apply to breast area and groin 2 (two) times daily. 60 g 0   amLODipine (NORVASC) 10 MG tablet Take 1 tablet (10 mg total) by mouth daily. 90 tablet 1   losartan (COZAAR) 25 MG tablet Take 1 tablet (25 mg total) by mouth daily. 90 tablet 1   omeprazole (PRILOSEC) 20 MG capsule Take 1 capsule (20 mg total) by mouth daily. 30 capsule 3   triamcinolone (KENALOG) 0.025 % ointment APPLY TO AFFECTED AREA TOPICALLY 2 (TWO) TIMES DAILY. 60 g 0   No facility-administered medications prior to visit.    No Known Allergies     Objective:    BP 130/87 (BP Location: Left Arm, Patient Position: Sitting, Cuff Size: Normal)   Pulse 81   Wt 242 lb (109.8 kg)   LMP 01/16/2023 (Approximate)   SpO2 96%   BMI 44.26 kg/m  Wt Readings from Last 3 Encounters:  06/07/23 242 lb (109.8 kg)  05/25/23 242 lb (109.8 kg)  02/15/23 240 lb 12.8 oz (109.2 kg)    Physical Exam Vitals and nursing note reviewed.  Constitutional:      Appearance: She is well-developed.  HENT:     Head: Normocephalic and atraumatic.  Cardiovascular:     Rate and Rhythm: Normal rate and regular rhythm.     Heart sounds: Normal heart sounds. No murmur heard.    No friction rub. No gallop.  Pulmonary:     Effort: Pulmonary effort is normal. No tachypnea or respiratory distress.     Breath sounds: Normal breath sounds. No decreased breath sounds, wheezing, rhonchi or rales.  Chest:     Chest  wall: No tenderness.  Abdominal:     General: Bowel sounds are normal.     Palpations: Abdomen is soft.  Musculoskeletal:        General: Normal range of motion.     Cervical back: Normal range of motion.  Skin:    General: Skin is warm and dry.  Neurological:     Mental Status: She is alert and oriented to person, place, and time.     Coordination: Coordination normal.  Psychiatric:  Behavior: Behavior normal. Behavior is cooperative.        Thought Content: Thought content normal.        Judgment: Judgment normal.          Patient has been counseled extensively about nutrition and exercise as well as the importance of adherence with medications and regular follow-up. The patient was given clear instructions to go to ER or return to medical center if symptoms don't improve, worsen or new problems develop. The patient verbalized understanding.   Follow-up: Return in about 3 months (around 09/07/2023).   Claiborne Rigg, FNP-BC [facility] [place], [place] [phone]   06/07/2023, 2:11 PM

## 2023-06-08 LAB — CMP14+EGFR
ALT: 21 IU/L (ref 0–32)
AST: 13 IU/L (ref 0–40)
Albumin: 4.1 g/dL (ref 3.9–4.9)
Alkaline Phosphatase: 105 IU/L (ref 44–121)
BUN/Creatinine Ratio: 10 (ref 9–23)
BUN: 8 mg/dL (ref 6–24)
Bilirubin Total: 0.3 mg/dL (ref 0.0–1.2)
CO2: 25 mmol/L (ref 20–29)
Calcium: 9.3 mg/dL (ref 8.7–10.2)
Chloride: 105 mmol/L (ref 96–106)
Creatinine, Ser: 0.79 mg/dL (ref 0.57–1.00)
Globulin, Total: 3.2 g/dL (ref 1.5–4.5)
Glucose: 102 mg/dL — ABNORMAL HIGH (ref 70–99)
Potassium: 4 mmol/L (ref 3.5–5.2)
Sodium: 141 mmol/L (ref 134–144)
Total Protein: 7.3 g/dL (ref 6.0–8.5)
eGFR: 92 mL/min/{1.73_m2} (ref >59)

## 2023-06-13 ENCOUNTER — Other Ambulatory Visit: Payer: Self-pay

## 2023-06-13 ENCOUNTER — Other Ambulatory Visit (HOSPITAL_COMMUNITY)
Admission: RE | Admit: 2023-06-13 | Discharge: 2023-06-13 | Disposition: A | Discharge status: Home / Self Care | Payer: Medicaid Other | Source: Ambulatory Visit | Attending: Obstetrics and Gynecology | Admitting: Obstetrics and Gynecology

## 2023-06-13 ENCOUNTER — Ambulatory Visit (INDEPENDENT_AMBULATORY_CARE_PROVIDER_SITE_OTHER): Payer: Medicaid Other | Admitting: Obstetrics and Gynecology

## 2023-06-13 ENCOUNTER — Encounter: Payer: Self-pay | Admitting: Obstetrics and Gynecology

## 2023-06-13 VITALS — BP 114/82 | HR 73 | Ht 63.0 in | Wt 239.0 lb

## 2023-06-13 DIAGNOSIS — N9089 Other specified noninflammatory disorders of vulva and perineum: Secondary | ICD-10-CM

## 2023-06-13 DIAGNOSIS — N898 Other specified noninflammatory disorders of vagina: Secondary | ICD-10-CM

## 2023-06-13 DIAGNOSIS — N762 Acute vulvitis: Secondary | ICD-10-CM | POA: Diagnosis not present

## 2023-06-13 NOTE — Progress Notes (Signed)
VULVAR BIOPSY NOTE The indications for vulvar biopsy (rule out neoplasia, establish lichen sclerosus diagnosis) were reviewed.   Risks of the biopsy including pain, bleeding, infection, inadequate specimen, scarring and need for additional procedures  were discussed. The patient stated understanding and agreed to undergo procedure today. Consent was signed,  time out performed. A thin band of grayish white tissue was noted in the periclitoral/upper labial region.  A small fissure was noted at the midline. The patient's vulva was prepped with Betadine. 7 ml of 1% lidocaine was injected into the upper left labia in the peri-clitoral region. A 4-mm punch biopsy was done, biopsy tissue was picked up with sterile forceps and sterile scissors were used to excise the lesion.  Small bleeding was noted and hemostasis was achieved using silver nitrate sticks.  The patient tolerated the procedure well. Post-procedure instructions  (pelvic rest for one week) were given to the patient. The patient is to call with heavy bleeding, fever greater than 100.4, foul smelling vaginal discharge or other concerns. Will reach out to patient regarding biopsy findings.  Pt c/o vaginal discharge so a vaginal swab was taken as well.  Mariel Aloe, MD

## 2023-06-13 NOTE — Progress Notes (Signed)
47 y.o. GYN presents for Vulvar Bx.   Last PAP 03/31/2021 NILM.

## 2023-06-14 LAB — CERVICOVAGINAL ANCILLARY ONLY
Bacterial Vaginitis (gardnerella): NEGATIVE
Candida Glabrata: NEGATIVE
Candida Vaginitis: NEGATIVE
Comment: NEGATIVE
Comment: NEGATIVE
Comment: NEGATIVE
Comment: NEGATIVE
Trichomonas: NEGATIVE

## 2023-06-16 LAB — SURGICAL PATHOLOGY

## 2023-06-27 ENCOUNTER — Other Ambulatory Visit: Payer: Self-pay

## 2023-06-27 ENCOUNTER — Encounter: Payer: Self-pay | Admitting: Obstetrics and Gynecology

## 2023-06-27 ENCOUNTER — Other Ambulatory Visit: Payer: Self-pay | Admitting: Family Medicine

## 2023-06-27 DIAGNOSIS — G8929 Other chronic pain: Secondary | ICD-10-CM

## 2023-06-28 ENCOUNTER — Other Ambulatory Visit: Payer: Self-pay

## 2023-06-28 ENCOUNTER — Other Ambulatory Visit: Payer: Self-pay | Admitting: Obstetrics and Gynecology

## 2023-06-28 DIAGNOSIS — N9089 Other specified noninflammatory disorders of vulva and perineum: Secondary | ICD-10-CM

## 2023-06-28 MED ORDER — CLOBETASOL PROPIONATE 0.05 % EX OINT
1.0000 | TOPICAL_OINTMENT | Freq: Two times a day (BID) | CUTANEOUS | 2 refills | Status: AC
Start: 2023-06-28 — End: 2023-11-12
  Filled 2023-06-28: qty 30, 15d supply, fill #0
  Filled 2023-07-14 – 2023-10-13 (×2): qty 30, 15d supply, fill #1
  Filled 2023-10-23 – 2023-10-24 (×2): qty 30, 15d supply, fill #2

## 2023-06-28 MED ORDER — CYCLOBENZAPRINE HCL 10 MG PO TABS
10.0000 mg | ORAL_TABLET | Freq: Three times a day (TID) | ORAL | 2 refills | Status: DC | PRN
Start: 2023-06-28 — End: 2023-11-24
  Filled 2023-06-28: qty 90, 30d supply, fill #0
  Filled 2023-07-14 – 2023-07-25 (×3): qty 90, 30d supply, fill #1
  Filled 2023-10-08: qty 90, 30d supply, fill #2

## 2023-06-28 NOTE — Progress Notes (Signed)
Rx sent for clobetasol propionate for vulvar lesion

## 2023-07-14 ENCOUNTER — Other Ambulatory Visit: Payer: Self-pay | Admitting: Nurse Practitioner

## 2023-07-14 ENCOUNTER — Other Ambulatory Visit: Payer: Self-pay

## 2023-07-14 ENCOUNTER — Other Ambulatory Visit: Payer: Self-pay | Admitting: Family Medicine

## 2023-07-14 DIAGNOSIS — B356 Tinea cruris: Secondary | ICD-10-CM

## 2023-07-14 DIAGNOSIS — L209 Atopic dermatitis, unspecified: Secondary | ICD-10-CM

## 2023-07-14 MED ORDER — NYSTATIN 100000 UNIT/GM EX CREA
1.0000 | TOPICAL_CREAM | Freq: Two times a day (BID) | CUTANEOUS | 0 refills | Status: DC
  Filled 2023-07-14 – 2023-07-23 (×2): qty 60, 30d supply, fill #0

## 2023-07-14 MED ORDER — TRIAMCINOLONE ACETONIDE 0.025 % EX OINT
1.0000 | TOPICAL_OINTMENT | Freq: Two times a day (BID) | CUTANEOUS | 0 refills | Status: DC
  Filled 2023-07-14: qty 30, 30d supply, fill #0
  Filled 2023-07-23: qty 15, 8d supply, fill #0
  Filled 2023-07-24: qty 45, 22d supply, fill #0

## 2023-07-14 NOTE — Telephone Encounter (Signed)
Requested medication (s) are due for refill today - yes  Requested medication (s) are on the active medication list -yes  Future visit scheduled -yes  Last refill: 04/12/23 60g  Notes to clinic: off protocol- provider review   Requested Prescriptions  Pending Prescriptions Disp Refills   nystatin cream (MYCOSTATIN) 60 g 0    Sig: Apply to breast area and groin 2 (two) times daily.     Off-Protocol Failed - 07/14/2023  8:16 AM      Failed - Medication not assigned to a protocol, review manually.      Passed - Valid encounter within last 12 months    Recent Outpatient Visits           1 month ago Primary hypertension   [facility] [place], Shea Stakes, NP   4 months ago Primary hypertension   [facility] [place], [place] W, NP   8 months ago Chronic right-sided low back pain without sciatica   [facility] [place], Shea Stakes, NP   1 year ago Tinea cruris   [facility] Claiborne Rigg, NP   1 year ago Primary hypertension   [facility] [place], Shea Stakes, NP                 Requested Prescriptions  Pending Prescriptions Disp Refills   nystatin cream (MYCOSTATIN) 60 g 0    Sig: Apply to breast area and groin 2 (two) times daily.     Off-Protocol Failed - 07/14/2023  8:16 AM      Failed - Medication not assigned to a protocol, review manually.      Passed - Valid encounter within last 12 months    Recent Outpatient Visits           1 month ago Primary hypertension   [facility] [place], Shea Stakes, NP   4 months ago Primary hypertension   [facility] [place], [place] W, NP   8 months ago Chronic right-sided low back pain without sciatica   [facility] [place], Shea Stakes, NP   1 year ago Tinea cruris   [facility] [place], Shea Stakes, NP   1 year ago Primary hypertension   [place] [facility] [place], Shea Stakes, NP

## 2023-07-14 NOTE — Telephone Encounter (Signed)
Requested medication (s) are due for refill today - yes  Requested medication (s) are on the active medication list -yes  Future visit scheduled -no  Last refill: 06/07/23 60g  Notes to clinic: non delegated Rx  Requested Prescriptions  Pending Prescriptions Disp Refills   triamcinolone (KENALOG) 0.025 % ointment 60 g 0    Sig: APPLY TO AFFECTED AREA TOPICALLY 2 (TWO) TIMES DAILY.     Not Delegated - Dermatology:  Corticosteroids Failed - 07/14/2023  8:16 AM      Failed - This refill cannot be delegated      Passed - Valid encounter within last 12 months    Recent Outpatient Visits           1 month ago Primary hypertension   [place] [facility] [place], Shea Stakes, NP   4 months ago Primary hypertension   [facility] [place], [place] W, NP   8 months ago Chronic right-sided low back pain without sciatica   [facility] Claiborne Rigg, NP   1 year ago Tinea cruris   [facility] Claiborne Rigg, NP   1 year ago Primary hypertension   [facility] [place], Shea Stakes, NP                 Requested Prescriptions  Pending Prescriptions Disp Refills   triamcinolone (KENALOG) 0.025 % ointment 60 g 0    Sig: APPLY TO AFFECTED AREA TOPICALLY 2 (TWO) TIMES DAILY.     Not Delegated - Dermatology:  Corticosteroids Failed - 07/14/2023  8:16 AM      Failed - This refill cannot be delegated      Passed - Valid encounter within last 12 months    Recent Outpatient Visits           1 month ago Primary hypertension   [place] [facility] [place], Shea Stakes, NP   4 months ago Primary hypertension   [place] [facility] [place], [place] W, NP   8 months ago Chronic right-sided low back pain without sciatica   [facility] [place], Shea Stakes, NP    1 year ago Tinea cruris    [facility] [place], Shea Stakes, NP   1 year ago Primary hypertension    [facility] [place], Shea Stakes, NP

## 2023-07-20 ENCOUNTER — Other Ambulatory Visit: Payer: Self-pay

## 2023-07-21 ENCOUNTER — Other Ambulatory Visit: Payer: Self-pay

## 2023-07-24 ENCOUNTER — Other Ambulatory Visit: Payer: Self-pay

## 2023-07-25 ENCOUNTER — Other Ambulatory Visit: Payer: Self-pay

## 2023-07-25 ENCOUNTER — Ambulatory Visit: Admission: RE | Admit: 2023-07-25 | Payer: Medicaid Other | Source: Ambulatory Visit

## 2023-07-25 DIAGNOSIS — Z1231 Encounter for screening mammogram for malignant neoplasm of breast: Secondary | ICD-10-CM

## 2023-07-28 ENCOUNTER — Other Ambulatory Visit (HOSPITAL_COMMUNITY): Payer: Self-pay

## 2023-10-08 ENCOUNTER — Other Ambulatory Visit: Payer: Self-pay | Admitting: Family Medicine

## 2023-10-08 DIAGNOSIS — B356 Tinea cruris: Secondary | ICD-10-CM

## 2023-10-08 DIAGNOSIS — L209 Atopic dermatitis, unspecified: Secondary | ICD-10-CM

## 2023-10-09 MED ORDER — NYSTATIN 100000 UNIT/GM EX CREA
1.0000 | TOPICAL_CREAM | Freq: Two times a day (BID) | CUTANEOUS | 0 refills | Status: DC
  Filled 2023-10-09: qty 60, 30d supply, fill #0

## 2023-10-09 MED ORDER — TRIAMCINOLONE ACETONIDE 0.025 % EX OINT
1.0000 | TOPICAL_OINTMENT | Freq: Two times a day (BID) | CUTANEOUS | 0 refills | Status: DC
  Filled 2023-10-09: qty 60, 30d supply, fill #0

## 2023-10-10 ENCOUNTER — Other Ambulatory Visit: Payer: Self-pay

## 2023-10-10 ENCOUNTER — Other Ambulatory Visit (HOSPITAL_COMMUNITY): Payer: Self-pay

## 2023-10-11 IMAGING — US IR EMBO TUMOR ORGAN ISCHEMIA INFARCT INC GUIDE ROADMAPPING
1 of 2 series · 12 of 24 positions shown · non-contrast
Comparison: none

INDICATION: 45-year-old female with uterine fibroids and menorrhagia presenting
for uterine artery embolization.

[Series 1: processed: ir embo tumor organ ischemia  · 14 acquisitions, 12 frames shown]
[im 1/14]
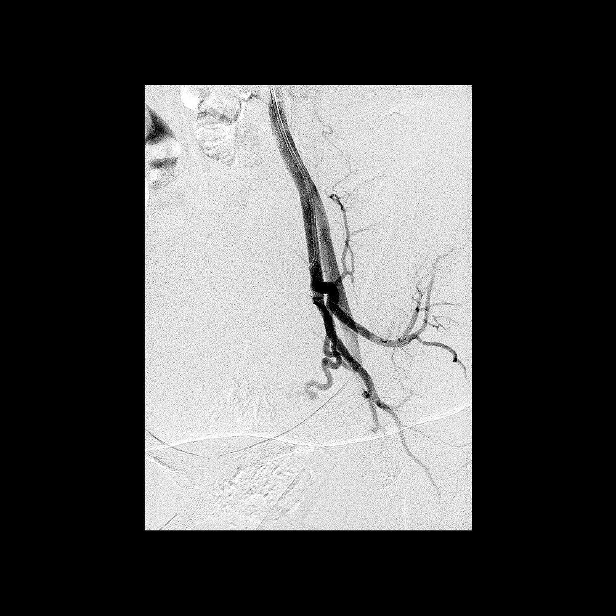
[im 2/14]
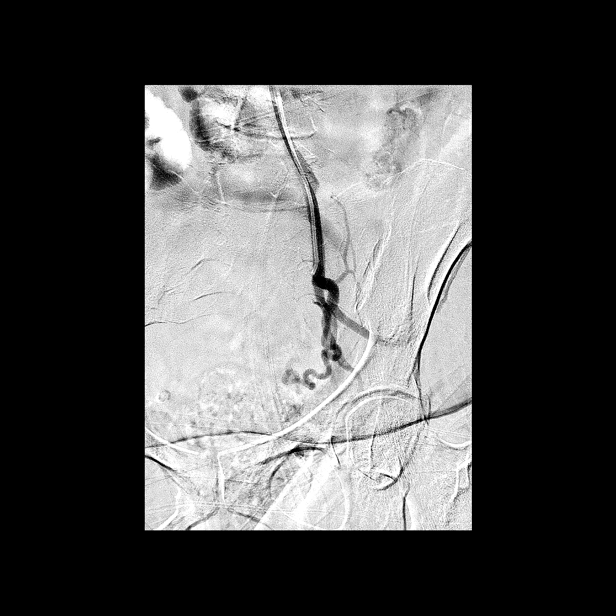
[im 3/14]
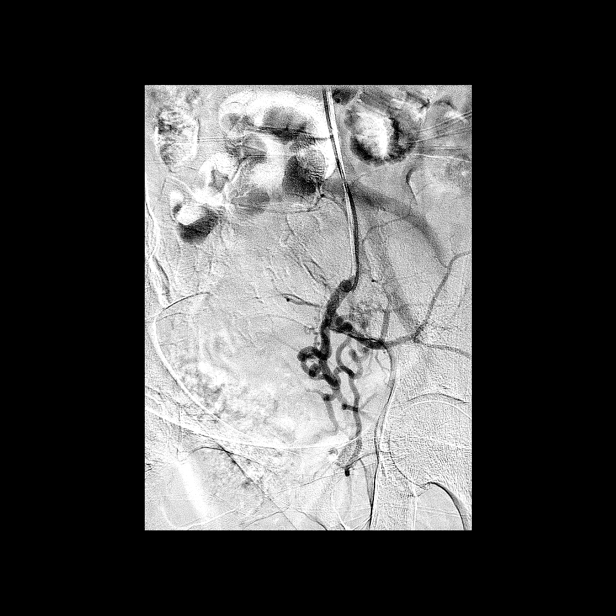
[im 4/14]
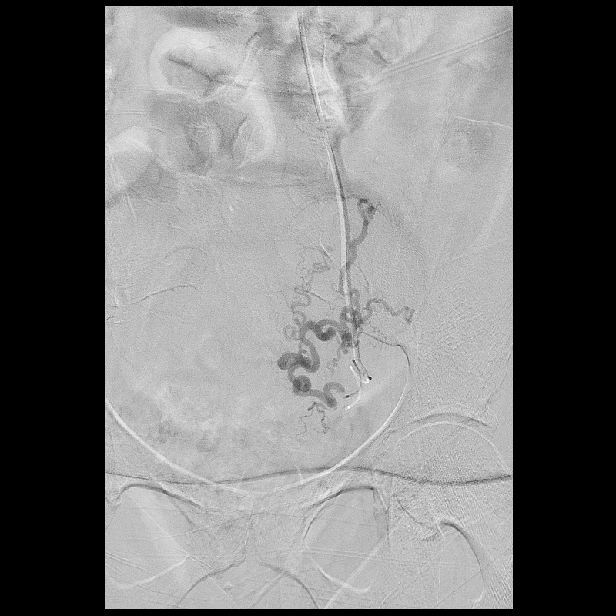
[im 6/14]
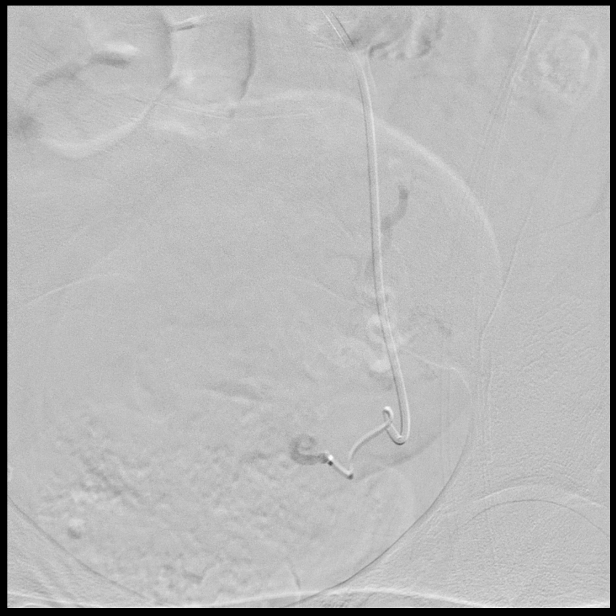
[im 7/14]
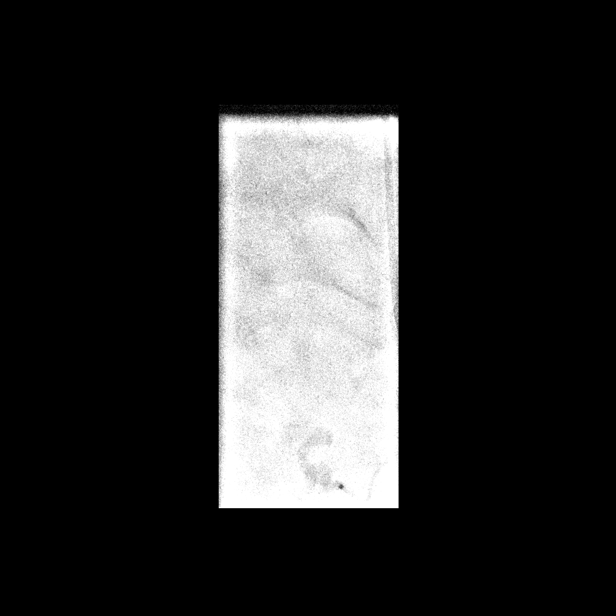
[im 8/14]
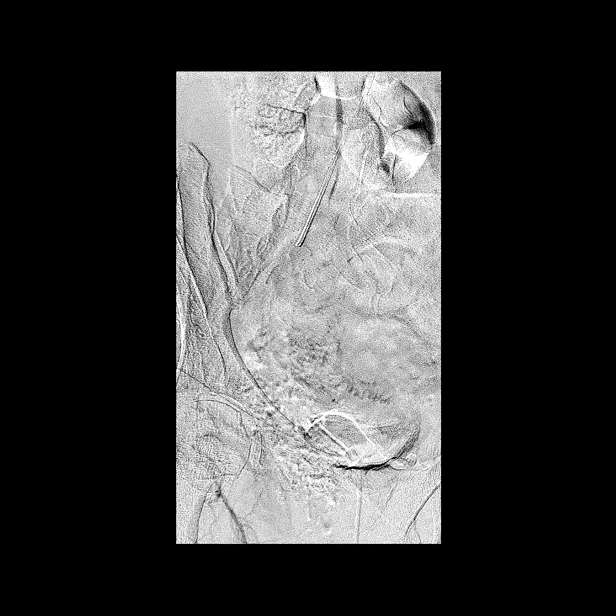
[im 10/14]
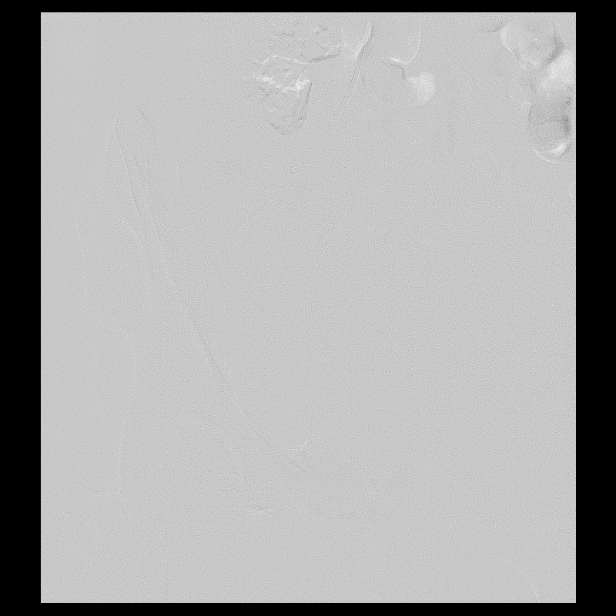
[im 11/14]
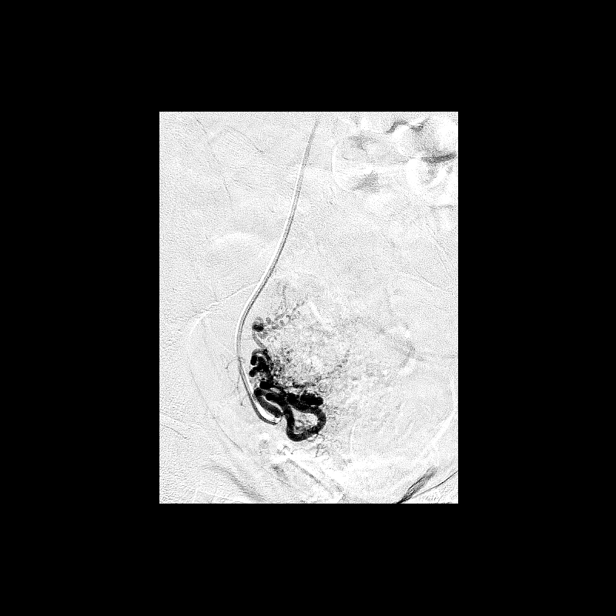
[im 12/14]
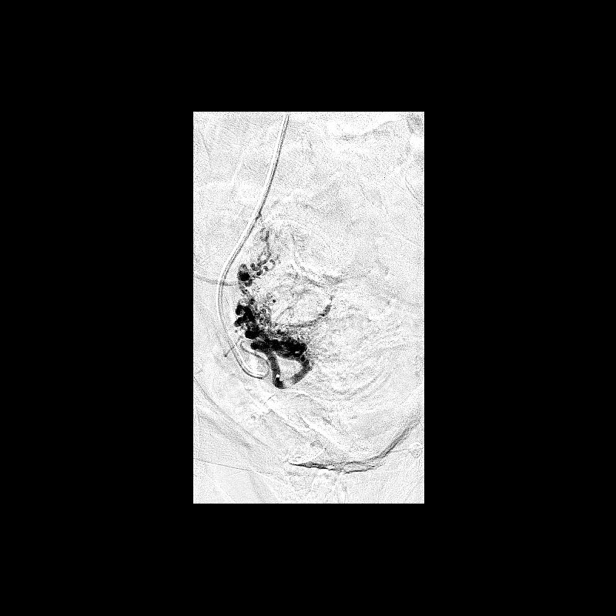
[im 13/14]
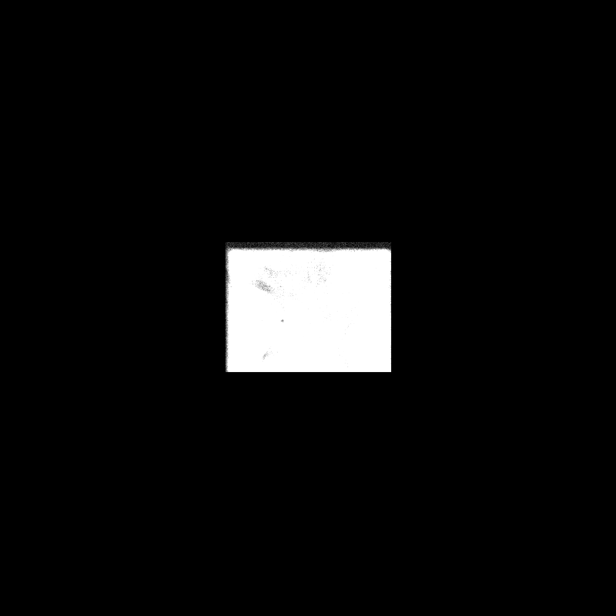
[im 14/14]
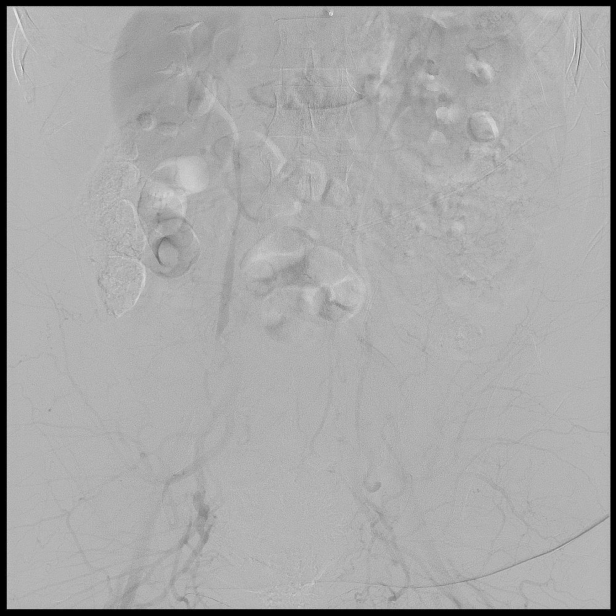

[12 of 24 positions shown; findings below may reference images not displayed]

EXAM:
1. Ultrasound-guided access of the left radial artery
2. Catheterization angiography of the left internal iliac artery
3. Selective catheterization angiography of the left uterine artery
4. Particle embolization of the left uterine artery
5. Catheterization angiography of the right internal iliac artery
6. Selective catheterization angiography of the right uterine artery
7. Particle embolization of the right uterine artery
8. Abdominal aortogram

MEDICATIONS:
Ancef 3 gm IV. The antibiotic was administered within 1 hour of the
procedure.

30 mg IV Toradol.

30 mg IM Toradol.

0.5 mg IV Dilaudid.

6 mL intraartieral lidocaine

ANESTHESIA/SEDATION:
Moderate (conscious) sedation was employed during this procedure. A
total of Versed 4 mg and Fentanyl 100 mcg was administered
intravenously.

Moderate Sedation Time: 106 minutes. The patient's level of
consciousness and vital signs were monitored continuously by
radiology nursing throughout the procedure under my direct
supervision.

FLUOROSCOPY TIME:  Fluoroscopy Time:  minutes  seconds ( mGy).

COMPLICATIONS:
None immediate.



A preliminary ultrasound of the left wrist was performed and
demonstrates a patent radial artery. A permanent ultrasound image
was recorded. The radial artery was measured for adequate size and [REDACTED] test was performed deemed in the left radial artery
appropriate vascular access. The overlying skin was anesthetized
with 1% lidocaine. Using ultrasound guidance, access into the left
radial artery was obtained with visualization of needle entry into
the vessel using standard micropuncture technique. A [DATE] French
slender sheath was placed without complication. A standard radial
cocktail was slowly administered.

A 5 French MG2 glidecatheter and a Bentson wire were directed under
direct fluoroscopic guidance through the left upper extremity and to
the left subclavian artery and descending thoracic aorta. The
catheter was used to select the left common iliac artery and
subsequently the internal iliac artery. Angiography was performed
which demonstrated an enlarged tortuous uterine artery. The MG2
catheter was advanced over a Glidewire to select the proximal left
uterine artery. A renegade Escane Kenworthy microcatheter and fathom 16
microwire were then used to select the left uterine artery.
Selective angiography demonstrates filling of the uterine artery and
hypervascular masses compatible with patient's known fibroids. No
extra uterine branches were seen. Embolization of the uterine artery
was performed using 500-700 and 700-900 micron microspheres. Post
embolization angiography demonstrates sluggish flow with no evidence
of filling to the uterine fibroids.

The MG2 catheter was then withdrawn and used to select the right
common iliac artery and right internal iliac artery. Angiography was
performed which demonstrated an enlarged tortuous uterine artery.
The MG2 catheter was advanced over a Glidewire to select the
proximal right uterine artery. A renegade Escane Kenworthy microcatheter and
fathom 16 microwire were then used to select the right uterine
artery. Selective angiography demonstrates filling of the uterine
artery and hypervascular masses compatible with patient's known
fibroids. No extra uterine branches were seen. Embolization of the
uterine artery was performed using 500-700 micron microspheres. Post
embolization angiography demonstrates sluggish flow with no evidence
of filling to the uterine fibroids.

The MG2 catheter was withdrawn to the mid abdominal aorta. An
aortogram and pelvic arteriogram was then performed. The images
demonstrate the abdominal aorta and iliac vessels to be normal in
course and caliber. The ovarian arteries are not enlarged without
evidence of significant supply to the uterus. The catheter was
removed.

A TR band was placed over the left wrist and after insufflation the
sheath was removed. The TR band balloon was slightly deflated and
then reinflated to achieve adequate hemostatic pressure. The patient
tolerated the procedure well without immediate complication was
transferred to the recovery area in good condition.
IMPRESSION: Successful bilateral uterine artery embolization for treatment of
symptomatic uterine fibroids.

## 2023-10-12 ENCOUNTER — Other Ambulatory Visit: Payer: Self-pay

## 2023-10-12 ENCOUNTER — Ambulatory Visit
Admission: EM | Admit: 2023-10-12 | Discharge: 2023-10-12 | Disposition: A | Discharge status: Home / Self Care | Payer: Medicaid Other | Attending: Internal Medicine | Admitting: Internal Medicine

## 2023-10-12 DIAGNOSIS — K0889 Other specified disorders of teeth and supporting structures: Secondary | ICD-10-CM

## 2023-10-12 DIAGNOSIS — K047 Periapical abscess without sinus: Secondary | ICD-10-CM

## 2023-10-12 MED ORDER — LIDOCAINE VISCOUS HCL 2 % MT SOLN
15.0000 mL | Freq: Four times a day (QID) | OROMUCOSAL | 0 refills | Status: DC | PRN
  Filled 2023-10-12: qty 100, 2d supply, fill #0

## 2023-10-12 MED ORDER — AMOXICILLIN-POT CLAVULANATE 875-125 MG PO TABS
1.0000 | ORAL_TABLET | Freq: Two times a day (BID) | ORAL | 0 refills | Status: DC
  Filled 2023-10-12: qty 14, 7d supply, fill #0

## 2023-10-12 NOTE — ED Provider Notes (Signed)
[facility]    CSN: [id number] Arrival date & time: 10/12/23  1807      History   Chief Complaint Chief Complaint  Patient presents with   Dental Pain    HPI Monica Brennan is a 47 y.o. female.   Patient presents with upper dental pain for approximately 1.5 weeks.  Denies any injury to the area.  Reports previous history of chipped teeth and dental pain.  She attempted to make appointment with dentist today but they did not have any availability until January.  She has taken ibuprofen at home with minimal improvement.   Dental Pain   Past Medical History:  Diagnosis Date   Hypertension Dx 2006   previously, on lisinopril. stopped lisinopril when medicaid ran out in 2007.    Morbid obesity (HCC)    Prediabetes     Patient Active Problem List   Diagnosis Date Noted   Tinea cruris 01/20/2017   Fibroid uterus 06/24/2016   Lipoma of back 06/16/2016   Right-sided low back pain without sciatica 06/16/2016   Pelvic pain in female 06/16/2016   Menopausal symptoms 08/28/2015   Hypertension 09/09/2014   Body mass index (BMI) 40.0-44.9, adult (HCC) 09/09/2014    Past Surgical History:  Procedure Laterality Date   CESAREAN SECTION     x 3, 1998,2000, 2006   COLONOSCOPY     DILATION AND CURETTAGE, DIAGNOSTIC / THERAPEUTIC     due to miscarriage   FOOT SURGERY Left 04/2021   Bone spur   IR ANGIOGRAM PELVIS SELECTIVE OR SUPRASELECTIVE  11/03/2021   IR ANGIOGRAM PELVIS SELECTIVE OR SUPRASELECTIVE  11/03/2021   IR ANGIOGRAM SELECTIVE EACH ADDITIONAL VESSEL  11/03/2021   IR ANGIOGRAM SELECTIVE EACH ADDITIONAL VESSEL  11/03/2021   IR EMBO TUMOR ORGAN ISCHEMIA INFARCT INC GUIDE ROADMAPPING  11/03/2021   IR RADIOLOGIST EVAL & MGMT  09/22/2021   IR RADIOLOGIST EVAL & MGMT  11/18/2021   IR RADIOLOGIST EVAL & MGMT  03/03/2022   IR US GUIDE VASC ACCESS LEFT  11/03/2021   TUBAL LIGATION  12/05/2004    OB History     Gravida  4   Para  3   Term  2    Preterm  1   AB  1   Living  3      SAB  1   IAB      Ectopic      Multiple      Live Births  3            Home Medications    Prior to Admission medications   Medication Sig Start Date End Date Taking? Authorizing Provider  amoxicillin-clavulanate (AUGMENTIN) 875-125 MG tablet Take 1 tablet by mouth every 12 (twelve) hours. 10/12/23  Yes Janielle Mittelstadt, Rolly Salter E, FNP  lidocaine (XYLOCAINE) 2 % solution Use as directed 15 mLs in the mouth or throat every 6 (six) hours as needed for mouth pain. 10/12/23  Yes Rasean Joos, Rolly Salter E, FNP  amLODipine (NORVASC) 10 MG tablet Take 1 tablet (10 mg total) by mouth daily. 06/07/23   Claiborne Rigg, NP  cyclobenzaprine (FLEXERIL) 10 MG tablet Take 1 tablet (10 mg total) by mouth 3 (three) times daily as needed for muscle spasms. 06/28/23   Hoy Register, MD  losartan (COZAAR) 25 MG tablet Take 1 tablet (25 mg total) by mouth daily. 06/07/23   Claiborne Rigg, NP  nystatin cream (MYCOSTATIN) Apply to breast area and groin 2 (two) times daily. 10/09/23  Hoy Register, MD  omeprazole (PRILOSEC) 20 MG capsule Take 1 capsule (20 mg total) by mouth daily. 06/07/23   Claiborne Rigg, NP  triamcinolone (KENALOG) 0.025 % ointment Apply 1 Application topically 2 (two) times daily. 10/09/23   Hoy Register, MD    Family History Family History  Problem Relation Age of Onset   Hypertension Mother    Heart disease Mother    Sleep apnea Mother    Depression Mother    Hypertension Father    Hypertension Sister    Anxiety disorder Sister    Depression Sister    Hypertension Sister    Anxiety disorder Sister    Depression Sister    Diabetes Brother    Hypertension Brother    Anxiety disorder Brother    Depression Brother    Diabetes Brother    Hypertension Brother    Depression Brother    Diabetes Maternal Aunt    Cancer Maternal Aunt        lung    Hypertension Maternal Grandmother    ADD / ADHD Daughter    Depression Daughter    Diabetes Son     Thyroid disease Son    ADD / ADHD Son    Epilepsy Son    Autism Son    Heart defect Nephew    Colon cancer Neg Hx    Esophageal cancer Neg Hx    Rectal cancer Neg Hx    Stomach cancer Neg Hx     Social History Social History   Tobacco Use   Smoking status: Never   Smokeless tobacco: Never  Vaping Use   Vaping status: Never Used  Substance Use Topics   Alcohol use: No   Drug use: No     Allergies   Patient has no known allergies.   Review of Systems Review of Systems Per HPI  Physical Exam Triage Vital Signs ED Triage Vitals  Encounter Vitals Group     BP 10/12/23 1816 (!) 138/90     Systolic BP Percentile --      Diastolic BP Percentile --      Pulse Rate 10/12/23 1816 80     Resp 10/12/23 1816 16     Temp 10/12/23 1816 98 F (36.7 C)     Temp Source 10/12/23 1816 Oral     SpO2 10/12/23 1816 98 %     Weight --      Height --      Head Circumference --      Peak Flow --      Pain Score 10/12/23 1818 10     Pain Loc --      Pain Education --      Exclude from Growth Chart --    No data found.  Updated Vital Signs BP (!) 138/90 (BP Location: Left Arm)   Pulse 80   Temp 98 F (36.7 C) (Oral)   Resp 16   LMP 07/22/2023 (Exact Date)   SpO2 98%   Visual Acuity Right Eye Distance:   Left Eye Distance:   Bilateral Distance:    Right Eye Near:   Left Eye Near:    Bilateral Near:     Physical Exam Constitutional:      General: She is not in acute distress.    Appearance: Normal appearance. She is not toxic-appearing or diaphoretic.  HENT:     Head: Normocephalic and atraumatic.     Mouth/Throat:     Comments: Patient missing multiple teeth  in upper dentition as well as multiple chipped teeth.  Patient has surrounding swelling and mild erythema present to left upper dentition and right upper back dentition. No visible abscess. Eyes:     Extraocular Movements: Extraocular movements intact.     Conjunctiva/sclera: Conjunctivae normal.   Pulmonary:     Effort: Pulmonary effort is normal.  Neurological:     General: No focal deficit present.     Mental Status: She is alert and oriented to person, place, and time. Mental status is at baseline.  Psychiatric:        Mood and Affect: Mood normal.        Behavior: Behavior normal.        Thought Content: Thought content normal.        Judgment: Judgment normal.      UC Treatments / Results  Labs (all labs ordered are listed, but only abnormal results are displayed) Labs Reviewed - No data to display  EKG   Radiology No results found.  Procedures Procedures (including critical care time)  Medications Ordered in UC Medications - No data to display  Initial Impression / Assessment and Plan / UC Course  I have reviewed the triage vital signs and the nursing notes.  Pertinent labs & imaging results that were available during my care of the patient were reviewed by me and considered in my medical decision making (see chart for details).     Will treat dental infection with Augmentin antibiotic.  Viscous lidocaine solution also prescribed to apply directly to area.  Patient provided with dental resources paperwork for follow-up for further evaluation and management.  Advised return precautions.  Patient verbalized understanding and was agreeable with plan. Final Clinical Impressions(s) / UC Diagnoses   Final diagnoses:  Dental infection  Pain, dental     Discharge Instructions      I have prescribed you antibiotic for dental infection.  You were also given a lidocaine solution to apply directly to area of pain.  Follow-up with dentist for further evaluation and management.    ED Prescriptions     Medication Sig Dispense Auth. Provider   amoxicillin-clavulanate (AUGMENTIN) 875-125 MG tablet Take 1 tablet by mouth every 12 (twelve) hours. 14 tablet [place], [place], [place]   lidocaine (XYLOCAINE) 2 % solution Use as directed 15 mLs in the mouth or throat every  6 (six) hours as needed for mouth pain. 100 mL Gustavus Bryant, [place]      PDMP not reviewed this encounter.   Gustavus Bryant, [place] 10/12/23 [phone]

## 2023-10-12 NOTE — ED Triage Notes (Signed)
Pt states dental pain for the past 10 days. States she has several broken teeth. State she is taking motrin at home with some relief.

## 2023-10-12 NOTE — Discharge Instructions (Signed)
I have prescribed you antibiotic for dental infection.  You were also given a lidocaine solution to apply directly to area of pain.  Follow-up with dentist for further evaluation and management.

## 2023-10-13 ENCOUNTER — Other Ambulatory Visit: Payer: Self-pay

## 2023-10-23 ENCOUNTER — Other Ambulatory Visit: Payer: Self-pay

## 2023-10-24 ENCOUNTER — Other Ambulatory Visit: Payer: Self-pay

## 2023-10-25 ENCOUNTER — Ambulatory Visit: Payer: Medicaid Other | Admitting: Nurse Practitioner

## 2023-11-17 ENCOUNTER — Encounter: Payer: Self-pay | Admitting: *Deleted

## 2023-11-17 ENCOUNTER — Ambulatory Visit
Admission: EM | Admit: 2023-11-17 | Discharge: 2023-11-17 | Disposition: A | Discharge status: Home / Self Care | Payer: Medicaid Other | Attending: Emergency Medicine | Admitting: Emergency Medicine

## 2023-11-17 ENCOUNTER — Other Ambulatory Visit: Payer: Self-pay

## 2023-11-17 DIAGNOSIS — K029 Dental caries, unspecified: Secondary | ICD-10-CM

## 2023-11-17 DIAGNOSIS — K047 Periapical abscess without sinus: Secondary | ICD-10-CM

## 2023-11-17 DIAGNOSIS — M62830 Muscle spasm of back: Secondary | ICD-10-CM

## 2023-11-17 DIAGNOSIS — M545 Low back pain, unspecified: Secondary | ICD-10-CM | POA: Diagnosis not present

## 2023-11-17 MED ORDER — BACLOFEN 10 MG PO TABS: 10.0000 mg | ORAL_TABLET | Freq: Every day | ORAL | 0 refills | Status: AC

## 2023-11-17 MED ORDER — PENICILLIN V POTASSIUM 500 MG PO TABS: 500.0000 mg | ORAL_TABLET | Freq: Three times a day (TID) | ORAL | 0 refills | Status: DC

## 2023-11-17 MED ORDER — ACETAMINOPHEN 500 MG PO TABS: 1000.0000 mg | ORAL_TABLET | Freq: Three times a day (TID) | ORAL | 0 refills | Status: AC

## 2023-11-17 MED ORDER — IBUPROFEN 800 MG PO TABS: 800.0000 mg | ORAL_TABLET | Freq: Three times a day (TID) | ORAL | 0 refills | Status: AC | PRN

## 2023-11-17 NOTE — ED Triage Notes (Signed)
Pt has dentist appt in January. She c/o pain in upper teeth for months" that comes and goes. Pain has been worse over the last 1.5 weeks. Reports difficult to eat due to pain

## 2023-11-17 NOTE — Discharge Instructions (Signed)
The mainstay of therapy for musculoskeletal pain is reduction of inflammation and relaxation of tension which is causing inflammation.  Keep in mind, pain always begets more pain.  To help you stay ahead of your pain and inflammation and to resolve the infection in your teeth, I have provided the following regimen for you:   When you pick up your prescription from the pharmacy, please begin taking Tylenol 975 mg 3 times daily (every 8 hours).  Please know that It is safe to take a maximum 3000 mg of Tylenol in a 24-hour period.  Please do not exceed this amount.  Tylenol works best when taken on a scheduled basis.  It is safe to take this at the same time as you take ibuprofen   When you pick up your prescription from the pharmacy, please begin taking ibuprofen 800 mg 3 times daily.  Please keep in mind that it is always easier to treat a little bit of pain that is to treat a lot of pain.  I recommend that for the next several days, you take this medication on a scheduled basis.  After that, take it when you begin to feel the pain returning, do not wait until you are in a lot of pain.  It is safe to take this at the same time as you take Tylenol.  This evening, please begin taking baclofen 10 mg.  This is a highly effective muscle relaxer and antispasmodic which should continue to provide you with relaxation of your tense muscles, allow you to sleep well and to keep your pain under control.  Penicillin V: This is the antibiotic that will be used to treat the infection in your teeth.  When you pick up your prescription from the pharmacy, please begin taking one (1) dose three times daily for 14 days.  This antibiotic can cause upset stomach, this will resolve once antibiotics are complete.  You are welcome to take a probiotic, eat yogurt, take Imodium while taking this medication.  Please avoid other systemic medications such as Maalox, Pepto-Bismol or milk of magnesia as they can interfere with the body's  ability to absorb the antibiotics.  Thank you for visiting [facility] today.  We appreciate the opportunity to participate in your care.

## 2023-11-17 NOTE — ED Provider Notes (Signed)
[facility]    CSN: [id number] Arrival date & time: 11/17/23  1645    HISTORY   Chief Complaint  Patient presents with   Dental Pain   HPI Monica Brennan is a pleasant, 47 y.o. female who presents to urgent care today. Pt has dentist appt in January. She c/o pain in upper teeth for months" that comes and goes. Pain has been worse over the last 1.5 weeks. Reports difficult to eat due to pain.  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  Patient states this pain is chronic and is requesting something for pain that is become a little bit worse over the past few weeks.  The history is provided by the patient.   Past Medical History:  Diagnosis Date   Hypertension Dx 2006   previously, on lisinopril. stopped lisinopril when medicaid ran out in 2007.    Morbid obesity (HCC)    Prediabetes    Patient Active Problem List   Diagnosis Date Noted   Tinea cruris 01/20/2017   Fibroid uterus 06/24/2016   Lipoma of back 06/16/2016   Right-sided low back pain without sciatica 06/16/2016   Pelvic pain in female 06/16/2016   Menopausal symptoms 08/28/2015   Hypertension 09/09/2014   Body mass index (BMI) 40.0-44.9, adult (HCC) 09/09/2014   Past Surgical History:  Procedure Laterality Date   CESAREAN SECTION     x 3, 1998,2000, 2006   COLONOSCOPY     DILATION AND CURETTAGE, DIAGNOSTIC / THERAPEUTIC     due to miscarriage   FOOT SURGERY Left 04/2021   Bone spur   IR ANGIOGRAM PELVIS SELECTIVE OR SUPRASELECTIVE  11/03/2021   IR ANGIOGRAM PELVIS SELECTIVE OR SUPRASELECTIVE  11/03/2021   IR ANGIOGRAM SELECTIVE EACH ADDITIONAL VESSEL  11/03/2021   IR ANGIOGRAM SELECTIVE EACH ADDITIONAL VESSEL  11/03/2021   IR EMBO TUMOR ORGAN ISCHEMIA INFARCT INC GUIDE ROADMAPPING  11/03/2021   IR RADIOLOGIST EVAL & MGMT  09/22/2021   IR RADIOLOGIST EVAL & MGMT  11/18/2021   IR RADIOLOGIST EVAL & MGMT  03/03/2022   IR US GUIDE  VASC ACCESS LEFT  11/03/2021   TUBAL LIGATION  12/05/2004   OB History     Gravida  4   Para  3   Term  2   Preterm  1   AB  1   Living  3      SAB  1   IAB      Ectopic      Multiple      Live Births  3          Home Medications    Prior to Admission medications   Medication Sig Start Date End Date Taking? Authorizing Provider  acetaminophen (TYLENOL) 500 MG tablet Take 2 tablets (1,000 mg total) by mouth every 8 (eight) hours for 14 days. 11/17/23 12/01/23 Yes Theadora Rama Scales, PA-C  amLODipine (NORVASC) 10 MG tablet Take 1 tablet (10 mg total) by mouth daily. 06/07/23  Yes Claiborne Rigg, NP  baclofen (LIORESAL) 10 MG tablet Take 1 tablet (10 mg total) by mouth at bedtime for 7 days. 11/17/23 11/24/23 Yes Theadora Rama Scales, PA-C  cyclobenzaprine (FLEXERIL) 10 MG tablet Take 1 tablet (10 mg total) by mouth 3 (three) times daily as needed for muscle spasms. 06/28/23  Yes Hoy Register, MD  ibuprofen (ADVIL) 800 MG tablet Take 1 tablet (800 mg total) by mouth every 8 (  eight) hours as needed for up to 14 days. 11/17/23 12/01/23 Yes Theadora Rama Scales, PA-C  lidocaine (XYLOCAINE) 2 % solution Use as directed 15 mLs in the mouth or throat every 6 (six) hours as needed for mouth pain. 10/12/23  Yes Mound, Acie Fredrickson, FNP  losartan (COZAAR) 25 MG tablet Take 1 tablet (25 mg total) by mouth daily. 06/07/23  Yes Claiborne Rigg, NP  nystatin cream (MYCOSTATIN) Apply to breast area and groin 2 (two) times daily. 10/09/23  Yes Hoy Register, MD  omeprazole (PRILOSEC) 20 MG capsule Take 1 capsule (20 mg total) by mouth daily. 06/07/23  Yes Claiborne Rigg, NP  penicillin v potassium (VEETID) 500 MG tablet Take 1 tablet (500 mg total) by mouth 3 (three) times daily for 14 days. 11/17/23 12/01/23 Yes Theadora Rama Scales, PA-C  triamcinolone (KENALOG) 0.025 % ointment Apply 1 Application topically 2 (two) times daily. 10/09/23  Yes Hoy Register, MD    Family  History Family History  Problem Relation Age of Onset   Hypertension Mother    Heart disease Mother    Sleep apnea Mother    Depression Mother    Hypertension Father    Hypertension Sister    Anxiety disorder Sister    Depression Sister    Hypertension Sister    Anxiety disorder Sister    Depression Sister    Diabetes Brother    Hypertension Brother    Anxiety disorder Brother    Depression Brother    Diabetes Brother    Hypertension Brother    Depression Brother    Hypertension Maternal Grandmother    ADD / ADHD Daughter    Depression Daughter    Diabetes Son    Thyroid disease Son    ADD / ADHD Son    Epilepsy Son    Autism Son    Diabetes Maternal Aunt    Cancer Maternal Aunt        lung    Heart defect Nephew    Colon cancer Neg Hx    Esophageal cancer Neg Hx    Rectal cancer Neg Hx    Stomach cancer Neg Hx    Social History Social History   Tobacco Use   Smoking status: Never   Smokeless tobacco: Never  Vaping Use   Vaping status: Never Used  Substance Use Topics   Alcohol use: No   Drug use: No   Allergies   Patient has no known allergies.  Review of Systems Review of Systems Pertinent findings revealed after performing a 14 point review of systems has been noted in the history of present illness.  Physical Exam Vital Signs BP (!) 151/83 (BP Location: Left Arm)   Pulse 79   Temp 98.2 F (36.8 C) (Oral)   Resp 18   LMP 11/05/2023 (Approximate)   SpO2 98%   No data found.  Physical Exam Vitals and nursing note reviewed.  Constitutional:      General: She is not in acute distress.    Appearance: Normal appearance. She is not ill-appearing.  HENT:     Head: Normocephalic and atraumatic.     Salivary Glands: Right salivary gland is not diffusely enlarged or tender. Left salivary gland is not diffusely enlarged or tender.     Right Ear: Tympanic membrane, ear canal and external ear normal. No drainage. No middle ear effusion. There is no  impacted cerumen. Tympanic membrane is not erythematous or bulging.     Left Ear: Tympanic membrane,  ear canal and external ear normal. No drainage.  No middle ear effusion. There is no impacted cerumen. Tympanic membrane is not erythematous or bulging.     Nose: Nose normal. No nasal deformity, septal deviation, mucosal edema, congestion or rhinorrhea.     Right Turbinates: Not enlarged, swollen or pale.     Left Turbinates: Not enlarged, swollen or pale.     Right Sinus: No maxillary sinus tenderness or frontal sinus tenderness.     Left Sinus: No maxillary sinus tenderness or frontal sinus tenderness.     Mouth/Throat:     Lips: Pink. No lesions.     Mouth: Mucous membranes are moist. No oral lesions.     Dentition: Dental tenderness, gingival swelling, dental caries and dental abscesses present. No gum lesions.     Pharynx: Oropharynx is clear. Uvula midline. No posterior oropharyngeal erythema or uvula swelling.     Tonsils: No tonsillar exudate. 0 on the right. 0 on the left.  Eyes:     General: Lids are normal.        Right eye: No discharge.        Left eye: No discharge.     Extraocular Movements: Extraocular movements intact.     Conjunctiva/sclera: Conjunctivae normal.     Right eye: Right conjunctiva is not injected.     Left eye: Left conjunctiva is not injected.  Neck:     Trachea: Trachea and phonation normal.  Cardiovascular:     Rate and Rhythm: Normal rate and regular rhythm.     Pulses: Normal pulses.     Heart sounds: Normal heart sounds. No murmur heard.    No friction rub. No gallop.  Pulmonary:     Effort: Pulmonary effort is normal. No accessory muscle usage, prolonged expiration or respiratory distress.     Breath sounds: Normal breath sounds. No stridor, decreased air movement or transmitted upper airway sounds. No decreased breath sounds, wheezing, rhonchi or rales.  Chest:     Chest wall: No tenderness.  Musculoskeletal:        General: Normal range of  motion.     Cervical back: Normal range of motion and neck supple. Normal range of motion.     Lumbar back: Spasms and tenderness present.  Lymphadenopathy:     Cervical: No cervical adenopathy.  Skin:    General: Skin is warm and dry.     Findings: No erythema or rash.  Neurological:     General: No focal deficit present.     Mental Status: She is alert and oriented to person, place, and time.  Psychiatric:        Mood and Affect: Mood normal.        Behavior: Behavior normal.     Visual Acuity Right Eye Distance:   Left Eye Distance:   Bilateral Distance:    Right Eye Near:   Left Eye Near:    Bilateral Near:     UC Couse / Diagnostics / Procedures:     Radiology No results found.  Procedures Procedures (including critical care time) EKG  Pending results:  Labs Reviewed - No data to display  Medications Ordered in UC: Medications - No data to display  UC Diagnoses / Final Clinical Impressions(s)   I have reviewed the triage vital signs and the nursing notes.  Pertinent labs & imaging results that were available during my care of the patient were reviewed by me and considered in my medical decision making (see chart  for details).    Final diagnoses:  Infected dental caries  Acute bilateral low back pain without sciatica  Spasm of lumbar paraspinous muscle   Patient provided with a 14-day course of penicillin for empiric treatment of presumed bacterial infection and dental caries.  Patient provided with ibuprofen 800 mg and Tylenol 1000 mg for relief of pain.  Patient provided with baclofen for relief of muscle spasm and lower back.  Conservative care recommended.  Return precautions advised.  Please see discharge instructions below for details of plan of care as provided to patient. ED Prescriptions     Medication Sig Dispense Auth. Provider   acetaminophen (TYLENOL) 500 MG tablet Take 2 tablets (1,000 mg total) by mouth every 8 (eight) hours for 14 days. 84  tablet Theadora Rama Scales, PA-C   ibuprofen (ADVIL) 800 MG tablet Take 1 tablet (800 mg total) by mouth every 8 (eight) hours as needed for up to 14 days. 42 tablet Theadora Rama Scales, PA-C   penicillin v potassium (VEETID) 500 MG tablet Take 1 tablet (500 mg total) by mouth 3 (three) times daily for 14 days. 42 tablet Theadora Rama Scales, PA-C   baclofen (LIORESAL) 10 MG tablet Take 1 tablet (10 mg total) by mouth at bedtime for 7 days. 7 tablet Theadora Rama Scales, PA-C      PDMP not reviewed this encounter.  Pending results:  Labs Reviewed - No data to display    Discharge Instructions      The mainstay of therapy for musculoskeletal pain is reduction of inflammation and relaxation of tension which is causing inflammation.  Keep in mind, pain always begets more pain.  To help you stay ahead of your pain and inflammation and to resolve the infection in your teeth, I have provided the following regimen for you:   When you pick up your prescription from the pharmacy, please begin taking Tylenol 975 mg 3 times daily (every 8 hours).  Please know that It is safe to take a maximum 3000 mg of Tylenol in a 24-hour period.  Please do not exceed this amount.  Tylenol works best when taken on a scheduled basis.  It is safe to take this at the same time as you take ibuprofen   When you pick up your prescription from the pharmacy, please begin taking ibuprofen 800 mg 3 times daily.  Please keep in mind that it is always easier to treat a little bit of pain that is to treat a lot of pain.  I recommend that for the next several days, you take this medication on a scheduled basis.  After that, take it when you begin to feel the pain returning, do not wait until you are in a lot of pain.  It is safe to take this at the same time as you take Tylenol.  This evening, please begin taking baclofen 10 mg.  This is a highly effective muscle relaxer and antispasmodic which should continue to provide  you with relaxation of your tense muscles, allow you to sleep well and to keep your pain under control.  Penicillin V: This is the antibiotic that will be used to treat the infection in your teeth.  When you pick up your prescription from the pharmacy, please begin taking one (1) dose three times daily for 14 days.  This antibiotic can cause upset stomach, this will resolve once antibiotics are complete.  You are welcome to take a probiotic, eat yogurt, take Imodium while taking this medication.  Please avoid other systemic medications such as Maalox, Pepto-Bismol or milk of magnesia as they can interfere with the body's ability to absorb the antibiotics.  Thank you for visiting [facility] today.  We appreciate the opportunity to participate in your care.         Disposition Upon Discharge:  Condition: stable for discharge home  Patient presented with an acute illness with associated systemic symptoms and significant discomfort requiring urgent management. In my opinion, this is a condition that a prudent lay person (someone who possesses an average knowledge of health and medicine) may potentially expect to result in complications if not addressed urgently such as respiratory distress, impairment of bodily function or dysfunction of bodily organs.   Routine symptom specific, illness specific and/or disease specific instructions were discussed with the patient and/or caregiver at length.   As such, the patient has been evaluated and assessed, work-up was performed and treatment was provided in alignment with urgent care protocols and evidence based medicine.  Patient/parent/caregiver has been advised that the patient may require follow up for further testing and treatment if the symptoms continue in spite of treatment, as clinically indicated and appropriate.  Patient/parent/caregiver has been advised to return to the [facility] or PCP if no better; to PCP or the Emergency Department if  new signs and symptoms develop, or if the current signs or symptoms continue to change or worsen for further workup, evaluation and treatment as clinically indicated and appropriate  The patient will follow up with their current PCP if and as advised. If the patient does not currently have a PCP we will assist them in obtaining one.   The patient may need specialty follow up if the symptoms continue, in spite of conservative treatment and management, for further workup, evaluation, consultation and treatment as clinically indicated and appropriate.  Patient/parent/caregiver verbalized understanding and agreement of plan as discussed.  All questions were addressed during visit.  Please see discharge instructions below for further details of plan.  This office note has been dictated using Teaching laboratory technician.  Unfortunately, this method of dictation can sometimes lead to typographical or grammatical errors.  I apologize for your inconvenience in advance if this occurs.  Please do not hesitate to reach out to me if clarification is needed.      Theadora Rama Scales, PA-C 11/17/23 2216

## 2023-11-24 ENCOUNTER — Other Ambulatory Visit (HOSPITAL_COMMUNITY): Payer: Self-pay | Admitting: Internal Medicine

## 2023-11-24 ENCOUNTER — Other Ambulatory Visit: Payer: Self-pay | Admitting: Family Medicine

## 2023-11-24 ENCOUNTER — Other Ambulatory Visit (HOSPITAL_COMMUNITY): Payer: Self-pay

## 2023-11-24 ENCOUNTER — Other Ambulatory Visit: Payer: Self-pay

## 2023-11-24 DIAGNOSIS — B356 Tinea cruris: Secondary | ICD-10-CM

## 2023-11-24 DIAGNOSIS — G8929 Other chronic pain: Secondary | ICD-10-CM

## 2023-11-24 DIAGNOSIS — L209 Atopic dermatitis, unspecified: Secondary | ICD-10-CM

## 2023-11-24 MED ORDER — CYCLOBENZAPRINE HCL 10 MG PO TABS
10.0000 mg | ORAL_TABLET | Freq: Three times a day (TID) | ORAL | 0 refills | Status: DC | PRN
  Filled 2023-11-24: qty 90, 30d supply, fill #0

## 2023-11-24 MED ORDER — TRIAMCINOLONE ACETONIDE 0.025 % EX OINT
1.0000 | TOPICAL_OINTMENT | Freq: Two times a day (BID) | CUTANEOUS | 0 refills | Status: DC
  Filled 2023-11-24: qty 60, 30d supply, fill #0

## 2023-11-24 MED ORDER — NYSTATIN 100000 UNIT/GM EX CREA
1.0000 | TOPICAL_CREAM | Freq: Two times a day (BID) | CUTANEOUS | 0 refills | Status: DC
  Filled 2023-11-24: qty 60, 30d supply, fill #0

## 2023-11-27 ENCOUNTER — Other Ambulatory Visit (HOSPITAL_COMMUNITY): Payer: Self-pay

## 2023-11-28 ENCOUNTER — Encounter: Payer: Self-pay | Admitting: Physician Assistant

## 2023-11-28 ENCOUNTER — Other Ambulatory Visit (HOSPITAL_COMMUNITY)
Admission: RE | Admit: 2023-11-28 | Discharge: 2023-11-28 | Disposition: A | Discharge status: Home / Self Care | Payer: Medicaid Other | Source: Ambulatory Visit | Attending: Physician Assistant | Admitting: Physician Assistant

## 2023-11-28 ENCOUNTER — Ambulatory Visit: Payer: Medicaid Other | Admitting: Nurse Practitioner

## 2023-11-28 ENCOUNTER — Ambulatory Visit: Payer: Medicaid Other | Attending: Physician Assistant | Admitting: Physician Assistant

## 2023-11-28 ENCOUNTER — Other Ambulatory Visit: Payer: Self-pay

## 2023-11-28 VITALS — BP 123/82 | HR 76 | Wt 247.0 lb

## 2023-11-28 DIAGNOSIS — I10 Essential (primary) hypertension: Secondary | ICD-10-CM | POA: Diagnosis not present

## 2023-11-28 DIAGNOSIS — N898 Other specified noninflammatory disorders of vagina: Secondary | ICD-10-CM | POA: Diagnosis not present

## 2023-11-28 DIAGNOSIS — K047 Periapical abscess without sinus: Secondary | ICD-10-CM

## 2023-11-28 DIAGNOSIS — R7303 Prediabetes: Secondary | ICD-10-CM

## 2023-11-28 DIAGNOSIS — K219 Gastro-esophageal reflux disease without esophagitis: Secondary | ICD-10-CM | POA: Diagnosis not present

## 2023-11-28 DIAGNOSIS — L853 Xerosis cutis: Secondary | ICD-10-CM | POA: Diagnosis not present

## 2023-11-28 DIAGNOSIS — Z23 Encounter for immunization: Secondary | ICD-10-CM | POA: Diagnosis not present

## 2023-11-28 MED ORDER — CLOBETASOL PROPIONATE 0.05 % EX OINT
1.0000 | TOPICAL_OINTMENT | Freq: Two times a day (BID) | CUTANEOUS | 1 refills | Status: DC
  Filled 2023-11-28: qty 60, 30d supply, fill #0

## 2023-11-28 MED ORDER — PENICILLIN V POTASSIUM 500 MG PO TABS
500.0000 mg | ORAL_TABLET | Freq: Three times a day (TID) | ORAL | 0 refills | Status: AC
  Filled 2023-11-28 – 2023-11-29 (×2): qty 42, 14d supply, fill #0

## 2023-11-28 MED ORDER — AMLODIPINE BESYLATE 10 MG PO TABS
10.0000 mg | ORAL_TABLET | Freq: Every day | ORAL | 1 refills | Status: DC
  Filled 2023-11-28 – 2024-01-19 (×2): qty 90, 90d supply, fill #0
  Filled 2024-04-30: qty 90, 90d supply, fill #1

## 2023-11-28 MED ORDER — FLUCONAZOLE 150 MG PO TABS
150.0000 mg | ORAL_TABLET | Freq: Once | ORAL | 1 refills | Status: AC
  Filled 2023-11-28: qty 1, 1d supply, fill #0
  Filled 2023-11-29: qty 1, 1d supply, fill #1

## 2023-11-28 MED ORDER — LOSARTAN POTASSIUM 25 MG PO TABS
25.0000 mg | ORAL_TABLET | Freq: Every day | ORAL | 1 refills | Status: DC
  Filled 2023-11-28 – 2024-01-19 (×2): qty 90, 90d supply, fill #0
  Filled 2024-04-30: qty 90, 90d supply, fill #1

## 2023-11-28 MED ORDER — OMEPRAZOLE 20 MG PO CPDR
20.0000 mg | DELAYED_RELEASE_CAPSULE | Freq: Every day | ORAL | 3 refills | Status: DC
  Filled 2023-11-28 – 2023-12-22 (×2): qty 30, 30d supply, fill #0
  Filled 2024-01-19: qty 30, 30d supply, fill #1
  Filled 2024-04-30: qty 30, 30d supply, fill #2
  Filled 2024-05-28: qty 30, 30d supply, fill #3

## 2023-11-28 NOTE — Progress Notes (Signed)
Patient ID: Monica Brennan, female   DOB: 01/01/1976, 46 y.o.   MRN: [id number]   Monica Brennan, is a 47 y.o. female  XBJ:[id number]  HYQ:[id number]  DOB - 23-Dec-1975  Chief Complaint  Patient presents with   Hypertension   Medication Refill       Subjective:   Monica Brennan is a 47 y.o. female here today for medication RF.  She has also been having recurrent dental infections.  She has a dental appt in January to have debridement and surgery but she has had to go to UC/ED twice to get antibiotics and would like to have an Rx to keep on hand in case she needs them again.  And she is having vaginal itching and thinks she has yeast or BV.    No problems updated.  ALLERGIES: No Known Allergies  PAST MEDICAL HISTORY: Past Medical History:  Diagnosis Date   Hypertension Dx 2006   previously, on lisinopril. stopped lisinopril when medicaid ran out in 2007.    Morbid obesity (HCC)    Prediabetes     MEDICATIONS AT HOME: Prior to Admission medications   Medication Sig Start Date End Date Taking? Authorizing Provider  acetaminophen (TYLENOL) 500 MG tablet Take 2 tablets (1,000 mg total) by mouth every 8 (eight) hours for 14 days. 11/17/23 12/01/23 Yes Theadora Rama Scales, PA-C  clobetasol ointment (TEMOVATE) 0.05 % Apply 1 Application topically 2 (two) times daily. 11/28/23  Yes Georgian Co M, PA-C  cyclobenzaprine (FLEXERIL) 10 MG tablet Take 1 tablet (10 mg total) by mouth 3 (three) times daily as needed for muscle spasms. 11/24/23  Yes Hoy Register, MD  fluconazole (DIFLUCAN) 150 MG tablet Take 1 tablet (150 mg total) by mouth once for 1 dose. Repeat in 1 week if needed 11/28/23 11/29/23 Yes Islam Villescas, Marzella Schlein, PA-C  ibuprofen (ADVIL) 800 MG tablet Take 1 tablet (800 mg total) by mouth every 8 (eight) hours as needed for up to 14 days. 11/17/23 12/01/23 Yes Theadora Rama Scales, PA-C  lidocaine (XYLOCAINE) 2 % solution Use as directed 15 mLs in the mouth or throat  every 6 (six) hours as needed for mouth pain. 10/12/23  Yes Mound, Rolly Salter E, FNP  nystatin cream (MYCOSTATIN) Apply to breast area and groin 2 (two) times daily. 11/24/23  Yes Newlin, Odette Horns, MD  triamcinolone (KENALOG) 0.025 % ointment Apply 1 Application topically 2 (two) times daily. 11/24/23  Yes Hoy Register, MD  amLODipine (NORVASC) 10 MG tablet Take 1 tablet (10 mg total) by mouth daily. 11/28/23   Anders Simmonds, PA-C  losartan (COZAAR) 25 MG tablet Take 1 tablet (25 mg total) by mouth daily. 11/28/23   Anders Simmonds, PA-C  omeprazole (PRILOSEC) 20 MG capsule Take 1 capsule (20 mg total) by mouth daily. 11/28/23   Anders Simmonds, PA-C  penicillin v potassium (VEETID) 500 MG tablet Take 1 tablet (500 mg total) by mouth 3 (three) times daily for 14 days. 11/28/23 12/12/23  Anders Simmonds, PA-C    ROS: Neg resp Neg cardiac Neg GI Neg MS Neg psych Neg neuro  Objective:   Vitals:   11/28/23 0909  BP: 123/82  Pulse: 76  SpO2: 95%  Weight: 247 lb (112 kg)   Exam General appearance : Awake, alert, not in any distress. Speech Clear. Not toxic looking HEENT: Atraumatic and Normocephalic Neck: Supple, no JVD. No cervical lymphadenopathy.  Chest: Good air entry bilaterally, CTAB.  No rales/rhonchi/wheezing CVS: S1 S2 regular, no murmurs.  Extremities:  B/L Lower Ext shows no edema, both legs are warm to touch Neurology: Awake alert, and oriented X 3, CN II-XII intact, Non focal Skin: No Rash  Data Review Lab Results  Component Value Date   HGBA1C 5.9 (H) 02/15/2023   HGBA1C 5.8 (H) 10/18/2022   HGBA1C 5.6 02/18/2022    Assessment & Plan   1. Primary hypertension controlled - losartan (COZAAR) 25 MG tablet; Take 1 tablet (25 mg total) by mouth daily.  Dispense: 90 tablet; Refill: 1 - amLODipine (NORVASC) 10 MG tablet; Take 1 tablet (10 mg total) by mouth daily.  Dispense: 90 tablet; Refill: 1 - Comprehensive metabolic panel  2. Gastroesophageal reflux  disease, unspecified whether esophagitis present - omeprazole (PRILOSEC) 20 MG capsule; Take 1 capsule (20 mg total) by mouth daily.  Dispense: 30 capsule; Refill: 3  3. Needs flu shot (Primary) - Flu vaccine trivalent PF, 6mos and older(Flulaval,Afluria,Fluarix,Fluzone)  4. Dry skin dermatitis - clobetasol ointment (TEMOVATE) 0.05 %; Apply 1 Application topically 2 (two) times daily.  Dispense: 30 g; Refill: 1  5. Prediabetes I have had a lengthy discussion and provided education about insulin resistance and the intake of too much sugar/refined carbohydrates.  I have advised the patient to work at a goal of eliminating sugary drinks, candy, desserts, sweets, refined sugars, processed foods, and white carbohydrates.  The patient expresses understanding.  - Comprehensive metabolic panel - Hemoglobin A1c  6. Dental infection - penicillin v potassium (VEETID) 500 MG tablet; Take 1 tablet (500 mg total) by mouth 3 (three) times daily for 14 days.  Dispense: 42 tablet; Refill: 0  7. Vaginal itching - fluconazole (DIFLUCAN) 150 MG tablet; Take 1 tablet (150 mg total) by mouth once for 1 dose. Repeat in 1 week if needed  Dispense: 1 tablet; Refill: 1 - Cervicovaginal ancillary only    Return in about 6 months (around 05/28/2024) for PCP for chronic conditions-Monica Brennan.  The patient was given clear instructions to go to ER or return to medical center if symptoms don't improve, worsen or new problems develop. The patient verbalized understanding. The patient was told to call to get lab results if they haven't heard anything in the next week.      Georgian Co, PA-C [facility] and [facility] [place], [place] [phone]   11/28/2023, 9:48 AM

## 2023-11-28 NOTE — Patient Instructions (Signed)
Work at a goal of eliminating sugary drinks, candy, desserts, sweets, refined sugars, processed foods, and white carbohydrates.

## 2023-11-29 LAB — COMPREHENSIVE METABOLIC PANEL
ALT: 20 [IU]/L (ref 0–32)
AST: 16 [IU]/L (ref 0–40)
Albumin: 4.1 g/dL (ref 3.9–4.9)
Alkaline Phosphatase: 109 [IU]/L (ref 44–121)
BUN/Creatinine Ratio: 8 — ABNORMAL LOW (ref 9–23)
BUN: 7 mg/dL (ref 6–24)
Bilirubin Total: 0.2 mg/dL (ref 0.0–1.2)
CO2: 22 mmol/L (ref 20–29)
Calcium: 9.4 mg/dL (ref 8.7–10.2)
Chloride: 101 mmol/L (ref 96–106)
Creatinine, Ser: 0.84 mg/dL (ref 0.57–1.00)
Globulin, Total: 3.6 g/dL (ref 1.5–4.5)
Glucose: 96 mg/dL (ref 70–99)
Potassium: 4.3 mmol/L (ref 3.5–5.2)
Sodium: 138 mmol/L (ref 134–144)
Total Protein: 7.7 g/dL (ref 6.0–8.5)
eGFR: 85 mL/min/{1.73_m2} (ref >59)

## 2023-11-29 LAB — HEMOGLOBIN A1C
Est. average glucose Bld gHb Est-mCnc: 120 mg/dL
Hgb A1c MFr Bld: 5.8 % — ABNORMAL HIGH (ref 4.8–5.6)

## 2023-11-30 ENCOUNTER — Other Ambulatory Visit: Payer: Self-pay

## 2023-11-30 LAB — CERVICOVAGINAL ANCILLARY ONLY
Bacterial Vaginitis (gardnerella): NEGATIVE
Candida Glabrata: NEGATIVE
Candida Vaginitis: POSITIVE — AB
Chlamydia: NEGATIVE
Comment: NEGATIVE
Comment: NEGATIVE
Comment: NEGATIVE
Comment: NEGATIVE
Comment: NEGATIVE
Comment: NORMAL
Neisseria Gonorrhea: NEGATIVE
Trichomonas: NEGATIVE

## 2023-12-01 ENCOUNTER — Other Ambulatory Visit: Payer: Self-pay

## 2023-12-22 ENCOUNTER — Other Ambulatory Visit: Payer: Self-pay

## 2024-01-01 DIAGNOSIS — H5213 Myopia, bilateral: Secondary | ICD-10-CM | POA: Diagnosis not present

## 2024-01-19 ENCOUNTER — Other Ambulatory Visit: Payer: Self-pay

## 2024-01-19 ENCOUNTER — Other Ambulatory Visit: Payer: Self-pay | Admitting: Physician Assistant

## 2024-01-19 ENCOUNTER — Other Ambulatory Visit: Payer: Self-pay | Admitting: Family Medicine

## 2024-01-19 DIAGNOSIS — L853 Xerosis cutis: Secondary | ICD-10-CM

## 2024-01-19 DIAGNOSIS — G8929 Other chronic pain: Secondary | ICD-10-CM

## 2024-01-19 MED ORDER — CLOBETASOL PROPIONATE 0.05 % EX OINT
1.0000 | TOPICAL_OINTMENT | Freq: Two times a day (BID) | CUTANEOUS | 1 refills | Status: DC
  Filled 2024-01-19: qty 60, 30d supply, fill #0

## 2024-01-19 NOTE — Telephone Encounter (Signed)
Requested medication (s) are due for refill today: yes   Requested medication (s) are on the active medication list: yes   Last refill:  11/28/23 #30 g 1 refill  Future visit scheduled: yes in 4 months   Notes to clinic:  not delegated per protocol. Do you want to refill Rx?     Requested Prescriptions  Pending Prescriptions Disp Refills   clobetasol ointment (TEMOVATE) 0.05 % 30 g 1    Sig: Apply 1 Application topically 2 (two) times daily.     Not Delegated - Dermatology:  Corticosteroids Failed - 01/19/2024 11:08 AM      Failed - This refill cannot be delegated      Passed - Valid encounter within last 12 months    Recent Outpatient Visits           1 month ago Needs flu shot   [facility] [place]. [facility], [place], [place]   7 months ago Primary hypertension   [facility] [place] [place]. [facility] Claiborne Rigg, NP   11 months ago Primary hypertension   [facility] Of [place]. [facility] Claiborne Rigg, NP   1 year ago Chronic right-sided low back pain without sciatica   [facility] [place]. [facility] Claiborne Rigg, NP   1 year ago Tinea cruris   [facility] Of [place]. [facility] Claiborne Rigg, NP       Future Appointments             In 4 months Claiborne Rigg, NP [facility] - A Dept Of [facility]. [facility]

## 2024-01-19 NOTE — Telephone Encounter (Signed)
Requested medication (s) are due for refill today: na   Requested medication (s) are on the active medication list: yes   Last refill:  11/24/23 #90 0 refills  Future visit scheduled: yes in 4 months   Notes to clinic:  not delegated per protocol. Do you want to refill Rx?     Requested Prescriptions  Pending Prescriptions Disp Refills   cyclobenzaprine (FLEXERIL) 10 MG tablet 90 tablet 0    Sig: Take 1 tablet (10 mg total) by mouth 3 (three) times daily as needed for muscle spasms.     Not Delegated - Analgesics:  Muscle Relaxants Failed - 01/19/2024 11:15 AM      Failed - This refill cannot be delegated      Passed - Valid encounter within last 6 months    Recent Outpatient Visits           1 month ago Needs flu shot   [facility] [place] [facility] [place]. [facility], [place], [place]   7 months ago Primary hypertension   [facility] [place] [facility] Dept Of [place]. [facility] Claiborne Rigg, NP   11 months ago Primary hypertension   [facility] - [facility] Dept Of [place]. [facility] Claiborne Rigg, NP   1 year ago Chronic right-sided low back pain without sciatica   [place] [facility] - [facility] Dept Of [place]. [facility] Claiborne Rigg, NP   1 year ago Tinea cruris   [facility] - [facility] Dept Of [place]. [facility] Claiborne Rigg, NP       Future Appointments             In 4 months Claiborne Rigg, NP [facility] - A Dept Of [facility]. [facility]

## 2024-01-22 ENCOUNTER — Other Ambulatory Visit: Payer: Self-pay

## 2024-01-25 ENCOUNTER — Other Ambulatory Visit (HOSPITAL_COMMUNITY): Payer: Self-pay

## 2024-04-30 ENCOUNTER — Other Ambulatory Visit: Payer: Self-pay

## 2024-04-30 ENCOUNTER — Other Ambulatory Visit: Payer: Self-pay | Admitting: Physician Assistant

## 2024-04-30 ENCOUNTER — Other Ambulatory Visit: Payer: Self-pay | Admitting: Family Medicine

## 2024-04-30 DIAGNOSIS — L209 Atopic dermatitis, unspecified: Secondary | ICD-10-CM

## 2024-04-30 DIAGNOSIS — L853 Xerosis cutis: Secondary | ICD-10-CM

## 2024-04-30 DIAGNOSIS — G8929 Other chronic pain: Secondary | ICD-10-CM

## 2024-04-30 DIAGNOSIS — B356 Tinea cruris: Secondary | ICD-10-CM

## 2024-04-30 MED ORDER — TRIAMCINOLONE ACETONIDE 0.025 % EX OINT
1.0000 | TOPICAL_OINTMENT | Freq: Two times a day (BID) | CUTANEOUS | 0 refills | Status: DC
Start: 2024-04-30 — End: 2024-05-31
  Filled 2024-04-30: qty 30, 15d supply, fill #0
  Filled 2024-05-20: qty 30, 15d supply, fill #1

## 2024-04-30 MED ORDER — CYCLOBENZAPRINE HCL 10 MG PO TABS
10.0000 mg | ORAL_TABLET | Freq: Three times a day (TID) | ORAL | 0 refills | Status: DC | PRN
  Filled 2024-04-30: qty 90, 30d supply, fill #0

## 2024-04-30 MED ORDER — NYSTATIN 100000 UNIT/GM EX CREA
1.0000 | TOPICAL_CREAM | Freq: Two times a day (BID) | CUTANEOUS | 0 refills | Status: DC
Start: 2024-04-30 — End: 2024-05-31
  Filled 2024-04-30: qty 60, 30d supply, fill #0

## 2024-04-30 MED ORDER — CLOBETASOL PROPIONATE 0.05 % EX OINT
1.0000 | TOPICAL_OINTMENT | Freq: Two times a day (BID) | CUTANEOUS | 1 refills | Status: DC
Start: 2024-04-30 — End: 2024-05-31
  Filled 2024-04-30: qty 30, 15d supply, fill #0
  Filled 2024-05-20: qty 30, 15d supply, fill #1

## 2024-05-03 ENCOUNTER — Other Ambulatory Visit: Payer: Self-pay

## 2024-05-20 ENCOUNTER — Other Ambulatory Visit: Payer: Self-pay

## 2024-05-28 ENCOUNTER — Other Ambulatory Visit: Payer: Self-pay | Admitting: Family Medicine

## 2024-05-28 ENCOUNTER — Other Ambulatory Visit: Payer: Self-pay

## 2024-05-28 DIAGNOSIS — G8929 Other chronic pain: Secondary | ICD-10-CM

## 2024-05-28 MED ORDER — CYCLOBENZAPRINE HCL 10 MG PO TABS
10.0000 mg | ORAL_TABLET | Freq: Three times a day (TID) | ORAL | 0 refills | Status: DC | PRN
Start: 2024-05-28 — End: 2024-05-31
  Filled 2024-05-28: qty 30, 10d supply, fill #0

## 2024-05-28 NOTE — Telephone Encounter (Signed)
 Requested medication (s) are due for refill today - yes  Requested medication (s) are on the active medication list -yes  Future visit scheduled -yes  Last refill: 04/30/24 #90  Notes to clinic: non delegated Rx  Requested Prescriptions  Pending Prescriptions Disp Refills   cyclobenzaprine  (FLEXERIL ) 10 MG tablet 90 tablet 0    Sig: Take 1 tablet (10 mg total) by mouth 3 (three) times daily as needed for muscle spasms.     Not Delegated - Analgesics:  Muscle Relaxants Failed - 05/28/2024 11:45 AM      Failed - This refill cannot be delegated      Failed - Valid encounter within last 6 months    Recent Outpatient Visits           6 months ago Needs flu shot   [facility] [place]. [facility], Jon M, [place]   11 months ago Primary hypertension   [facility] [place]. [facility] Theotis Haze ORN, NP   1 year ago Primary hypertension   [facility] [place]. [facility] Theotis Haze ORN, NP   1 year ago Chronic right-sided low back pain without sciatica   [facility] [place]. [facility] Theotis Haze ORN, NP   1 year ago Tinea cruris   [facility] [place]. [facility] Theotis Haze ORN, NP       Future Appointments             In 3 days Theotis Haze ORN, NP [facility] - [facility] [place]. [facility]               Requested Prescriptions  Pending Prescriptions Disp Refills   cyclobenzaprine  (FLEXERIL ) 10 MG tablet 90 tablet 0    Sig: Take 1 tablet (10 mg total) by mouth 3 (three) times daily as needed for muscle spasms.     Not Delegated - Analgesics:  Muscle Relaxants Failed - 05/28/2024 11:45 AM      Failed - This refill cannot be delegated      Failed - Valid encounter within last 6 months    Recent Outpatient  Visits           6 months ago Needs flu shot   [facility] [place]. [facility], Jon M, [place]   11 months ago Primary hypertension   [facility] [place]. [facility] Theotis Haze ORN, NP   1 year ago Primary hypertension   [facility] - [facility] Of [place]. [facility] Theotis Haze ORN, NP   1 year ago Chronic right-sided low back pain without sciatica   [facility] [place]. [facility] Theotis Haze ORN, NP   1 year ago Tinea cruris   [facility] - [facility] Of [place]. [facility] Theotis Haze ORN, NP       Future Appointments             In 3 days Theotis Haze ORN, NP [facility] - [facility] [place]  [facility]

## 2024-05-31 ENCOUNTER — Other Ambulatory Visit: Payer: Self-pay

## 2024-05-31 ENCOUNTER — Ambulatory Visit: Payer: Medicaid Other | Attending: Nurse Practitioner | Admitting: Nurse Practitioner

## 2024-05-31 ENCOUNTER — Encounter: Payer: Self-pay | Admitting: Nurse Practitioner

## 2024-05-31 VITALS — BP 119/83 | HR 67 | Resp 19 | Ht 63.0 in | Wt 241.0 lb

## 2024-05-31 DIAGNOSIS — K5909 Other constipation: Secondary | ICD-10-CM | POA: Diagnosis not present

## 2024-05-31 DIAGNOSIS — R7303 Prediabetes: Secondary | ICD-10-CM | POA: Diagnosis not present

## 2024-05-31 DIAGNOSIS — E7841 Elevated Lipoprotein(a): Secondary | ICD-10-CM

## 2024-05-31 DIAGNOSIS — L853 Xerosis cutis: Secondary | ICD-10-CM

## 2024-05-31 DIAGNOSIS — K219 Gastro-esophageal reflux disease without esophagitis: Secondary | ICD-10-CM | POA: Diagnosis not present

## 2024-05-31 DIAGNOSIS — I10 Essential (primary) hypertension: Secondary | ICD-10-CM

## 2024-05-31 DIAGNOSIS — G8929 Other chronic pain: Secondary | ICD-10-CM

## 2024-05-31 DIAGNOSIS — Z23 Encounter for immunization: Secondary | ICD-10-CM

## 2024-05-31 DIAGNOSIS — Z1231 Encounter for screening mammogram for malignant neoplasm of breast: Secondary | ICD-10-CM

## 2024-05-31 DIAGNOSIS — M546 Pain in thoracic spine: Secondary | ICD-10-CM | POA: Diagnosis not present

## 2024-05-31 DIAGNOSIS — B356 Tinea cruris: Secondary | ICD-10-CM | POA: Diagnosis not present

## 2024-05-31 MED ORDER — NYSTATIN 100000 UNIT/GM EX CREA
1.0000 | TOPICAL_CREAM | Freq: Two times a day (BID) | CUTANEOUS | 3 refills | Status: AC
  Filled 2024-05-31 (×2): qty 30, 15d supply, fill #0
  Filled 2024-07-28: qty 30, 15d supply, fill #1
  Filled 2024-09-05: qty 60, 30d supply, fill #2
  Filled 2024-12-19: qty 60, 30d supply, fill #3

## 2024-05-31 MED ORDER — CYCLOBENZAPRINE HCL 10 MG PO TABS
10.0000 mg | ORAL_TABLET | Freq: Three times a day (TID) | ORAL | 2 refills | Status: AC | PRN
  Filled 2024-05-31 – 2024-07-28 (×2): qty 60, 20d supply, fill #0
  Filled 2024-09-05: qty 60, 20d supply, fill #1
  Filled 2024-12-19: qty 60, 20d supply, fill #2

## 2024-05-31 MED ORDER — LOSARTAN POTASSIUM 25 MG PO TABS
25.0000 mg | ORAL_TABLET | Freq: Every day | ORAL | 1 refills | Status: DC
  Filled 2024-05-31 – 2024-07-28 (×2): qty 90, 90d supply, fill #0

## 2024-05-31 MED ORDER — CLOBETASOL PROPIONATE 0.05 % EX OINT
1.0000 | TOPICAL_OINTMENT | Freq: Two times a day (BID) | CUTANEOUS | 1 refills | Status: DC
  Filled 2024-05-31: qty 60, 30d supply, fill #0
  Filled 2024-07-28: qty 60, 30d supply, fill #1

## 2024-05-31 MED ORDER — SENNA-DOCUSATE SODIUM 8.6-50 MG PO TABS
1.0000 | ORAL_TABLET | Freq: Every day | ORAL | 1 refills | Status: DC
  Filled 2024-05-31 – 2024-06-19 (×2): qty 180, 90d supply, fill #0
  Filled 2024-07-28: qty 180, 90d supply, fill #1

## 2024-05-31 MED ORDER — OMEPRAZOLE 20 MG PO CPDR
20.0000 mg | DELAYED_RELEASE_CAPSULE | Freq: Every day | ORAL | 1 refills | Status: DC
  Filled 2024-05-31 – 2024-07-28 (×2): qty 90, 90d supply, fill #0

## 2024-05-31 MED ORDER — AMLODIPINE BESYLATE 10 MG PO TABS
10.0000 mg | ORAL_TABLET | Freq: Every day | ORAL | 1 refills | Status: DC
  Filled 2024-05-31 – 2024-07-28 (×2): qty 90, 90d supply, fill #0

## 2024-05-31 NOTE — Progress Notes (Signed)
 Assessment & Plan:  Aunesty was seen today for hypertension.  Diagnoses and all orders for this visit:  Primary hypertension -     CMP14+EGFR -     amLODipine  (NORVASC ) 10 MG tablet; Take 1 tablet (10 mg total) by mouth daily. -     losartan  (COZAAR ) 25 MG tablet; Take 1 tablet (25 mg total) by mouth daily. Continue all antihypertensives as prescribed.  Reminded to bring in blood pressure log for follow  up appointment.  RECOMMENDATIONS: DASH/Mediterranean Diets are healthier choices for HTN.    Chronic left-sided thoracic back pain -     cyclobenzaprine  (FLEXERIL ) 10 MG tablet; Take 1 tablet (10 mg total) by mouth 3 (three) times daily as needed for muscle spasms. -     DG Lumbar Spine Complete; Future -     Urinalysis, Complete  Chronic constipation -     sennosides-docusate sodium  (SENOKOT-S) 8.6-50 MG tablet; Take 1-2 tablets by mouth daily.  Prediabetes A1c at goal and currently controlled with diet -     Hemoglobin A1c -     CMP14+EGFR  Dry skin dermatitis To use as needed for eczema and lichen planus -     clobetasol  ointment (TEMOVATE ) 0.05 %; Apply 1 Application topically 2 (two) times daily.  Tinea cruris To use under breast and groin due to body habitus -     nystatin  cream (MYCOSTATIN ); Apply to breast area and groin 2 (two) times daily.  Gastroesophageal reflux disease, unspecified whether esophagitis present -     omeprazole  (PRILOSEC) 20 MG capsule; Take 1 capsule (20 mg total) by mouth daily. INSTRUCTIONS: Avoid GERD Triggers: acidic, spicy or fried foods, caffeine, coffee, sodas,  alcohol and chocolate.    Need for hepatitis vaccination -     Heplisav-B (HepB-CPG) Vaccine  Need for shingles vaccine -     Zoster, Recombinant (Shingrix)  Elevated lipoprotein(a) -     Lipid panel INSTRUCTIONS: Work on a low fat, heart healthy diet and participate in regular aerobic exercise program by working out at least 150 minutes per week; 5 days a week-30 minutes  per day. Avoid red meat/beef/steak,  fried foods. junk foods, sodas, sugary drinks, unhealthy snacking, alcohol and smoking.  Drink at least 80 oz of water per day and monitor your carbohydrate intake daily.    Breast cancer screening by mammogram -     MM 3D SCREENING MAMMOGRAM BILATERAL BREAST; Future    Patient has been counseled on age-appropriate routine health concerns for screening and prevention. These are reviewed and up-to-date. Referrals have been placed accordingly. Immunizations are up-to-date or declined.    Subjective:   Chief Complaint  Patient presents with   Hypertension   Monica Brennan 48 y.o. female presents to office today for follow up to HTN   Patient has been counseled on age-appropriate routine health concerns for screening and prevention. These are reviewed and up-to-date. Referrals have been placed accordingly. Immunizations are up-to-date or declined.     MAMMOGRAM: UTD PAP SMEAR: UTD COLONOSCOPY: UTD  HTN  Blood pressure is well controlled.  Prescribed losartan  25 mg daily and amlodipine  10 mg daily. BP Readings from Last 3 Encounters:  05/31/24 119/83  11/28/23 123/82  11/17/23 (!) 151/83     She has chronic posterior left-side pain which is intermittent.  Worse with lying down. She also endorses chronic constipation. Denies any GU symptoms at this time.        Review of Systems  Constitutional:  Negative for  fever, malaise/fatigue and weight loss.  HENT: Negative.  Negative for nosebleeds.   Eyes: Negative.  Negative for blurred vision, double vision and photophobia.  Respiratory: Negative.  Negative for cough and shortness of breath.   Cardiovascular: Negative.  Negative for chest pain, palpitations and leg swelling.  Gastrointestinal:  Positive for constipation. Negative for abdominal pain, blood in stool, diarrhea, heartburn, melena, nausea and vomiting.  Musculoskeletal:  Positive for back pain and myalgias.  Neurological: Negative.   Negative for dizziness, focal weakness, seizures and headaches.  Psychiatric/Behavioral: Negative.  Negative for suicidal ideas.     Past Medical History:  Diagnosis Date   Hypertension Dx 2006   previously, on lisinopril. stopped lisinopril when medicaid ran out in 2007.    Morbid obesity (HCC)    Prediabetes     Past Surgical History:  Procedure Laterality Date   CESAREAN SECTION     x 3, 1998,2000, 2006   COLONOSCOPY     DILATION AND CURETTAGE, DIAGNOSTIC / THERAPEUTIC     due to miscarriage   FOOT SURGERY Left 04/2021   Bone spur   IR ANGIOGRAM PELVIS SELECTIVE OR SUPRASELECTIVE  11/03/2021   IR ANGIOGRAM PELVIS SELECTIVE OR SUPRASELECTIVE  11/03/2021   IR ANGIOGRAM SELECTIVE EACH ADDITIONAL VESSEL  11/03/2021   IR ANGIOGRAM SELECTIVE EACH ADDITIONAL VESSEL  11/03/2021   IR EMBO TUMOR ORGAN ISCHEMIA INFARCT INC GUIDE ROADMAPPING  11/03/2021   IR RADIOLOGIST EVAL & MGMT  09/22/2021   IR RADIOLOGIST EVAL & MGMT  11/18/2021   IR RADIOLOGIST EVAL & MGMT  03/03/2022   IR US  GUIDE VASC ACCESS LEFT  11/03/2021   TUBAL LIGATION  12/05/2004    Family History  Problem Relation Age of Onset   Hypertension Mother    Heart disease Mother    Sleep apnea Mother    Depression Mother    Hypertension Father    Hypertension Sister    Anxiety disorder Sister    Depression Sister    Hypertension Sister    Anxiety disorder Sister    Depression Sister    Diabetes Brother    Hypertension Brother    Anxiety disorder Brother    Depression Brother    Diabetes Brother    Hypertension Brother    Depression Brother    Hypertension Maternal Grandmother    ADD / ADHD Daughter    Depression Daughter    Diabetes Son    Thyroid disease Son    ADD / ADHD Son    Epilepsy Son    Autism Son    Diabetes Maternal Aunt    Cancer Maternal Aunt        lung    Heart defect Nephew    Colon cancer Neg Hx    Esophageal cancer Neg Hx    Rectal cancer Neg Hx    Stomach cancer Neg Hx      Social History Reviewed with no changes to be made today.   Outpatient Medications Prior to Visit  Medication Sig Dispense Refill   amLODipine  (NORVASC ) 10 MG tablet Take 1 tablet (10 mg total) by mouth daily. 90 tablet 1   clobetasol  ointment (TEMOVATE ) 0.05 % Apply 1 Application topically 2 (two) times daily. 30 g 1   cyclobenzaprine  (FLEXERIL ) 10 MG tablet Take 1 tablet (10 mg total) by mouth 3 (three) times daily as needed for muscle spasms.Please keep upcoming appt for additional refills. 30 tablet 0   lidocaine  (XYLOCAINE ) 2 % solution Use as directed 15 mLs in  the mouth or throat every 6 (six) hours as needed for mouth pain. (Patient not taking: Reported on 05/31/2024) 100 mL 0   losartan  (COZAAR ) 25 MG tablet Take 1 tablet (25 mg total) by mouth daily. 90 tablet 1   nystatin  cream (MYCOSTATIN ) Apply to breast area and groin 2 (two) times daily. 60 g 0   omeprazole  (PRILOSEC) 20 MG capsule Take 1 capsule (20 mg total) by mouth daily. 30 capsule 3   triamcinolone  (KENALOG ) 0.025 % ointment Apply 1 Application topically 2 (two) times daily. 60 g 0   No facility-administered medications prior to visit.    No Known Allergies     Objective:    BP 119/83 (BP Location: Left Arm, Patient Position: Sitting, Cuff Size: Normal)   Pulse 67   Resp 19   Ht 5' 3 (1.6 m)   Wt 241 lb (109.3 kg)   SpO2 100%   BMI 42.69 kg/m  Wt Readings from Last 3 Encounters:  05/31/24 241 lb (109.3 kg)  11/28/23 247 lb (112 kg)  06/13/23 239 lb (108.4 kg)    Physical Exam Vitals and nursing note reviewed.  Constitutional:      Appearance: She is well-developed.  HENT:     Head: Normocephalic and atraumatic.   Cardiovascular:     Rate and Rhythm: Normal rate and regular rhythm.     Heart sounds: Normal heart sounds. No murmur heard.    No friction rub. No gallop.  Pulmonary:     Effort: Pulmonary effort is normal. No tachypnea or respiratory distress.     Breath sounds: Normal breath  sounds. No decreased breath sounds, wheezing, rhonchi or rales.  Chest:     Chest wall: No tenderness.  Abdominal:     General: Bowel sounds are normal.     Palpations: Abdomen is soft.   Musculoskeletal:        General: Normal range of motion.     Cervical back: Normal range of motion.   Skin:    General: Skin is warm and dry.   Neurological:     Mental Status: She is alert and oriented to person, place, and time.     Coordination: Coordination normal.   Psychiatric:        Behavior: Behavior normal. Behavior is cooperative.        Thought Content: Thought content normal.        Judgment: Judgment normal.          Patient has been counseled extensively about nutrition and exercise as well as the importance of adherence with medications and regular follow-up. The patient was given clear instructions to go to ER or return to medical center if symptoms don't improve, worsen or new problems develop. The patient verbalized understanding.   Follow-up: Return in about 14 weeks (around 09/06/2024).   Haze LELON Servant, FNP-BC [facility] and [facility] [place], [place] [phone]   05/31/2024, 11:46 AM

## 2024-06-01 LAB — URINALYSIS, COMPLETE
Bilirubin, UA: NEGATIVE
Glucose, UA: NEGATIVE
Ketones, UA: NEGATIVE
Leukocytes,UA: NEGATIVE
Nitrite, UA: NEGATIVE
Protein,UA: NEGATIVE
Specific Gravity, UA: 1.015 (ref 1.005–1.030)
Urobilinogen, Ur: 0.2 mg/dL (ref 0.2–1.0)
pH, UA: 6.5 (ref 5.0–7.5)

## 2024-06-01 LAB — CMP14+EGFR
ALT: 18 IU/L (ref 0–32)
AST: 14 IU/L (ref 0–40)
Albumin: 4.3 g/dL (ref 3.9–4.9)
Alkaline Phosphatase: 110 IU/L (ref 44–121)
BUN/Creatinine Ratio: 7 — ABNORMAL LOW (ref 9–23)
BUN: 6 mg/dL (ref 6–24)
Bilirubin Total: 0.4 mg/dL (ref 0.0–1.2)
CO2: 22 mmol/L (ref 20–29)
Calcium: 9.5 mg/dL (ref 8.7–10.2)
Chloride: 102 mmol/L (ref 96–106)
Creatinine, Ser: 0.83 mg/dL (ref 0.57–1.00)
Globulin, Total: 3.4 g/dL (ref 1.5–4.5)
Glucose: 90 mg/dL (ref 70–99)
Potassium: 4.2 mmol/L (ref 3.5–5.2)
Sodium: 138 mmol/L (ref 134–144)
Total Protein: 7.7 g/dL (ref 6.0–8.5)
eGFR: 86 mL/min/{1.73_m2} (ref >59)

## 2024-06-01 LAB — LIPID PANEL
Chol/HDL Ratio: 4.5 ratio — ABNORMAL HIGH (ref 0.0–4.4)
Cholesterol, Total: 195 mg/dL (ref 100–199)
HDL: 43 mg/dL (ref >39)
LDL Chol Calc (NIH): 137 mg/dL — ABNORMAL HIGH (ref 0–99)
Triglycerides: 82 mg/dL (ref 0–149)
VLDL Cholesterol Cal: 15 mg/dL (ref 5–40)

## 2024-06-01 LAB — HEMOGLOBIN A1C
Est. average glucose Bld gHb Est-mCnc: 114 mg/dL
Hgb A1c MFr Bld: 5.6 % (ref 4.8–5.6)

## 2024-06-01 LAB — MICROSCOPIC EXAMINATION
Bacteria, UA: NONE SEEN
Casts: NONE SEEN /LPF
WBC, UA: NONE SEEN /HPF (ref 0–5)

## 2024-06-08 ENCOUNTER — Ambulatory Visit: Payer: Self-pay | Admitting: Nurse Practitioner

## 2024-06-14 ENCOUNTER — Ambulatory Visit
Admission: RE | Admit: 2024-06-14 | Discharge: 2024-06-14 | Disposition: A | Discharge status: Home / Self Care | Source: Ambulatory Visit | Attending: Nurse Practitioner

## 2024-06-14 DIAGNOSIS — M47816 Spondylosis without myelopathy or radiculopathy, lumbar region: Secondary | ICD-10-CM | POA: Diagnosis not present

## 2024-06-14 DIAGNOSIS — M419 Scoliosis, unspecified: Secondary | ICD-10-CM | POA: Diagnosis not present

## 2024-06-14 DIAGNOSIS — G8929 Other chronic pain: Secondary | ICD-10-CM

## 2024-06-17 ENCOUNTER — Other Ambulatory Visit: Payer: Self-pay | Admitting: Nurse Practitioner

## 2024-06-17 DIAGNOSIS — G8929 Other chronic pain: Secondary | ICD-10-CM

## 2024-06-19 ENCOUNTER — Other Ambulatory Visit: Payer: Self-pay

## 2024-06-20 ENCOUNTER — Other Ambulatory Visit: Payer: Self-pay

## 2024-07-26 ENCOUNTER — Ambulatory Visit
Admission: RE | Admit: 2024-07-26 | Discharge: 2024-07-26 | Disposition: A | Discharge status: Home / Self Care | Source: Ambulatory Visit | Attending: Nurse Practitioner | Admitting: Nurse Practitioner

## 2024-07-26 DIAGNOSIS — Z1231 Encounter for screening mammogram for malignant neoplasm of breast: Secondary | ICD-10-CM

## 2024-07-29 ENCOUNTER — Other Ambulatory Visit: Payer: Self-pay

## 2024-07-30 ENCOUNTER — Other Ambulatory Visit: Payer: Self-pay

## 2024-09-04 ENCOUNTER — Telehealth: Payer: Self-pay | Admitting: Nurse Practitioner

## 2024-09-04 NOTE — Telephone Encounter (Signed)
 Pt confirmed appt 9/26 (per vr )

## 2024-09-05 ENCOUNTER — Other Ambulatory Visit: Payer: Self-pay | Admitting: Nurse Practitioner

## 2024-09-05 ENCOUNTER — Other Ambulatory Visit: Payer: Self-pay

## 2024-09-05 DIAGNOSIS — K5909 Other constipation: Secondary | ICD-10-CM

## 2024-09-05 DIAGNOSIS — L853 Xerosis cutis: Secondary | ICD-10-CM

## 2024-09-06 ENCOUNTER — Other Ambulatory Visit: Payer: Self-pay

## 2024-09-06 ENCOUNTER — Ambulatory Visit: Attending: Nurse Practitioner | Admitting: Nurse Practitioner

## 2024-09-06 ENCOUNTER — Other Ambulatory Visit: Payer: Self-pay | Admitting: Nurse Practitioner

## 2024-09-06 ENCOUNTER — Encounter: Payer: Self-pay | Admitting: Nurse Practitioner

## 2024-09-06 VITALS — BP 123/85 | HR 75 | Resp 19 | Ht 63.0 in | Wt 252.8 lb

## 2024-09-06 DIAGNOSIS — I10 Essential (primary) hypertension: Secondary | ICD-10-CM | POA: Diagnosis not present

## 2024-09-06 DIAGNOSIS — K5909 Other constipation: Secondary | ICD-10-CM

## 2024-09-06 DIAGNOSIS — Z23 Encounter for immunization: Secondary | ICD-10-CM | POA: Diagnosis not present

## 2024-09-06 DIAGNOSIS — L853 Xerosis cutis: Secondary | ICD-10-CM

## 2024-09-06 DIAGNOSIS — K219 Gastro-esophageal reflux disease without esophagitis: Secondary | ICD-10-CM

## 2024-09-06 MED ORDER — OMEPRAZOLE 20 MG PO CPDR
20.0000 mg | DELAYED_RELEASE_CAPSULE | Freq: Every day | ORAL | 1 refills | Status: AC
  Filled 2024-09-06 – 2024-12-19 (×2): qty 90, 90d supply, fill #0

## 2024-09-06 MED ORDER — LOSARTAN POTASSIUM 25 MG PO TABS
25.0000 mg | ORAL_TABLET | Freq: Every day | ORAL | 1 refills | Status: AC
  Filled 2024-09-06 – 2024-12-19 (×2): qty 90, 90d supply, fill #0

## 2024-09-06 MED ORDER — SENNA-DOCUSATE SODIUM 8.6-50 MG PO TABS
1.0000 | ORAL_TABLET | Freq: Every day | ORAL | 1 refills | Status: AC
  Filled 2024-09-06 – 2024-12-19 (×2): qty 180, 90d supply, fill #0

## 2024-09-06 MED ORDER — AMLODIPINE BESYLATE 10 MG PO TABS
10.0000 mg | ORAL_TABLET | Freq: Every day | ORAL | 1 refills | Status: AC
  Filled 2024-09-06 – 2024-12-19 (×2): qty 90, 90d supply, fill #0

## 2024-09-06 NOTE — Progress Notes (Signed)
 Assessment & Plan:  Monica Brennan was seen today for hypertension.  Diagnoses and all orders for this visit:  Primary hypertension -     CMP14+EGFR -     amLODipine  (NORVASC ) 10 MG tablet; Take 1 tablet (10 mg total) by mouth daily. -     losartan  (COZAAR ) 25 MG tablet; Take 1 tablet (25 mg total) by mouth daily. Continue all antihypertensives as prescribed.  Reminded to bring in blood pressure log for follow  up appointment.  RECOMMENDATIONS: DASH/Mediterranean Diets are healthier choices for HTN.    Need for influenza vaccination -     Flu vaccine trivalent PF, 6mos and older(Flulaval,Afluria,Fluarix,Fluzone)  Gastroesophageal reflux disease, unspecified whether esophagitis present -     omeprazole  (PRILOSEC) 20 MG capsule; Take 1 capsule (20 mg total) by mouth daily. INSTRUCTIONS: Avoid GERD Triggers: acidic, spicy or fried foods, caffeine, coffee, sodas,  alcohol and chocolate.    Chronic constipation -     sennosides-docusate sodium  (SENOKOT-S) 8.6-50 MG tablet; Take 1-2 tablets by mouth daily.    Patient has been counseled on age-appropriate routine health concerns for screening and prevention. These are reviewed and up-to-date. Referrals have been placed accordingly. Immunizations are up-to-date or declined.    Subjective:   Chief Complaint  Patient presents with   Hypertension    Monica Brennan 48 y.o. female presents to office today for follow up to HTN.  She has a past medical history of Hypertension (Dx 2006), Morbid obesity (HCC), and Prediabetes.   Blood pressure is well-controlled.  She endorses adherence with amlodipine . BP Readings from Last 3 Encounters:  09/06/24 123/85  05/31/24 119/83  11/28/23 123/82    Symptoms of acid reflux and chronic constipation are being managed with stool softener and PPI.  Review of Systems  Constitutional:  Negative for fever, malaise/fatigue and weight loss.  HENT: Negative.  Negative for nosebleeds.   Eyes: Negative.   Negative for blurred vision, double vision and photophobia.  Respiratory: Negative.  Negative for cough and shortness of breath.   Cardiovascular: Negative.  Negative for chest pain, palpitations and leg swelling.  Gastrointestinal: Negative.  Negative for heartburn, nausea and vomiting.  Musculoskeletal: Negative.  Negative for myalgias.  Neurological: Negative.  Negative for dizziness, focal weakness, seizures and headaches.  Psychiatric/Behavioral: Negative.  Negative for suicidal ideas.     Past Medical History:  Diagnosis Date   Hypertension Dx 2006   previously, on lisinopril. stopped lisinopril when medicaid ran out in 2007.    Morbid obesity (HCC)    Prediabetes     Past Surgical History:  Procedure Laterality Date   CESAREAN SECTION     x 3, 1998,2000, 2006   COLONOSCOPY     DILATION AND CURETTAGE, DIAGNOSTIC / THERAPEUTIC     due to miscarriage   FOOT SURGERY Left 04/2021   Bone spur   IR ANGIOGRAM PELVIS SELECTIVE OR SUPRASELECTIVE  11/03/2021   IR ANGIOGRAM PELVIS SELECTIVE OR SUPRASELECTIVE  11/03/2021   IR ANGIOGRAM SELECTIVE EACH ADDITIONAL VESSEL  11/03/2021   IR ANGIOGRAM SELECTIVE EACH ADDITIONAL VESSEL  11/03/2021   IR EMBO TUMOR ORGAN ISCHEMIA INFARCT INC GUIDE ROADMAPPING  11/03/2021   IR RADIOLOGIST EVAL & MGMT  09/22/2021   IR RADIOLOGIST EVAL & MGMT  11/18/2021   IR RADIOLOGIST EVAL & MGMT  03/03/2022   IR US  GUIDE VASC ACCESS LEFT  11/03/2021   TUBAL LIGATION  12/05/2004    Family History  Problem Relation Age of Onset   Hypertension Mother  Heart disease Mother    Sleep apnea Mother    Depression Mother    Hypertension Father    Hypertension Sister    Anxiety disorder Sister    Depression Sister    Hypertension Sister    Anxiety disorder Sister    Depression Sister    ADD / ADHD Daughter    Depression Daughter    Diabetes Maternal Aunt    Cancer Maternal Aunt        lung    Hypertension Maternal Grandmother    Diabetes Brother     Hypertension Brother    Anxiety disorder Brother    Depression Brother    Diabetes Brother    Hypertension Brother    Depression Brother    Diabetes Son    Thyroid disease Son    ADD / ADHD Son    Epilepsy Son    Autism Son    Heart defect Nephew    Colon cancer Neg Hx    Esophageal cancer Neg Hx    Rectal cancer Neg Hx    Stomach cancer Neg Hx    Breast cancer Neg Hx     Social History Reviewed with no changes to be made today.   Outpatient Medications Prior to Visit  Medication Sig Dispense Refill   clobetasol  ointment (TEMOVATE ) 0.05 % Apply 1 Application topically 2 (two) times daily. 60 g 1   cyclobenzaprine  (FLEXERIL ) 10 MG tablet Take 1 tablet (10 mg total) by mouth 3 (three) times daily as needed for muscle spasms. 60 tablet 2   nystatin  cream (MYCOSTATIN ) Apply to breast area and groin 2 (two) times daily. 60 g 3   amLODipine  (NORVASC ) 10 MG tablet Take 1 tablet (10 mg total) by mouth daily. 90 tablet 1   losartan  (COZAAR ) 25 MG tablet Take 1 tablet (25 mg total) by mouth daily. 90 tablet 1   omeprazole  (PRILOSEC) 20 MG capsule Take 1 capsule (20 mg total) by mouth daily. 90 capsule 1   sennosides-docusate sodium  (SENOKOT-S) 8.6-50 MG tablet Take 1-2 tablets by mouth daily. 180 tablet 1   No facility-administered medications prior to visit.    No Known Allergies     Objective:    BP 123/85 (BP Location: Left Arm, Patient Position: Sitting, Cuff Size: Normal)   Pulse 75   Resp 19   Ht 5' 3 (1.6 m)   Wt 252 lb 12.8 oz (114.7 kg)   LMP 07/05/2024 (Approximate)   SpO2 100%   BMI 44.78 kg/m  Wt Readings from Last 3 Encounters:  09/06/24 252 lb 12.8 oz (114.7 kg)  05/31/24 241 lb (109.3 kg)  11/28/23 247 lb (112 kg)    Physical Exam Vitals and nursing note reviewed.  Constitutional:      Appearance: She is well-developed.  HENT:     Head: Normocephalic and atraumatic.  Cardiovascular:     Rate and Rhythm: Normal rate and regular rhythm.     Heart  sounds: Normal heart sounds. No murmur heard.    No friction rub. No gallop.  Pulmonary:     Effort: Pulmonary effort is normal. No tachypnea or respiratory distress.     Breath sounds: Normal breath sounds. No decreased breath sounds, wheezing, rhonchi or rales.  Chest:     Chest wall: No tenderness.  Musculoskeletal:        General: Normal range of motion.     Cervical back: Normal range of motion.  Skin:    General: Skin is warm and  dry.  Neurological:     Mental Status: She is alert and oriented to person, place, and time.     Coordination: Coordination normal.  Psychiatric:        Behavior: Behavior normal. Behavior is cooperative.        Thought Content: Thought content normal.        Judgment: Judgment normal.          Patient has been counseled extensively about nutrition and exercise as well as the importance of adherence with medications and regular follow-up. The patient was given clear instructions to go to ER or return to medical center if symptoms don't improve, worsen or new problems develop. The patient verbalized understanding.   Follow-up: Return for see me in 6 months. Nurse vist for shingles in 2-3 weeks..   Vidyuth Belsito W Natashia Roseman, FNP-BC [facility] and [facility] [place], [place] [phone]   09/06/2024, 10:17 AM

## 2024-09-07 LAB — CMP14+EGFR
ALT: 21 IU/L (ref 0–32)
AST: 15 IU/L (ref 0–40)
Albumin: 4.1 g/dL (ref 3.9–4.9)
Alkaline Phosphatase: 115 IU/L (ref 41–116)
BUN/Creatinine Ratio: 12 (ref 9–23)
BUN: 10 mg/dL (ref 6–24)
Bilirubin Total: 0.3 mg/dL (ref 0.0–1.2)
CO2: 26 mmol/L (ref 20–29)
Calcium: 9.9 mg/dL (ref 8.7–10.2)
Chloride: 100 mmol/L (ref 96–106)
Creatinine, Ser: 0.86 mg/dL (ref 0.57–1.00)
Globulin, Total: 3.4 g/dL (ref 1.5–4.5)
Glucose: 94 mg/dL (ref 70–99)
Potassium: 4.7 mmol/L (ref 3.5–5.2)
Sodium: 139 mmol/L (ref 134–144)
Total Protein: 7.5 g/dL (ref 6.0–8.5)
eGFR: 82 mL/min/1.73 (ref >59)

## 2024-09-09 ENCOUNTER — Ambulatory Visit: Payer: Self-pay | Admitting: Nurse Practitioner

## 2024-09-20 ENCOUNTER — Ambulatory Visit: Payer: Self-pay | Attending: Family Medicine

## 2024-09-20 DIAGNOSIS — R6889 Other general symptoms and signs: Secondary | ICD-10-CM

## 2024-09-20 DIAGNOSIS — Z23 Encounter for immunization: Secondary | ICD-10-CM

## 2024-09-20 NOTE — Progress Notes (Signed)
Shingrix vaccine administered per protocols.  Information sheet given. Patient denies and pain or discomfort at injection site. Tolerated injection well no reaction.

## 2024-10-03 ENCOUNTER — Other Ambulatory Visit: Payer: Self-pay

## 2024-12-19 ENCOUNTER — Other Ambulatory Visit: Payer: Self-pay

## 2024-12-19 ENCOUNTER — Telehealth: Payer: Self-pay | Admitting: Physician Assistant

## 2024-12-19 ENCOUNTER — Other Ambulatory Visit: Payer: Self-pay | Admitting: Nurse Practitioner

## 2024-12-19 DIAGNOSIS — H5710 Ocular pain, unspecified eye: Secondary | ICD-10-CM

## 2024-12-19 DIAGNOSIS — L853 Xerosis cutis: Secondary | ICD-10-CM

## 2024-12-19 MED ORDER — CLOBETASOL PROPIONATE 0.05 % EX OINT
1.0000 | TOPICAL_OINTMENT | Freq: Two times a day (BID) | CUTANEOUS | 1 refills | Status: AC
  Filled 2024-12-19: qty 60, 30d supply, fill #0

## 2024-12-19 NOTE — Progress Notes (Signed)
" °  Because of severe eye pain which raises concern for a corneal abrasion/ulcer or more unsubstantial infection, I feel your condition warrants further evaluation and I recommend that you be seen in a face-to-face visit.   NOTE: There will be NO CHARGE for this E-Visit   If you are having a true medical emergency, please call 911.     For an urgent face to face visit, [facility] has multiple urgent care centers for your convenience.  Click the link below for the full list of locations and hours, walk-in wait times, appointment scheduling options and driving directions:  Urgent Care - [place], [place], [place], [place], [place], [place]  [place]     Your MyChart E-visit questionnaire answers were reviewed by a board certified advanced clinical practitioner to complete your personal care plan based on your specific symptoms.    Thank you for using e-Visits.    "

## 2024-12-23 ENCOUNTER — Telehealth: Payer: Self-pay | Admitting: Nurse Practitioner

## 2024-12-23 NOTE — Telephone Encounter (Signed)
 Pt confirmed appt

## 2024-12-24 ENCOUNTER — Ambulatory Visit: Payer: Self-pay | Attending: Nurse Practitioner | Admitting: Nurse Practitioner

## 2024-12-24 ENCOUNTER — Encounter: Payer: Self-pay | Admitting: Nurse Practitioner

## 2024-12-24 ENCOUNTER — Other Ambulatory Visit: Payer: Self-pay

## 2024-12-24 VITALS — BP 146/94 | HR 85 | Ht 63.0 in | Wt 255.6 lb

## 2024-12-24 DIAGNOSIS — H109 Unspecified conjunctivitis: Secondary | ICD-10-CM | POA: Diagnosis not present

## 2024-12-24 DIAGNOSIS — M4180 Other forms of scoliosis, site unspecified: Secondary | ICD-10-CM

## 2024-12-24 DIAGNOSIS — R7303 Prediabetes: Secondary | ICD-10-CM | POA: Diagnosis not present

## 2024-12-24 DIAGNOSIS — Z23 Encounter for immunization: Secondary | ICD-10-CM | POA: Diagnosis not present

## 2024-12-24 DIAGNOSIS — E559 Vitamin D deficiency, unspecified: Secondary | ICD-10-CM

## 2024-12-24 DIAGNOSIS — B9689 Other specified bacterial agents as the cause of diseases classified elsewhere: Secondary | ICD-10-CM | POA: Diagnosis not present

## 2024-12-24 MED ORDER — POLYMYXIN B-TRIMETHOPRIM 10000-0.1 UNIT/ML-% OP SOLN
1.0000 [drp] | OPHTHALMIC | 0 refills | Status: AC
  Filled 2024-12-24: qty 10, 34d supply, fill #0

## 2024-12-24 MED ORDER — MELOXICAM 15 MG PO TABS
15.0000 mg | ORAL_TABLET | Freq: Every day | ORAL | 0 refills | Status: AC
  Filled 2024-12-24: qty 30, 30d supply, fill #0

## 2024-12-24 NOTE — Progress Notes (Unsigned)
 "  Assessment & Plan:  Monica Brennan was seen today for conjunctivitis.  Diagnoses and all orders for this visit:  Bacterial conjunctivitis of both eyes -     trimethoprim -polymyxin b  (POLYTRIM ) ophthalmic solution; Place 1 drop into both eyes every 4 (four) hours.  Need for hepatitis vaccination -     Heplisav-B  (HepB-CPG) Vaccine  Need for vaccination against Streptococcus pneumoniae -     Pneumococcal conjugate vaccine 20-valent (PCV20)  Levoscoliosis -     meloxicam  (MOBIC ) 15 MG tablet; Take 1 tablet (15 mg total) by mouth daily. -     DG SCOLIOSIS EVAL COMPLETE SPINE 2 OR 3 VIEWS; Future I do feel her weight is a major factor in her back pain as well. We discussed this today in office.  Vitamin D  deficiency disease -     VITAMIN D  25 Hydroxy (Vit-D Deficiency, Fractures)  Prediabetes -     Hemoglobin A1c    Patient has been counseled on age-appropriate routine health concerns for screening and prevention. These are reviewed and up-to-date. Referrals have been placed accordingly. Immunizations are up-to-date or declined.    Subjective:   Chief Complaint  Patient presents with   Conjunctivitis    Right eye. There is blurriness and drainage in right eye.    Monica Brennan 49 y.o. female presents to office today with complaints of bilateral eye redness.   She has a past medical history of Hypertension (Dx 2006), Morbid obesity (HCC), and Prediabetes.     Monica Brennan has been experiencing redness, matting, pain and crusting of both eyes. Initially started with the right eye but now present in both eyes. She had also experienced mild URI symptoms prior to the conjunctivitis starting.  Blood pressure is elevated today. She is currently prescribed losartan  and amlodipine . Taking both as prescribed.  BP Readings from Last 3 Encounters:  12/24/24 (!) 146/94  09/06/24 123/85  05/31/24 119/83     She has chronic low back pain without sciatica. BMI 45 Muscle relaxants and  prescription NSAIDs have been minimally effective.  Xray of lumbar spine 06-2024 Mild diffuse degenerative disc disease and facet hypertrophy. Broad-based levo scoliotic curvature  Review of Systems  Constitutional:  Negative for fever, malaise/fatigue and weight loss.  HENT: Negative.  Negative for nosebleeds.   Eyes:  Positive for pain, discharge and redness. Negative for blurred vision, double vision and photophobia.  Respiratory: Negative.  Negative for cough and shortness of breath.   Cardiovascular: Negative.  Negative for chest pain, palpitations and leg swelling.  Gastrointestinal: Negative.  Negative for heartburn, nausea and vomiting.  Musculoskeletal:  Positive for back pain and myalgias.  Neurological: Negative.  Negative for dizziness, focal weakness, seizures and headaches.  Psychiatric/Behavioral: Negative.  Negative for suicidal ideas.     Past Medical History:  Diagnosis Date   Hypertension Dx 2006   previously, on lisinopril. stopped lisinopril when medicaid ran out in 2007.    Morbid obesity (HCC)    Prediabetes     Past Surgical History:  Procedure Laterality Date   CESAREAN SECTION     x 3, 1998,2000, 2006   COLONOSCOPY     DILATION AND CURETTAGE, DIAGNOSTIC / THERAPEUTIC     due to miscarriage   FOOT SURGERY Left 04/2021   Bone spur   IR ANGIOGRAM PELVIS SELECTIVE OR SUPRASELECTIVE  11/03/2021   IR ANGIOGRAM PELVIS SELECTIVE OR SUPRASELECTIVE  11/03/2021   IR ANGIOGRAM SELECTIVE EACH ADDITIONAL VESSEL  11/03/2021   IR ANGIOGRAM SELECTIVE EACH  ADDITIONAL VESSEL  11/03/2021   IR EMBO TUMOR ORGAN ISCHEMIA INFARCT INC GUIDE ROADMAPPING  11/03/2021   IR RADIOLOGIST EVAL & MGMT  09/22/2021   IR RADIOLOGIST EVAL & MGMT  11/18/2021   IR RADIOLOGIST EVAL & MGMT  03/03/2022   IR US  GUIDE VASC ACCESS LEFT  11/03/2021   TUBAL LIGATION  12/05/2004    Family History  Problem Relation Age of Onset   Hypertension Mother    Heart disease Mother    Sleep apnea  Mother    Depression Mother    Hypertension Father    Hypertension Sister    Anxiety disorder Sister    Depression Sister    Hypertension Sister    Anxiety disorder Sister    Depression Sister    ADD / ADHD Daughter    Depression Daughter    Diabetes Maternal Aunt    Cancer Maternal Aunt        lung    Hypertension Maternal Grandmother    Diabetes Brother    Hypertension Brother    Anxiety disorder Brother    Depression Brother    Diabetes Brother    Hypertension Brother    Depression Brother    Diabetes Son    Thyroid disease Son    ADD / ADHD Son    Epilepsy Son    Autism Son    Heart defect Nephew    Colon cancer Neg Hx    Esophageal cancer Neg Hx    Rectal cancer Neg Hx    Stomach cancer Neg Hx    Breast cancer Neg Hx     Social History Reviewed with no changes to be made today.   Outpatient Medications Prior to Visit  Medication Sig Dispense Refill   amLODipine  (NORVASC ) 10 MG tablet Take 1 tablet (10 mg total) by mouth daily. 90 tablet 1   clobetasol  ointment (TEMOVATE ) 0.05 % Apply 1 Application topically 2 (two) times daily for 30 days. 60 g 1   cyclobenzaprine  (FLEXERIL ) 10 MG tablet Take 1 tablet (10 mg total) by mouth 3 (three) times daily as needed for muscle spasms. 60 tablet 2   losartan  (COZAAR ) 25 MG tablet Take 1 tablet (25 mg total) by mouth daily. 90 tablet 1   nystatin  cream (MYCOSTATIN ) Apply to breast area and groin 2 (two) times daily. 60 g 3   omeprazole  (PRILOSEC) 20 MG capsule Take 1 capsule (20 mg total) by mouth daily. 90 capsule 1   sennosides-docusate sodium  (SENOKOT-S) 8.6-50 MG tablet Take 1-2 tablets by mouth daily. 180 tablet 1   No facility-administered medications prior to visit.    Allergies[1]     Objective:    BP (!) 146/94 (BP Location: Left Arm, Patient Position: Sitting, Cuff Size: Normal)   Pulse 85   Ht 5' 3 (1.6 m)   Wt 255 lb 9.6 oz (115.9 kg)   SpO2 98%   BMI 45.28 kg/m  Wt Readings from Last 3 Encounters:   12/24/24 255 lb 9.6 oz (115.9 kg)  09/06/24 252 lb 12.8 oz (114.7 kg)  05/31/24 241 lb (109.3 kg)    Physical Exam Vitals and nursing note reviewed.  Constitutional:      Appearance: She is well-developed.  HENT:     Head: Normocephalic and atraumatic.  Eyes:     Extraocular Movements:     Right eye: Normal extraocular motion.     Left eye: Normal extraocular motion.     Conjunctiva/sclera:     Right eye: Right  conjunctiva is injected.     Left eye: Left conjunctiva is injected.  Cardiovascular:     Rate and Rhythm: Normal rate and regular rhythm.     Heart sounds: Normal heart sounds. No murmur heard.    No friction rub. No gallop.  Pulmonary:     Effort: Pulmonary effort is normal. No tachypnea or respiratory distress.     Breath sounds: Normal breath sounds. No decreased breath sounds, wheezing, rhonchi or rales.  Chest:     Chest wall: No tenderness.  Musculoskeletal:        General: Normal range of motion.     Cervical back: Normal range of motion.  Skin:    General: Skin is warm and dry.  Neurological:     Mental Status: She is alert and oriented to person, place, and time.     Coordination: Coordination normal.  Psychiatric:        Behavior: Behavior normal. Behavior is cooperative.        Thought Content: Thought content normal.        Judgment: Judgment normal.          Patient has been counseled extensively about nutrition and exercise as well as the importance of adherence with medications and regular follow-up. The patient was given clear instructions to go to ER or return to medical center if symptoms don't improve, worsen or new problems develop. The patient verbalized understanding.   Follow-up: Return in about 6 months (around 06/23/2025).   Haze LELON Servant, FNP-BC [facility] [place], [place] [phone]   12/26/2024, 8:49 AM     [1] No Known Allergies  "

## 2024-12-25 ENCOUNTER — Ambulatory Visit: Payer: Self-pay | Admitting: Nurse Practitioner

## 2024-12-25 ENCOUNTER — Other Ambulatory Visit: Payer: Self-pay | Admitting: Nurse Practitioner

## 2024-12-25 DIAGNOSIS — E559 Vitamin D deficiency, unspecified: Secondary | ICD-10-CM

## 2024-12-25 LAB — VITAMIN D 25 HYDROXY (VIT D DEFICIENCY, FRACTURES): Vit D, 25-Hydroxy: 8 ng/mL — ABNORMAL LOW (ref 30.0–100.0)

## 2024-12-25 LAB — HEMOGLOBIN A1C
Est. average glucose Bld gHb Est-mCnc: 120 mg/dL
Hgb A1c MFr Bld: 5.8 % — ABNORMAL HIGH (ref 4.8–5.6)

## 2024-12-25 MED ORDER — VITAMIN D (ERGOCALCIFEROL) 1.25 MG (50000 UNIT) PO CAPS
50000.0000 [IU] | ORAL_CAPSULE | ORAL | 1 refills | Status: AC
  Filled 2024-12-25: qty 12, 84d supply, fill #0

## 2024-12-26 ENCOUNTER — Other Ambulatory Visit: Payer: Self-pay

## 2024-12-26 ENCOUNTER — Encounter: Payer: Self-pay | Admitting: Nurse Practitioner

## 2024-12-26 DIAGNOSIS — Z23 Encounter for immunization: Secondary | ICD-10-CM

## 2024-12-26 NOTE — Progress Notes (Signed)
 Vaccines verified and documented.

## 2025-03-11 ENCOUNTER — Ambulatory Visit: Admitting: Nurse Practitioner

## 2025-06-23 ENCOUNTER — Ambulatory Visit: Admitting: Nurse Practitioner

## 2025-06-23 ENCOUNTER — Ambulatory Visit: Payer: Self-pay | Admitting: Nurse Practitioner
